# Patient Record
Sex: Male | Born: 1975 | Race: White | Hispanic: No | Smoking: Current every day smoker
Health system: Southern US, Community
[De-identification: ages and names within clinical notes are randomized; demographics above are authoritative.]

## PROBLEM LIST (undated history)

## (undated) DIAGNOSIS — J439 Emphysema, unspecified: Secondary | ICD-10-CM

## (undated) DIAGNOSIS — F17210 Nicotine dependence, cigarettes, uncomplicated: Secondary | ICD-10-CM

## (undated) DIAGNOSIS — C801 Malignant (primary) neoplasm, unspecified: Secondary | ICD-10-CM

## (undated) DIAGNOSIS — F988 Other specified behavioral and emotional disorders with onset usually occurring in childhood and adolescence: Secondary | ICD-10-CM

## (undated) DIAGNOSIS — J449 Chronic obstructive pulmonary disease, unspecified: Secondary | ICD-10-CM

## (undated) DIAGNOSIS — E669 Obesity, unspecified: Secondary | ICD-10-CM

## (undated) DIAGNOSIS — J45909 Unspecified asthma, uncomplicated: Secondary | ICD-10-CM

## (undated) HISTORY — DX: Other specified behavioral and emotional disorders with onset usually occurring in childhood and adolescence: F98.8

## (undated) HISTORY — PX: ANKLE SURGERY: SHX546

## (undated) HISTORY — PX: OTHER SURGICAL HISTORY: SHX169

---

## 1999-04-02 ENCOUNTER — Emergency Department (HOSPITAL_COMMUNITY): Admission: EM | Admit: 1999-04-02 | Discharge: 1999-04-02 | Payer: Self-pay

## 2000-11-22 ENCOUNTER — Encounter: Admission: RE | Admit: 2000-11-22 | Discharge: 2000-11-22 | Payer: Self-pay | Admitting: Family Medicine

## 2000-11-22 ENCOUNTER — Encounter: Payer: Self-pay | Admitting: Family Medicine

## 2002-03-09 ENCOUNTER — Emergency Department (HOSPITAL_COMMUNITY): Admission: EM | Admit: 2002-03-09 | Discharge: 2002-03-09 | Payer: Self-pay | Admitting: Emergency Medicine

## 2002-03-09 ENCOUNTER — Encounter: Payer: Self-pay | Admitting: Emergency Medicine

## 2004-03-30 ENCOUNTER — Emergency Department (HOSPITAL_COMMUNITY): Admission: EM | Admit: 2004-03-30 | Discharge: 2004-03-31 | Payer: Self-pay | Admitting: Emergency Medicine

## 2004-04-17 ENCOUNTER — Emergency Department (HOSPITAL_COMMUNITY): Admission: AC | Admit: 2004-04-17 | Discharge: 2004-04-18 | Payer: Self-pay

## 2007-05-05 ENCOUNTER — Emergency Department (HOSPITAL_COMMUNITY): Admission: EM | Admit: 2007-05-05 | Discharge: 2007-05-05 | Payer: Self-pay | Admitting: Emergency Medicine

## 2007-11-09 ENCOUNTER — Observation Stay (HOSPITAL_COMMUNITY): Admission: EM | Admit: 2007-11-09 | Discharge: 2007-11-10 | Payer: Self-pay | Admitting: Emergency Medicine

## 2007-11-18 ENCOUNTER — Emergency Department (HOSPITAL_COMMUNITY): Admission: EM | Admit: 2007-11-18 | Discharge: 2007-11-18 | Payer: Self-pay | Admitting: Emergency Medicine

## 2008-01-06 ENCOUNTER — Emergency Department (HOSPITAL_COMMUNITY): Admission: EM | Admit: 2008-01-06 | Discharge: 2008-01-06 | Payer: Self-pay | Admitting: Pediatrics

## 2008-02-15 ENCOUNTER — Emergency Department (HOSPITAL_COMMUNITY): Admission: EM | Admit: 2008-02-15 | Discharge: 2008-02-15 | Payer: Self-pay | Admitting: Emergency Medicine

## 2008-02-17 ENCOUNTER — Emergency Department (HOSPITAL_COMMUNITY): Admission: EM | Admit: 2008-02-17 | Discharge: 2008-02-17 | Payer: Self-pay | Admitting: Emergency Medicine

## 2009-05-11 ENCOUNTER — Observation Stay (HOSPITAL_COMMUNITY): Admission: EM | Admit: 2009-05-11 | Discharge: 2009-05-12 | Payer: Self-pay | Admitting: Emergency Medicine

## 2009-10-27 ENCOUNTER — Emergency Department (HOSPITAL_COMMUNITY): Admission: EM | Admit: 2009-10-27 | Discharge: 2009-10-28 | Payer: Self-pay | Admitting: Emergency Medicine

## 2010-05-03 ENCOUNTER — Ambulatory Visit: Payer: Self-pay | Admitting: Diagnostic Radiology

## 2010-05-03 ENCOUNTER — Emergency Department (HOSPITAL_BASED_OUTPATIENT_CLINIC_OR_DEPARTMENT_OTHER): Admission: EM | Admit: 2010-05-03 | Discharge: 2010-05-03 | Payer: Self-pay | Admitting: Emergency Medicine

## 2010-05-09 ENCOUNTER — Emergency Department (HOSPITAL_COMMUNITY): Admission: EM | Admit: 2010-05-09 | Discharge: 2010-05-09 | Payer: Self-pay | Admitting: Emergency Medicine

## 2010-08-20 ENCOUNTER — Inpatient Hospital Stay (HOSPITAL_COMMUNITY): Admission: EM | Admit: 2010-08-20 | Discharge: 2010-08-24 | Payer: Self-pay | Admitting: Emergency Medicine

## 2010-08-24 DIAGNOSIS — IMO0002 Reserved for concepts with insufficient information to code with codable children: Secondary | ICD-10-CM

## 2010-12-26 LAB — GLUCOSE, CAPILLARY
Glucose-Capillary: 107 mg/dL — ABNORMAL HIGH (ref 70–99)
Glucose-Capillary: 120 mg/dL — ABNORMAL HIGH (ref 70–99)
Glucose-Capillary: 126 mg/dL — ABNORMAL HIGH (ref 70–99)
Glucose-Capillary: 88 mg/dL (ref 70–99)
Glucose-Capillary: 96 mg/dL (ref 70–99)
Glucose-Capillary: 97 mg/dL (ref 70–99)

## 2010-12-26 LAB — BLOOD GAS, ARTERIAL
Acid-Base Excess: 1.7 mmol/L (ref 0.0–2.0)
Bicarbonate: 27.3 mEq/L — ABNORMAL HIGH (ref 20.0–24.0)
FIO2: 0.21 %
O2 Content: 4 L/min
O2 Saturation: 94.4 %
TCO2: 29.2 mmol/L (ref 0–100)
pCO2 arterial: 48.1 mmHg — ABNORMAL HIGH (ref 35.0–45.0)
pCO2 arterial: 57.1 mmHg (ref 35.0–45.0)
pO2, Arterial: 59.9 mmHg — ABNORMAL LOW (ref 80.0–100.0)
pO2, Arterial: 78.2 mmHg — ABNORMAL LOW (ref 80.0–100.0)

## 2010-12-26 LAB — BASIC METABOLIC PANEL
BUN: 11 mg/dL (ref 6–23)
BUN: 16 mg/dL (ref 6–23)
BUN: 8 mg/dL (ref 6–23)
CO2: 28 mEq/L (ref 19–32)
Calcium: 8.4 mg/dL (ref 8.4–10.5)
Chloride: 106 mEq/L (ref 96–112)
Chloride: 107 mEq/L (ref 96–112)
Creatinine, Ser: 1.17 mg/dL (ref 0.4–1.5)
Creatinine, Ser: 1.76 mg/dL — ABNORMAL HIGH (ref 0.4–1.5)
GFR calc Af Amer: >60 mL/min (ref >60)
GFR calc Af Amer: >60 mL/min (ref >60)
GFR calc non Af Amer: >60 mL/min (ref >60)
GFR calc non Af Amer: >60 mL/min (ref >60)
Glucose, Bld: 116 mg/dL — ABNORMAL HIGH (ref 70–99)
Potassium: 3.3 mEq/L — ABNORMAL LOW (ref 3.5–5.1)
Potassium: 4.3 mEq/L (ref 3.5–5.1)
Potassium: 5 mEq/L (ref 3.5–5.1)
Sodium: 140 mEq/L (ref 135–145)
Sodium: 141 mEq/L (ref 135–145)

## 2010-12-26 LAB — CBC
HCT: 43.6 % (ref 39.0–52.0)
MCH: 29.5 pg (ref 26.0–34.0)
MCH: 29.9 pg (ref 26.0–34.0)
MCHC: 31.5 g/dL (ref 30.0–36.0)
MCV: 93.2 fL (ref 78.0–100.0)
Platelets: 191 10*3/uL (ref 150–400)
Platelets: 214 10*3/uL (ref 150–400)
Platelets: 320 10*3/uL (ref 150–400)
RBC: 4.27 MIL/uL (ref 4.22–5.81)
RDW: 13.7 % (ref 11.5–15.5)
RDW: 13.7 % (ref 11.5–15.5)
WBC: 10.1 10*3/uL (ref 4.0–10.5)
WBC: 18.5 10*3/uL — ABNORMAL HIGH (ref 4.0–10.5)
WBC: 32.2 10*3/uL — ABNORMAL HIGH (ref 4.0–10.5)

## 2010-12-26 LAB — COMPREHENSIVE METABOLIC PANEL
AST: 54 U/L — ABNORMAL HIGH (ref 0–37)
CO2: 22 mEq/L (ref 19–32)
Calcium: 9.2 mg/dL (ref 8.4–10.5)
Creatinine, Ser: 2.62 mg/dL — ABNORMAL HIGH (ref 0.4–1.5)
GFR calc Af Amer: 34 mL/min — ABNORMAL LOW (ref >60)
GFR calc non Af Amer: 29 mL/min — ABNORMAL LOW (ref >60)
Sodium: 149 mEq/L — ABNORMAL HIGH (ref 135–145)
Total Protein: 9.2 g/dL — ABNORMAL HIGH (ref 6.0–8.3)

## 2010-12-26 LAB — DIFFERENTIAL
Basophils Relative: 0 % (ref 0–1)
Eosinophils Absolute: 0 10*3/uL (ref 0.0–0.7)
Lymphs Abs: 3.2 10*3/uL (ref 0.7–4.0)
Monocytes Absolute: 1 10*3/uL (ref 0.1–1.0)
Neutro Abs: 28 10*3/uL — ABNORMAL HIGH (ref 1.7–7.7)
Neutrophils Relative %: 87 % — ABNORMAL HIGH (ref 43–77)

## 2010-12-26 LAB — URINALYSIS, ROUTINE W REFLEX MICROSCOPIC
Bilirubin Urine: NEGATIVE
Specific Gravity, Urine: 1.02 (ref 1.005–1.030)
pH: 5.5 (ref 5.0–8.0)

## 2010-12-26 LAB — PROTIME-INR
INR: 1.07 (ref 0.00–1.49)
Prothrombin Time: 14.1 seconds (ref 11.6–15.2)

## 2010-12-26 LAB — URINE MICROSCOPIC-ADD ON

## 2010-12-26 LAB — ACETAMINOPHEN LEVEL: Acetaminophen (Tylenol), Serum: <10 ug/mL — ABNORMAL LOW (ref 10–30)

## 2010-12-26 LAB — LIPASE, BLOOD: Lipase: 35 U/L (ref 11–59)

## 2010-12-26 LAB — RAPID URINE DRUG SCREEN, HOSP PERFORMED
Amphetamines: NOT DETECTED
Cocaine: POSITIVE — AB
Opiates: NOT DETECTED
Tetrahydrocannabinol: POSITIVE — AB

## 2010-12-26 LAB — APTT: aPTT: 27 seconds (ref 24–37)

## 2010-12-26 LAB — LACTIC ACID, PLASMA: Lactic Acid, Venous: 11 mmol/L — ABNORMAL HIGH (ref 0.5–2.2)

## 2010-12-26 LAB — MRSA PCR SCREENING: MRSA by PCR: NEGATIVE

## 2010-12-26 LAB — BRAIN NATRIURETIC PEPTIDE: Pro B Natriuretic peptide (BNP): 76 pg/mL (ref 0.0–100.0)

## 2010-12-26 LAB — CK: Total CK: 134 U/L (ref 7–232)

## 2010-12-30 LAB — COMPREHENSIVE METABOLIC PANEL
AST: 17 U/L (ref 0–37)
Albumin: 4.3 g/dL (ref 3.5–5.2)
Alkaline Phosphatase: 110 U/L (ref 39–117)
BUN: 12 mg/dL (ref 6–23)
Chloride: 101 mEq/L (ref 96–112)
Creatinine, Ser: 1 mg/dL (ref 0.4–1.5)
GFR calc Af Amer: >60 mL/min (ref >60)
Potassium: 3.7 mEq/L (ref 3.5–5.1)
Total Bilirubin: 0.8 mg/dL (ref 0.3–1.2)
Total Protein: 8.2 g/dL (ref 6.0–8.3)

## 2010-12-30 LAB — DIFFERENTIAL
Basophils Absolute: 0.1 10*3/uL (ref 0.0–0.1)
Eosinophils Relative: 3 % (ref 0–5)
Lymphocytes Relative: 29 % (ref 12–46)
Monocytes Absolute: 0.5 10*3/uL (ref 0.1–1.0)
Monocytes Relative: 7 % (ref 3–12)
Neutro Abs: 4.6 10*3/uL (ref 1.7–7.7)

## 2010-12-30 LAB — POCT CARDIAC MARKERS
CKMB, poc: <1 ng/mL — ABNORMAL LOW (ref 1.0–8.0)
Myoglobin, poc: 45.9 ng/mL (ref 12–200)

## 2010-12-30 LAB — CBC
Platelets: 253 10*3/uL (ref 150–400)
RBC: 5.25 MIL/uL (ref 4.22–5.81)
RDW: 12.4 % (ref 11.5–15.5)
WBC: 7.6 10*3/uL (ref 4.0–10.5)

## 2011-01-21 LAB — DIFFERENTIAL
Basophils Absolute: 0 10*3/uL (ref 0.0–0.1)
Basophils Relative: 1 % (ref 0–1)
Lymphocytes Relative: 24 % (ref 12–46)
Monocytes Absolute: 0.8 10*3/uL (ref 0.1–1.0)
Neutro Abs: 5.2 10*3/uL (ref 1.7–7.7)
Neutrophils Relative %: 61 % (ref 43–77)

## 2011-01-21 LAB — CBC
Hemoglobin: 13.1 g/dL (ref 13.0–17.0)
MCHC: 34.1 g/dL (ref 30.0–36.0)
Platelets: 213 10*3/uL (ref 150–400)
RDW: 12.6 % (ref 11.5–15.5)

## 2011-01-21 LAB — BASIC METABOLIC PANEL
BUN: 12 mg/dL (ref 6–23)
CO2: 29 mEq/L (ref 19–32)
Calcium: 9.1 mg/dL (ref 8.4–10.5)
Creatinine, Ser: 1.26 mg/dL (ref 0.4–1.5)
GFR calc non Af Amer: >60 mL/min (ref >60)
Glucose, Bld: 104 mg/dL — ABNORMAL HIGH (ref 70–99)

## 2011-01-21 LAB — CULTURE, BLOOD (ROUTINE X 2)
Culture: NO GROWTH
Culture: NO GROWTH

## 2011-02-27 NOTE — Op Note (Signed)
NAME:  KAPENA, HAMME NO.:  [id number]   MEDICAL RECORD NO.:  [id number]          PATIENT TYPE:  INP   LOCATION:  2550                         FACILITY:  [facility]   PHYSICIAN:  Dyke Brackett, M.D.    DATE OF BIRTH:  Mar 20, 1976   DATE OF PROCEDURE:  11/09/2007  DATE OF DISCHARGE:                               OPERATIVE REPORT   PREOPERATIVE DIAGNOSES:  1. Displaced Weber type C fibular fracture.  2. Disruption of syndesmosis and deltoid.   POSTOPERATIVE DIAGNOSES:  1. Displaced Weber type C fibular fracture.  2. Disruption of syndesmosis and deltoid.   OPERATION PERFORMED:  1. Open reduction and internal fixation of Weber type C fibular      fracture.  2. Insertion of two syndesmotic screws.   SURGEON:  Dyke Brackett, M.D.   ASSISTANT:  Arlys John D. Petrarca, P. A.-C.   TOURNIQUET TIME:  Approximately 1 hour.   DESCRIPTION OF THE OPERATION:  Provisional reduction of this fracture  dislocation had been done in the emergency room.  There was residual  widening of the medial space of the medial deltoid with good improvement  though on the provisional reduction.  He was approached through an  extended lateral incision due to the high nature of the fibular  fracture.  The fibula was exposed.  There was an oblique fracture, which  was comminuted enough that I did not think interfragmentary screw  compression was with a good idea.  Approximately a 9-10 hole plate was  placed.  One to two holes on the plate could not be used due to  comminution around the fracture site with excellent purchase probably at  least three screws above the fracture site and two cortical and one  cancellus screw distal to the fracture site were placed.  This  provisionally reduced the fracture very nicely with excellent  improvement.  There was a small trimalleolar component of this actually  that was __________  with a small posterior lip fracture that did not  require fixation.   Once  the plate was fixed and shown to be in good position two 4.5  cortical screws were placed at the level about the old fascial scar,  the first one, and then the second one above that to have reduction of  the syndesmosis as well as complete resolution relative to the medial  deltoid space being equalized on the mortise view.  The wound was  irrigated.  The sheath over the peroneal tendons, which had been opened  for exposure of the fracture, was closed with interrupted 0-Vicryl, in  the subcutaneous tissue 2-0 Vicryl and skin clips.  Marcaine with  epinephrine was infiltrated in the skin.  A bulky light compressive  sterile dressing with posterior splint were applied.   The patient was taken to recovery room in stable condition.   C-arm fluoroscopy confirmed essentially an anatomic reduction in AP,  mortise and lateral views.   The tourniquet was released after the surgery and before application of  the dressing.      Dyke Brackett, M.D.  Electronically Signed  WDC/MEDQ  D:  11/09/2007  T:  11/10/2007  Job:  [id number]

## 2011-07-05 LAB — BASIC METABOLIC PANEL
CO2: 33 — ABNORMAL HIGH
Calcium: 8.6
Creatinine, Ser: 1.05
Glucose, Bld: 116 — ABNORMAL HIGH

## 2011-07-05 LAB — I-STAT 8, (EC8 V) (CONVERTED LAB)
Chloride: 104
Glucose, Bld: 98
Potassium: 3.9
TCO2: 30
pCO2, Ven: 47.9
pH, Ven: 7.391 — ABNORMAL HIGH

## 2011-07-05 LAB — RAPID URINE DRUG SCREEN, HOSP PERFORMED
Barbiturates: NOT DETECTED
Benzodiazepines: POSITIVE — AB
Cocaine: NOT DETECTED

## 2011-07-05 LAB — POCT I-STAT CREATININE: Operator id: 272551

## 2011-07-05 LAB — CBC
MCHC: 33.8
MCHC: 34
MCV: 86.3
Platelets: 226
RBC: 5
RDW: 13.5
RDW: 13.5

## 2011-07-30 LAB — URINALYSIS, ROUTINE W REFLEX MICROSCOPIC
Glucose, UA: >1000 — AB
Leukocytes, UA: NEGATIVE
Protein, ur: NEGATIVE
Urobilinogen, UA: 0.2

## 2011-07-30 LAB — POCT I-STAT CREATININE: Operator id: 146091

## 2011-07-30 LAB — HEPATIC FUNCTION PANEL
Albumin: 3.7
Alkaline Phosphatase: 91
Total Protein: 7.7

## 2011-07-30 LAB — I-STAT 8, (EC8 V) (CONVERTED LAB)
Bicarbonate: 29.2 — ABNORMAL HIGH
Glucose, Bld: 72
TCO2: 31
pCO2, Ven: 59.8 — ABNORMAL HIGH
pH, Ven: 7.297

## 2011-07-30 LAB — RAPID URINE DRUG SCREEN, HOSP PERFORMED
Amphetamines: NOT DETECTED
Benzodiazepines: POSITIVE — AB
Tetrahydrocannabinol: POSITIVE — AB

## 2011-07-30 LAB — ETHANOL: Alcohol, Ethyl (B): <5

## 2011-07-30 LAB — URINE MICROSCOPIC-ADD ON

## 2012-03-26 ENCOUNTER — Emergency Department (HOSPITAL_COMMUNITY): Payer: Self-pay

## 2012-03-26 ENCOUNTER — Encounter (HOSPITAL_COMMUNITY): Payer: Self-pay | Admitting: Emergency Medicine

## 2012-03-26 ENCOUNTER — Emergency Department (HOSPITAL_COMMUNITY)
Admission: EM | Admit: 2012-03-26 | Discharge: 2012-03-26 | Disposition: A | Discharge status: Home / Self Care | Payer: No Typology Code available for payment source | Attending: Emergency Medicine | Admitting: Emergency Medicine

## 2012-03-26 ENCOUNTER — Emergency Department (HOSPITAL_COMMUNITY): Payer: No Typology Code available for payment source

## 2012-03-26 DIAGNOSIS — S81012A Laceration without foreign body, left knee, initial encounter: Secondary | ICD-10-CM

## 2012-03-26 DIAGNOSIS — M79609 Pain in unspecified limb: Secondary | ICD-10-CM | POA: Insufficient documentation

## 2012-03-26 DIAGNOSIS — T07XXXA Unspecified multiple injuries, initial encounter: Secondary | ICD-10-CM

## 2012-03-26 DIAGNOSIS — S81809A Unspecified open wound, unspecified lower leg, initial encounter: Secondary | ICD-10-CM | POA: Insufficient documentation

## 2012-03-26 DIAGNOSIS — F1010 Alcohol abuse, uncomplicated: Secondary | ICD-10-CM | POA: Insufficient documentation

## 2012-03-26 DIAGNOSIS — S81009A Unspecified open wound, unspecified knee, initial encounter: Secondary | ICD-10-CM | POA: Insufficient documentation

## 2012-03-26 DIAGNOSIS — R109 Unspecified abdominal pain: Secondary | ICD-10-CM | POA: Insufficient documentation

## 2012-03-26 DIAGNOSIS — E669 Obesity, unspecified: Secondary | ICD-10-CM | POA: Insufficient documentation

## 2012-03-26 DIAGNOSIS — Y92410 Unspecified street and highway as the place of occurrence of the external cause: Secondary | ICD-10-CM | POA: Insufficient documentation

## 2012-03-26 DIAGNOSIS — R51 Headache: Secondary | ICD-10-CM | POA: Insufficient documentation

## 2012-03-26 HISTORY — DX: Unspecified asthma, uncomplicated: J45.909

## 2012-03-26 LAB — PROTIME-INR: INR: 0.96 (ref 0.00–1.49)

## 2012-03-26 LAB — CBC
MCH: 28.8 pg (ref 26.0–34.0)
Platelets: 199 10*3/uL (ref 150–400)
RBC: 5.28 MIL/uL (ref 4.22–5.81)
WBC: 8.3 10*3/uL (ref 4.0–10.5)

## 2012-03-26 LAB — BASIC METABOLIC PANEL
Calcium: 9.4 mg/dL (ref 8.4–10.5)
GFR calc Af Amer: >90 mL/min (ref >90)
GFR calc non Af Amer: >90 mL/min (ref >90)
Sodium: 135 mEq/L (ref 135–145)

## 2012-03-26 LAB — LACTIC ACID, PLASMA: Lactic Acid, Venous: 4.5 mmol/L — ABNORMAL HIGH (ref 0.5–2.2)

## 2012-03-26 LAB — ETHANOL: Alcohol, Ethyl (B): 190 mg/dL — ABNORMAL HIGH (ref 0–11)

## 2012-03-26 MED ORDER — IOHEXOL 300 MG/ML  SOLN
100.0000 mL | Freq: Once | INTRAMUSCULAR | Status: AC | PRN
  Administered 2012-03-26: 100 mL via INTRAVENOUS

## 2012-03-26 MED ORDER — MORPHINE SULFATE 4 MG/ML IJ SOLN
4.0000 mg | Freq: Once | INTRAMUSCULAR | Status: AC
  Administered 2012-03-26: 4 mg via INTRAVENOUS
  Filled 2012-03-26: qty 1

## 2012-03-26 MED ORDER — ONDANSETRON HCL 4 MG/2ML IJ SOLN
INTRAMUSCULAR | Status: AC
  Filled 2012-03-26: qty 2

## 2012-03-26 MED ORDER — TETANUS-DIPHTH-ACELL PERTUSSIS 5-2.5-18.5 LF-MCG/0.5 IM SUSP
INTRAMUSCULAR | Status: AC
  Administered 2012-03-26: 0.5 mL via INTRAMUSCULAR
  Filled 2012-03-26: qty 0.5

## 2012-03-26 MED ORDER — LORAZEPAM 1 MG PO TABS: 1.0000 mg | ORAL_TABLET | Freq: Once | ORAL | Status: DC

## 2012-03-26 MED ORDER — OXYCODONE-ACETAMINOPHEN 5-325 MG PO TABS: 2.0000 | ORAL_TABLET | ORAL | Status: AC | PRN

## 2012-03-26 MED ORDER — ONDANSETRON HCL 4 MG/2ML IJ SOLN
4.0000 mg | Freq: Once | INTRAMUSCULAR | Status: AC
  Administered 2012-03-26: 4 mg via INTRAVENOUS

## 2012-03-26 MED ORDER — LORAZEPAM 2 MG/ML IJ SOLN
1.0000 mg | Freq: Once | INTRAMUSCULAR | Status: AC
  Administered 2012-03-26 (×2): 1 mg via INTRAVENOUS

## 2012-03-26 MED ORDER — CEPHALEXIN 500 MG PO CAPS: 500.0000 mg | ORAL_CAPSULE | Freq: Four times a day (QID) | ORAL | Status: AC

## 2012-03-26 MED ORDER — LORAZEPAM 2 MG/ML IJ SOLN
INTRAMUSCULAR | Status: AC
  Administered 2012-03-26: 1 mg via INTRAVENOUS
  Filled 2012-03-26: qty 1

## 2012-03-26 MED ORDER — OXYCODONE-ACETAMINOPHEN 5-325 MG PO TABS
2.0000 | ORAL_TABLET | Freq: Once | ORAL | Status: AC
  Administered 2012-03-26: 2 via ORAL
  Filled 2012-03-26: qty 2

## 2012-03-26 MED ORDER — KETOROLAC TROMETHAMINE 30 MG/ML IJ SOLN
30.0000 mg | Freq: Once | INTRAMUSCULAR | Status: AC
  Administered 2012-03-26: 30 mg via INTRAVENOUS
  Filled 2012-03-26: qty 1

## 2012-03-26 MED ORDER — TETANUS-DIPHTH-ACELL PERTUSSIS 5-2.5-18.5 LF-MCG/0.5 IM SUSP
0.5000 mL | Freq: Once | INTRAMUSCULAR | Status: AC
  Administered 2012-03-26: 0.5 mL via INTRAMUSCULAR
  Filled 2012-03-26: qty 0.5

## 2012-03-26 MED ORDER — METHYLENE BLUE 1 % INJ SOLN
1.0000 mL | Freq: Once | INTRAMUSCULAR | Status: AC
  Administered 2012-03-26: 10 mg via INTRAVENOUS

## 2012-03-26 MED ORDER — METHYLENE BLUE 1 % INJ SOLN
1.0000 mL | Freq: Once | INTRAMUSCULAR | Status: DC
  Filled 2012-03-26 (×2): qty 10

## 2012-03-26 MED ORDER — ONDANSETRON 4 MG PO TBDP
8.0000 mg | ORAL_TABLET | Freq: Once | ORAL | Status: AC
  Administered 2012-03-26: 8 mg via ORAL
  Filled 2012-03-26: qty 2

## 2012-03-26 MED ORDER — HYDROMORPHONE HCL PF 1 MG/ML IJ SOLN
1.0000 mg | Freq: Once | INTRAMUSCULAR | Status: AC
  Administered 2012-03-26: 1 mg via INTRAVENOUS
  Filled 2012-03-26: qty 1
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
## 2012-03-26 NOTE — ED Provider Notes (Signed)
Medical screening examination/treatment/procedure(s) were performed by non-physician practitioner and as supervising physician I was immediately available for consultation/collaboration.  Olivia Mackie, MD 03/26/12 [phone]

## 2012-03-26 NOTE — Progress Notes (Signed)
Orthopedic Tech Progress Note Patient Details:  Danny Hale 1976/09/29 [id number]  Ortho Devices Type of Ortho Device: Knee Immobilizer;Crutches Ortho Device/Splint Interventions: Application;Adjustment   Shawnie Pons 03/26/2012, 7:45 AM

## 2012-03-26 NOTE — ED Provider Notes (Signed)
Danny Hale S Patient discussed with attending physician. Will assist with repair of laceration to left anterior knee.   LACERATION REPAIR Performed by: Angus Seller Authorized by: Angus Seller Consent: Verbal consent obtained. Risks and benefits: risks, benefits and alternatives were discussed Consent given by: patient Patient identity confirmed: provided demographic data Prepped and Draped in normal sterile fashion Wound explored  Laceration Location: Left inferior knee  Laceration Length: 8 cm  No Foreign Bodies seen or palpated  Anesthesia: local infiltration  Local anesthetic: lidocaine 2 % with epinephrine  Anesthetic total: 20 ml  Irrigation method: syringe with 2 L of fluid  Amount of cleaning: Extensive with addition of Betadine scrub   Skin closure: Subcutaneous with 3-0 Vicryl, Skin with 3-0 nylon   Number of sutures: 3 Vicryl, 10 nylon   Technique: Horizontal mattress and simple interrupted   Patient tolerance: Patient tolerated the procedure well with no immediate complications.   Angus Seller, [place] 03/26/12 [phone]

## 2012-04-18 ENCOUNTER — Emergency Department (HOSPITAL_COMMUNITY)
Admission: EM | Admit: 2012-04-18 | Discharge: 2012-04-18 | Disposition: A | Discharge status: Home / Self Care | Payer: No Typology Code available for payment source | Attending: Emergency Medicine | Admitting: Emergency Medicine

## 2012-04-18 ENCOUNTER — Emergency Department (HOSPITAL_COMMUNITY): Payer: No Typology Code available for payment source

## 2012-04-18 ENCOUNTER — Encounter (HOSPITAL_COMMUNITY): Payer: Self-pay | Admitting: Emergency Medicine

## 2012-04-18 DIAGNOSIS — F17200 Nicotine dependence, unspecified, uncomplicated: Secondary | ICD-10-CM | POA: Insufficient documentation

## 2012-04-18 DIAGNOSIS — S43499A Other sprain of unspecified shoulder joint, initial encounter: Secondary | ICD-10-CM | POA: Insufficient documentation

## 2012-04-18 DIAGNOSIS — X503XXA Overexertion from repetitive movements, initial encounter: Secondary | ICD-10-CM | POA: Insufficient documentation

## 2012-04-18 DIAGNOSIS — J45909 Unspecified asthma, uncomplicated: Secondary | ICD-10-CM | POA: Insufficient documentation

## 2012-04-18 DIAGNOSIS — T148XXA Other injury of unspecified body region, initial encounter: Secondary | ICD-10-CM

## 2012-04-18 DIAGNOSIS — S46819A Strain of other muscles, fascia and tendons at shoulder and upper arm level, unspecified arm, initial encounter: Secondary | ICD-10-CM | POA: Insufficient documentation

## 2012-04-18 MED ORDER — HYDROCODONE-ACETAMINOPHEN 5-325 MG PO TABS
1.0000 | ORAL_TABLET | Freq: Once | ORAL | Status: AC
  Administered 2012-04-18: 1 via ORAL
  Filled 2012-04-18: qty 1

## 2012-04-18 MED ORDER — HYDROCODONE-ACETAMINOPHEN 5-325 MG PO TABS: 1.0000 | ORAL_TABLET | ORAL | Status: AC | PRN

## 2012-04-18 NOTE — ED Notes (Signed)
Pt's acuity increased due to HR.  [name], EMT verified HR manually.

## 2012-04-18 NOTE — ED Notes (Signed)
Paged ortho tech for left wrist splint 
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
## 2012-04-18 NOTE — Progress Notes (Signed)
Orthopedic Tech Progress Note Patient Details:  Danny Hale 10-16-1975 [id number]  Ortho Devices Type of Ortho Device: Velcro wrist splint Ortho Device/Splint Location: (L) UE Ortho Device/Splint Interventions: Application   Jennye Moccasin 04/18/2012, 10:38 PM

## 2012-04-18 NOTE — ED Provider Notes (Signed)
Medical screening examination/treatment/procedure(s) were performed by non-physician practitioner and as supervising physician I was immediately available for consultation/collaboration.   Gwyneth Sprout, MD 04/18/12 [phone]
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
## 2013-01-04 ENCOUNTER — Emergency Department (HOSPITAL_COMMUNITY): Payer: Self-pay

## 2013-01-04 ENCOUNTER — Encounter (HOSPITAL_COMMUNITY): Payer: Self-pay | Admitting: *Deleted

## 2013-01-04 ENCOUNTER — Inpatient Hospital Stay (HOSPITAL_COMMUNITY)
Admission: EM | Admit: 2013-01-04 | Discharge: 2013-01-08 | DRG: 202 | Disposition: A | Discharge status: Home / Self Care | Payer: MEDICAID | Attending: Internal Medicine | Admitting: Internal Medicine

## 2013-01-04 DIAGNOSIS — E669 Obesity, unspecified: Secondary | ICD-10-CM

## 2013-01-04 DIAGNOSIS — R0902 Hypoxemia: Secondary | ICD-10-CM | POA: Diagnosis present

## 2013-01-04 DIAGNOSIS — F411 Generalized anxiety disorder: Secondary | ICD-10-CM | POA: Diagnosis present

## 2013-01-04 DIAGNOSIS — J45902 Unspecified asthma with status asthmaticus: Principal | ICD-10-CM | POA: Diagnosis present

## 2013-01-04 DIAGNOSIS — H6690 Otitis media, unspecified, unspecified ear: Secondary | ICD-10-CM | POA: Diagnosis present

## 2013-01-04 DIAGNOSIS — F17200 Nicotine dependence, unspecified, uncomplicated: Secondary | ICD-10-CM | POA: Diagnosis present

## 2013-01-04 DIAGNOSIS — E662 Morbid (severe) obesity with alveolar hypoventilation: Secondary | ICD-10-CM | POA: Diagnosis present

## 2013-01-04 DIAGNOSIS — J45901 Unspecified asthma with (acute) exacerbation: Secondary | ICD-10-CM

## 2013-01-04 DIAGNOSIS — Z79899 Other long term (current) drug therapy: Secondary | ICD-10-CM

## 2013-01-04 DIAGNOSIS — F3289 Other specified depressive episodes: Secondary | ICD-10-CM | POA: Diagnosis present

## 2013-01-04 DIAGNOSIS — H6691 Otitis media, unspecified, right ear: Secondary | ICD-10-CM

## 2013-01-04 DIAGNOSIS — K219 Gastro-esophageal reflux disease without esophagitis: Secondary | ICD-10-CM | POA: Diagnosis present

## 2013-01-04 DIAGNOSIS — Z720 Tobacco use: Secondary | ICD-10-CM

## 2013-01-04 DIAGNOSIS — G4733 Obstructive sleep apnea (adult) (pediatric): Secondary | ICD-10-CM | POA: Diagnosis present

## 2013-01-04 DIAGNOSIS — F329 Major depressive disorder, single episode, unspecified: Secondary | ICD-10-CM | POA: Diagnosis present

## 2013-01-04 DIAGNOSIS — R0602 Shortness of breath: Secondary | ICD-10-CM

## 2013-01-04 DIAGNOSIS — Z6841 Body Mass Index (BMI) 40.0 and over, adult: Secondary | ICD-10-CM

## 2013-01-04 DIAGNOSIS — R000 Tachycardia, unspecified: Secondary | ICD-10-CM | POA: Diagnosis present

## 2013-01-04 LAB — CBC WITH DIFFERENTIAL/PLATELET
Basophils Absolute: 0.1 10*3/uL (ref 0.0–0.1)
Basophils Relative: 1 % (ref 0–1)
Eosinophils Relative: 7 % — ABNORMAL HIGH (ref 0–5)
HCT: 40 % (ref 39.0–52.0)
Hemoglobin: 13.3 g/dL (ref 13.0–17.0)
MCH: 29.1 pg (ref 26.0–34.0)
MCHC: 33.3 g/dL (ref 30.0–36.0)
MCV: 87.5 fL (ref 78.0–100.0)
Monocytes Absolute: 0.5 10*3/uL (ref 0.1–1.0)
Monocytes Relative: 6 % (ref 3–12)
Neutro Abs: 3.8 10*3/uL (ref 1.7–7.7)
RDW: 13.5 % (ref 11.5–15.5)

## 2013-01-04 LAB — URINALYSIS, ROUTINE W REFLEX MICROSCOPIC
Leukocytes, UA: NEGATIVE
Protein, ur: NEGATIVE mg/dL
Specific Gravity, Urine: 1.022 (ref 1.005–1.030)
Urobilinogen, UA: 1 mg/dL (ref 0.0–1.0)

## 2013-01-04 LAB — COMPREHENSIVE METABOLIC PANEL
Albumin: 3.5 g/dL (ref 3.5–5.2)
BUN: 11 mg/dL (ref 6–23)
CO2: 28 mEq/L (ref 19–32)
Calcium: 8.8 mg/dL (ref 8.4–10.5)
Chloride: 106 mEq/L (ref 96–112)
Creatinine, Ser: 1.12 mg/dL (ref 0.50–1.35)
GFR calc non Af Amer: 84 mL/min — ABNORMAL LOW (ref >90)
Total Bilirubin: 0.2 mg/dL — ABNORMAL LOW (ref 0.3–1.2)

## 2013-01-04 LAB — PRO B NATRIURETIC PEPTIDE: Pro B Natriuretic peptide (BNP): 164.1 pg/mL — ABNORMAL HIGH (ref 0–125)

## 2013-01-04 MED ORDER — IPRATROPIUM BROMIDE 0.02 % IN SOLN
0.5000 mg | Freq: Once | RESPIRATORY_TRACT | Status: AC
  Administered 2013-01-04: 0.5 mg via RESPIRATORY_TRACT
  Filled 2013-01-04: qty 2.5

## 2013-01-04 MED ORDER — PREDNISONE 20 MG PO TABS
40.0000 mg | ORAL_TABLET | Freq: Once | ORAL | Status: AC
  Administered 2013-01-04: 40 mg via ORAL
  Filled 2013-01-04: qty 2

## 2013-01-04 MED ORDER — HYDROCODONE-HOMATROPINE 5-1.5 MG/5ML PO SYRP
5.0000 mL | ORAL_SOLUTION | Freq: Once | ORAL | Status: AC
  Administered 2013-01-04: 5 mL via ORAL
  Filled 2013-01-04: qty 5

## 2013-01-04 MED ORDER — AMOXICILLIN 500 MG PO CAPS
1000.0000 mg | ORAL_CAPSULE | Freq: Once | ORAL | Status: AC
  Administered 2013-01-04: 1000 mg via ORAL
  Filled 2013-01-04: qty 2

## 2013-01-04 MED ORDER — ALBUTEROL SULFATE (5 MG/ML) 0.5% IN NEBU
2.5000 mg | INHALATION_SOLUTION | Freq: Once | RESPIRATORY_TRACT | Status: AC
  Administered 2013-01-04: 2.5 mg via RESPIRATORY_TRACT
  Filled 2013-01-04: qty 0.5

## 2013-01-04 MED ORDER — HYDROCODONE-ACETAMINOPHEN 5-325 MG PO TABS
2.0000 | ORAL_TABLET | Freq: Once | ORAL | Status: AC
  Administered 2013-01-04: 2 via ORAL
  Filled 2013-01-04: qty 2

## 2013-01-04 NOTE — ED Notes (Signed)
Patient sats at 93% RA sitting on bedside, Stood patient sats droped to 88% RA. C/O some Shortness of breath. Ambulated him Sats dropped to low low 80 to high 70s % on room air. Ambulated him futher, sats dropped to mid to low 70's with shortness of breath. Informed Antony Madura PA of finding.

## 2013-01-04 NOTE — ED Notes (Signed)
Pt requesting cough syrup, PA notified.

## 2013-01-04 NOTE — ED Notes (Signed)
Pt reports he has had a cough and lost his voice along with some SOB, started 6 weeks ago. Pt also c/o left eye pain, no obvious injury or deformity seen. Pt in nad, skin warm and dry, resp e/u.

## 2013-01-04 NOTE — ED Notes (Signed)
The pt has been ill for approx 6 weeks .  He started with a cough productive at times. Temp intermittently headache back pain and cannot get rid of it

## 2013-01-04 NOTE — ED Provider Notes (Signed)
History     CSN: [id number]  Arrival date & time 01/04/13  1831   None     Chief Complaint  Patient presents with  . multiple symptoms     (Consider location/radiation/quality/duration/timing/severity/associated sxs/prior treatment) HPI Comments: Patient is a 37 year old male with a history of asthma who presents for a variety of complaints beginning 6 weeks ago. Patient states that his symptoms started as a productive cough with greenish brown phlegm and has since progressed to include right ear pain, left ear pressure, sore throat, and shortness of breath. Patient denies any aggravating factors of his symptoms. Patient has been treating his symptoms with Tylenol, Advil, Mucinex, and albuterol inhaler with only mild relief. Patient denies fevers, visual disturbances, tinnitus or hearing loss, chest pain, abdominal pain, nausea or vomiting, weakness, numbness or tingling in his extremities , or loss of consciousness. Patient does not have a primary care provider.  The history is provided by the patient. No language interpreter was used.    Past Medical History  Diagnosis Date  . Asthma     History reviewed. No pertinent past surgical history.  No family history on file.  History  Substance Use Topics  . Smoking status: Current Every Day Smoker  . Smokeless tobacco: Not on file  . Alcohol Use: Yes      Review of Systems  Constitutional: Positive for diaphoresis. Negative for fever and chills.  HENT: Positive for congestion and sore throat. Negative for neck pain, neck stiffness, tinnitus and ear discharge.   Eyes: Negative for visual disturbance.  Respiratory: Positive for cough, chest tightness and shortness of breath.   Cardiovascular: Negative for chest pain.  Gastrointestinal: Negative for nausea, vomiting, abdominal pain and diarrhea.  Genitourinary: Negative for dysuria and hematuria.  Musculoskeletal: Negative for back pain.  Skin: Negative for color change.   Neurological: Negative for syncope, weakness and numbness.  All other systems reviewed and are negative.    Allergies  Review of patient's allergies indicates no known allergies.  Home Medications   Current Outpatient Rx  Name  Route  Sig  Dispense  Refill  . albuterol (PROVENTIL HFA;VENTOLIN HFA) 108 (90 BASE) MCG/ACT inhaler   Inhalation   Inhale 2 puffs into the lungs every 6 (six) hours as needed. For shortness of breath         . DULoxetine (CYMBALTA) 60 MG capsule   Oral   Take 60 mg by mouth daily.         Marland Kitchen gabapentin (NEURONTIN) 100 MG capsule   Oral   Take 100 mg by mouth 4 (four) times daily as needed (for pain).         Marland Kitchen ibuprofen (ADVIL,MOTRIN) 200 MG tablet   Oral   Take 200-600 mg by mouth every 6 (six) hours as needed for pain, fever or headache.          . traZODone (DESYREL) 150 MG tablet   Oral   Take 300 mg by mouth at bedtime.         Marland Kitchen OVER THE COUNTER MEDICATION   Oral   Take 2 tablets by mouth 2 (two) times daily as needed. "Pain Aid" for pain relief           BP 133/74  Pulse 91  Temp(Src) 98.6 F (37 C) (Oral)  Resp 22  SpO2 100%  Physical Exam  Nursing note and vitals reviewed. Constitutional: He is oriented to person, place, and time. No distress.  Obese male  HENT:  Head: Normocephalic and atraumatic.  Mouth/Throat: No oropharyngeal exudate.  Tenderness on palpation of the right tragus and when pulling on the right auricle. Right tympanic membrane and ear canal injected and erythematous. No drainage or edema appreciated. No bulging or retraction of the right tympanic membrane appreciated. Left tympanic membrane bulging without perforation. Left ear canal normal. Oropharynx erythematous without edema. No tonsillar exudate appreciated.  Eyes: Conjunctivae and EOM are normal. Pupils are equal, round, and reactive to light. Right eye exhibits no discharge. Left eye exhibits no discharge. No scleral icterus.  Neck: Normal  range of motion. Neck supple.  Cardiovascular: Normal rate, regular rhythm, normal heart sounds and intact distal pulses.   Pulmonary/Chest: Effort normal. No respiratory distress. He has wheezes. He has no rales. He exhibits no tenderness.  Diffuse inspiratory and expiratory wheezing in left and right upper lower lung fields appreciated. No stridor.  Abdominal: Soft. He exhibits no distension. There is no tenderness.  Musculoskeletal: Normal range of motion.  Lymphadenopathy:    He has no cervical adenopathy.  Neurological: He is alert and oriented to person, place, and time.  Skin: Skin is warm and dry. He is not diaphoretic.  Psychiatric: He has a normal mood and affect. His behavior is normal.    ED Course  Procedures (including critical care time)  Labs Reviewed  CBC WITH DIFFERENTIAL - Abnormal; Notable for the following:    Eosinophils Relative 7 (*)    All other components within normal limits  COMPREHENSIVE METABOLIC PANEL - Abnormal; Notable for the following:    Glucose, Bld 112 (*)    Total Bilirubin 0.2 (*)    GFR calc non Af Amer 84 (*)    All other components within normal limits  PRO B NATRIURETIC PEPTIDE - Abnormal; Notable for the following:    Pro B Natriuretic peptide (BNP) 164.1 (*)    All other components within normal limits  URINALYSIS, ROUTINE W REFLEX MICROSCOPIC   Dg Chest 2 View  01/04/2013  *RADIOLOGY REPORT*  Clinical Data: Cough.  Congestion.  Asthma.  CHEST - 2 VIEW  Comparison: Multiple exams, including 03/26/2012  Findings: Old left rib fractures noted.  Mild cardiomegaly is observed, without edema.  There is linear subsegmental atelectasis or scarring at the left lung base.  No pleural effusion identified.  IMPRESSION:  1.  Linear subsegmental atelectasis or scarring at the left lung base. 2.  Old left rib fractures. 3.  Mild cardiomegaly, without edema.   Original Report Authenticated By: Gaylyn Rong, M.D.      1. Shortness of breath   2.  Otitis media, right      MDM  Patient presents for SOB with productive cough, R ear pain and L ear pressure as well as sore throat x 6 weeks. Patient has hx of asthma; wheezing is both inspiratory and expiratory and diffuse through all lung fields. R ear significant for otitis media. Patient will be worked up in ED with labs, BNP, D-dimer, and UA as well as a CXR. Neb tx, oral prednisone, hycodan, and Amox ordered for symptomatic relief.  Patient admits to some relief of SOB after two nebulizer tx. Patient's pulse ox checked with ambulation, dropping to 77 with heart rate in the 120s. IV steroids, magnesium sulfate, and continuous neb ordered as well as an ativan injection. Will reevaluate after treatments given. Patient work up and ED management discussed with Dr. Judd Lien who is in agreement.  Patient ambulated a second time after treatments with O2 sats  ranging from 76-93% on room air. SOB with audible expiratory wheeze and increased labored breathing. Dr. Rulon Abide consulted Internal Medicine who will admit the patient for further work up and evaluation. Patient well and nontoxic appearing while in ED with O2 sats stable ~94 while sitting in bed; not tachypneic. Will continue to monitor until brought to the floor.    Antony Madura, PA-C 01/06/13 0004

## 2013-01-05 ENCOUNTER — Encounter (HOSPITAL_COMMUNITY): Payer: Self-pay | Admitting: Internal Medicine

## 2013-01-05 DIAGNOSIS — J45901 Unspecified asthma with (acute) exacerbation: Secondary | ICD-10-CM | POA: Diagnosis present

## 2013-01-05 DIAGNOSIS — J45902 Unspecified asthma with status asthmaticus: Principal | ICD-10-CM

## 2013-01-05 DIAGNOSIS — R0602 Shortness of breath: Secondary | ICD-10-CM

## 2013-01-05 DIAGNOSIS — H6690 Otitis media, unspecified, unspecified ear: Secondary | ICD-10-CM

## 2013-01-05 LAB — BASIC METABOLIC PANEL
Calcium: 8.8 mg/dL (ref 8.4–10.5)
GFR calc Af Amer: >90 mL/min (ref >90)
GFR calc non Af Amer: >90 mL/min (ref >90)
Glucose, Bld: 217 mg/dL — ABNORMAL HIGH (ref 70–99)
Potassium: 3.1 mEq/L — ABNORMAL LOW (ref 3.5–5.1)
Sodium: 139 mEq/L (ref 135–145)

## 2013-01-05 LAB — MAGNESIUM: Magnesium: 2.4 mg/dL (ref 1.5–2.5)

## 2013-01-05 LAB — D-DIMER, QUANTITATIVE: D-Dimer, Quant: <0.27 ug/mL-FEU (ref 0.00–0.48)

## 2013-01-05 MED ORDER — METHYLPREDNISOLONE SODIUM SUCC 125 MG IJ SOLR
125.0000 mg | Freq: Once | INTRAMUSCULAR | Status: AC
  Administered 2013-01-05: 125 mg via INTRAVENOUS
  Filled 2013-01-05: qty 2

## 2013-01-05 MED ORDER — METHYLPREDNISOLONE SODIUM SUCC 125 MG IJ SOLR
60.0000 mg | Freq: Four times a day (QID) | INTRAMUSCULAR | Status: DC
  Administered 2013-01-05 – 2013-01-06 (×7): 60 mg via INTRAVENOUS
  Filled 2013-01-05 (×9): qty 0.96
  Filled 2013-01-05: qty 2
  Filled 2013-01-05: qty 0.96

## 2013-01-05 MED ORDER — LORAZEPAM 2 MG/ML IJ SOLN
1.0000 mg | Freq: Once | INTRAMUSCULAR | Status: AC
  Administered 2013-01-05: 1 mg via INTRAVENOUS
  Filled 2013-01-05: qty 1

## 2013-01-05 MED ORDER — DM-GUAIFENESIN ER 30-600 MG PO TB12
1.0000 | ORAL_TABLET | Freq: Two times a day (BID) | ORAL | Status: DC
  Administered 2013-01-05 – 2013-01-06 (×2): 1 via ORAL
  Administered 2013-01-06: 22:00:00 via ORAL
  Administered 2013-01-07 – 2013-01-08 (×3): 1 via ORAL
  Filled 2013-01-05 (×8): qty 1

## 2013-01-05 MED ORDER — GABAPENTIN 100 MG PO CAPS
100.0000 mg | ORAL_CAPSULE | Freq: Four times a day (QID) | ORAL | Status: DC | PRN
  Administered 2013-01-07: 100 mg via ORAL
  Filled 2013-01-05 (×3): qty 1

## 2013-01-05 MED ORDER — AMOXICILLIN 500 MG PO CAPS
500.0000 mg | ORAL_CAPSULE | Freq: Three times a day (TID) | ORAL | Status: DC
  Administered 2013-01-05 – 2013-01-08 (×11): 500 mg via ORAL
  Filled 2013-01-05 (×16): qty 1

## 2013-01-05 MED ORDER — ALBUTEROL SULFATE (5 MG/ML) 0.5% IN NEBU
2.5000 mg | INHALATION_SOLUTION | RESPIRATORY_TRACT | Status: DC
  Administered 2013-01-05 – 2013-01-06 (×5): 2.5 mg via RESPIRATORY_TRACT
  Filled 2013-01-05 (×4): qty 0.5

## 2013-01-05 MED ORDER — NICOTINE 21 MG/24HR TD PT24
21.0000 mg | MEDICATED_PATCH | Freq: Every day | TRANSDERMAL | Status: DC
  Administered 2013-01-05 – 2013-01-07 (×3): 21 mg via TRANSDERMAL
  Filled 2013-01-05 (×5): qty 1

## 2013-01-05 MED ORDER — ALBUTEROL SULFATE (5 MG/ML) 0.5% IN NEBU
2.5000 mg | INHALATION_SOLUTION | RESPIRATORY_TRACT | Status: DC | PRN
  Administered 2013-01-05 – 2013-01-08 (×4): 2.5 mg via RESPIRATORY_TRACT
  Filled 2013-01-05 (×5): qty 0.5

## 2013-01-05 MED ORDER — IBUPROFEN 200 MG PO TABS
200.0000 mg | ORAL_TABLET | Freq: Four times a day (QID) | ORAL | Status: DC | PRN
  Administered 2013-01-05 – 2013-01-07 (×6): 600 mg via ORAL
  Filled 2013-01-05 (×7): qty 3

## 2013-01-05 MED ORDER — DULOXETINE HCL 60 MG PO CPEP
60.0000 mg | ORAL_CAPSULE | Freq: Every day | ORAL | Status: DC
  Administered 2013-01-05 – 2013-01-08 (×4): 60 mg via ORAL
  Filled 2013-01-05 (×4): qty 1

## 2013-01-05 MED ORDER — POTASSIUM CHLORIDE CRYS ER 20 MEQ PO TBCR
40.0000 meq | EXTENDED_RELEASE_TABLET | Freq: Once | ORAL | Status: AC
  Administered 2013-01-05: 40 meq via ORAL
  Filled 2013-01-05: qty 2

## 2013-01-05 MED ORDER — ALBUTEROL (5 MG/ML) CONTINUOUS INHALATION SOLN
5.0000 mg/h | INHALATION_SOLUTION | RESPIRATORY_TRACT | Status: DC
  Administered 2013-01-05: 5 mg/h via RESPIRATORY_TRACT
  Filled 2013-01-05: qty 20

## 2013-01-05 MED ORDER — SODIUM CHLORIDE 0.9 % IJ SOLN
3.0000 mL | Freq: Two times a day (BID) | INTRAMUSCULAR | Status: DC
  Administered 2013-01-05 – 2013-01-08 (×8): 3 mL via INTRAVENOUS

## 2013-01-05 MED ORDER — TRAZODONE HCL 150 MG PO TABS
300.0000 mg | ORAL_TABLET | Freq: Every day | ORAL | Status: DC
  Administered 2013-01-05 – 2013-01-08 (×4): 300 mg via ORAL
  Filled 2013-01-05 (×4): qty 2
  Filled 2013-01-05: qty 6
  Filled 2013-01-05: qty 2

## 2013-01-05 MED ORDER — MAGNESIUM SULFATE 40 MG/ML IJ SOLN
2.0000 g | Freq: Once | INTRAMUSCULAR | Status: AC
  Administered 2013-01-05: 2 g via INTRAVENOUS
  Filled 2013-01-05: qty 50

## 2013-01-05 NOTE — Progress Notes (Signed)
[facility] PROGRESS NOTE  Danny Hale ZOX:[id number] DOB: 20-Nov-1975 DOA: 01/04/2013 PCP: Default, Provider, MD  Brief narrative  37 year old obese male admitted with that is asthmaticus.  Assessment/Plan: Acute severe asthma with status asthmaticus Patient failed multiple treatments in the ED including continuous nebulizer IV magnesium IV steroid with severe hypoxia on ambulation. Patient admitted to medical floor and currently getting scheduled IV Solu Medrol 60 mg every 6 hours. I have changed the nebs to schedule. Continue albuterol nebs every 2 hours when necessary as well -Gives history of smoking and have strongly counseled on cessation. -He is currently more stable however gets tachycardic on minimal exertion and O2 sats dropped to high 80s. -Monitor O2 sats.  Ear infection Started on amoxicillin on admission which will continue  Code Status: Full code Family Communication: Discussed with patient Disposition Plan: Home likely tomorrow if asthma symptoms improve  Consultants:  None  Procedures:  None  Antibiotics:  Amoxicillin started on 3/24  HPI/Subjective: Patient H&P reviewed. Patient informs his breathing to be slightly better. No triggering symptoms. Has had asthma since childhood last attack almost 2 years ago. Denies history of intubation.  Objective: Filed Vitals:   01/05/13 0215 01/05/13 0300 01/05/13 0652 01/05/13 1027  BP: 127/62 117/55 97/49   Pulse: 117 120 118   Temp:   98.1 F (36.7 C)   TempSrc:   Oral   Resp: 15 19 22    Height:   6\' 1"  (1.854 m)   Weight:   145.559 kg (320 lb 14.4 oz)   SpO2: 93% 93% 94% 93%    Intake/Output Summary (Last 24 hours) at 01/05/13 1221 Last data filed at 01/05/13 0900  Gross per 24 hour  Intake    363 ml  Output      0 ml  Net    363 ml   Filed Weights   01/05/13 0652  Weight: 145.559 kg (320 lb 14.4 oz)    Exam:   General:  Middle aged obese male in no acute distress  HEENT: No  pallor, moist oral mucosa  CVS: S1 and S2 tachycardic, no murmurs rub gallop  Respiratory: Bilateral equal air entry, few expiratory wheezes  Abdomen: Soft, nontender, nondistended, bowel sounds present  Extremities: Warm, no edema  CNS: AAO x3   Data Reviewed: Basic Metabolic Panel:  Recent Labs Lab 01/04/13 1835 01/05/13 0308 01/05/13 0946  NA 143 139  --   K 3.7 3.1*  --   CL 106 102  --   CO2 28 24  --   GLUCOSE 112* 217*  --   BUN 11 9  --   CREATININE 1.12 1.03  --   CALCIUM 8.8 8.8  --   MG  --   --  2.4   Liver Function Tests:  Recent Labs Lab 01/04/13 1835  AST 24  ALT 22  ALKPHOS 85  BILITOT 0.2*  PROT 7.1  ALBUMIN 3.5   No results found for this basename: LIPASE, AMYLASE,  in the last 168 hours No results found for this basename: AMMONIA,  in the last 168 hours CBC:  Recent Labs Lab 01/04/13 1835  WBC 7.1  NEUTROABS 3.8  HGB 13.3  HCT 40.0  MCV 87.5  PLT 209   Cardiac Enzymes: No results found for this basename: CKTOTAL, CKMB, CKMBINDEX, TROPONINI,  in the last 168 hours BNP (last 3 results)  Recent Labs  01/04/13 2214  PROBNP 164.1*   CBG: No results found for this basename: GLUCAP,  in the last 168 hours  No results found for this or any previous visit (from the past 240 hour(s)).   Studies: Dg Chest 2 View  01/04/2013  *RADIOLOGY REPORT*  Clinical Data: Cough.  Congestion.  Asthma.  CHEST - 2 VIEW  Comparison: Multiple exams, including 03/26/2012  Findings: Old left rib fractures noted.  Mild cardiomegaly is observed, without edema.  There is linear subsegmental atelectasis or scarring at the left lung base.  No pleural effusion identified.  IMPRESSION:  1.  Linear subsegmental atelectasis or scarring at the left lung base. 2.  Old left rib fractures. 3.  Mild cardiomegaly, without edema.   Original Report Authenticated By: Gaylyn Rong, M.D.     Scheduled Meds: . amoxicillin  500 mg Oral Q8H  . DULoxetine  60 mg Oral  Daily  . methylPREDNISolone (SOLU-MEDROL) injection  60 mg Intravenous Q6H  . sodium chloride  3 mL Intravenous Q12H  . traZODone  300 mg Oral QHS   Continuous Infusions: . albuterol Stopped (01/05/13 0100)      Time spent: 15 minutes    Ronnette Rump  [facility] Pager [phone] If 7PM-7AM, please contact night-coverage at [email or web address], password [facility] 01/05/2013, 12:21 PM  LOS: 1 day

## 2013-01-05 NOTE — H&P (Signed)
[facility] History and Physical  Danny Hale ZOX:[id number] DOB: 1976/02/17 DOA: 01/04/2013  Referring physician: ED PCP: Default, Provider, MD  Specialists: None  Chief Complaint: SOB  HPI: Danny Hale is a 37 y.o. male h/o asthma who presents with R ear pain, sore throat, and shortness of breath.  Has had cough productive of greenish sputum.  No fevers nor chills, severe SOB, asthma inhalor provides only minimal relief.  In the ED CXR demonstrated no infiltrate, he was found to have otitis media and treated with amoxicillin, his asthma exacerbation was not able to be improved despite an hour long neb treatment, steroids, and magnesium.  Patient is still desating to 75% on RA with ambulation.  Hospitalist admitting for persistent hypoxia.  Review of Systems: 12 systems reviewed and otherwise negative.  Past Medical History  Diagnosis Date  . Asthma    History reviewed. No pertinent past surgical history. Social History:  reports that he has been smoking.  He does not have any smokeless tobacco history on file. He reports that  drinks alcohol. He reports that he uses illicit drugs.   No Known Allergies  Family History  Problem Relation Age of Onset  . COPD Neg Hx      Prior to Admission medications   Medication Sig Start Date End Date Taking? Authorizing Provider  albuterol (PROVENTIL HFA;VENTOLIN HFA) 108 (90 BASE) MCG/ACT inhaler Inhale 2 puffs into the lungs every 6 (six) hours as needed. For shortness of breath   Yes Historical Provider, MD  DULoxetine (CYMBALTA) 60 MG capsule Take 60 mg by mouth daily.   Yes Historical Provider, MD  gabapentin (NEURONTIN) 100 MG capsule Take 100 mg by mouth 4 (four) times daily as needed (for pain).   Yes Historical Provider, MD  ibuprofen (ADVIL,MOTRIN) 200 MG tablet Take 200-600 mg by mouth every 6 (six) hours as needed for pain, fever or headache.    Yes Historical Provider, MD  traZODone (DESYREL) 150 MG tablet Take  300 mg by mouth at bedtime.   Yes Historical Provider, MD  OVER THE COUNTER MEDICATION Take 2 tablets by mouth 2 (two) times daily as needed. "Pain Aid" for pain relief    Historical Provider, MD   Physical Exam: Filed Vitals:   01/05/13 0130 01/05/13 0145 01/05/13 0200 01/05/13 0215  BP: 132/54 137/70 117/68 127/62  Pulse: 120 127 117 117  Temp:      TempSrc:      Resp: 17 18 17 15   SpO2: 95% 94% 96% 93%    General:  NAD, resting comfortably in bed Eyes: PEERLA EOMI ENT: mucous membranes moist Neck: supple w/o JVD Cardiovascular: RRR w/o MRG Respiratory: very wheezy bilaterally, acc muscle use noted Abdomen: soft, nt, nd, bs+ Skin: no rash nor lesion Musculoskeletal: MAE, full ROM all 4 extremities Psychiatric: normal tone and affect Neurologic: AAOx3, grossly non-focal  Labs on Admission:  Basic Metabolic Panel:  Recent Labs Lab 01/04/13 1835  NA 143  K 3.7  CL 106  CO2 28  GLUCOSE 112*  BUN 11  CREATININE 1.12  CALCIUM 8.8   Liver Function Tests:  Recent Labs Lab 01/04/13 1835  AST 24  ALT 22  ALKPHOS 85  BILITOT 0.2*  PROT 7.1  ALBUMIN 3.5   No results found for this basename: LIPASE, AMYLASE,  in the last 168 hours No results found for this basename: AMMONIA,  in the last 168 hours CBC:  Recent Labs Lab 01/04/13 1835  WBC 7.1  NEUTROABS 3.8  HGB 13.3  HCT 40.0  MCV 87.5  PLT 209   Cardiac Enzymes: No results found for this basename: CKTOTAL, CKMB, CKMBINDEX, TROPONINI,  in the last 168 hours  BNP (last 3 results)  Recent Labs  01/04/13 2214  PROBNP 164.1*   CBG: No results found for this basename: GLUCAP,  in the last 168 hours  Radiological Exams on Admission: Dg Chest 2 View  01/04/2013  *RADIOLOGY REPORT*  Clinical Data: Cough.  Congestion.  Asthma.  CHEST - 2 VIEW  Comparison: Multiple exams, including 03/26/2012  Findings: Old left rib fractures noted.  Mild cardiomegaly is observed, without edema.  There is linear  subsegmental atelectasis or scarring at the left lung base.  No pleural effusion identified.  IMPRESSION:  1.  Linear subsegmental atelectasis or scarring at the left lung base. 2.  Old left rib fractures. 3.  Mild cardiomegaly, without edema.   Original Report Authenticated By: Gaylyn Rong, M.D.     EKG: Independently reviewed.  Assessment/Plan Principal Problem:   Status asthmaticus Active Problems:   Asthma exacerbation   1. Asthma exacerbation status asthmaticus- failed multiple stabilizing treatments in ED including continuous nebulizer, magnesium IV, steroids, has some improvement at rest and stable for admission, but when he tries to ambulate severe hypoxia noted, ddx includes COPD (have ordered alpha-1-at), admitting patient with PRN nebs, continuing solumedrol 60mg  q6h 2. Ear infection - continuing amoxil 500mg  TID, got 1gm as first dose in ED. 3. Hypoxia - oxygen per protocol, hypoxia actually not that bad even on RA while resting (mid to low 90s), but desats to 75% when ambulatory.    Code Status: Full Code (must indicate code status--if unknown or must be presumed, indicate so) Family Communication: Spoke with wife at bedside (indicate person spoken with, if applicable, with phone number if by telephone) Disposition Plan: Admit to inpatient (indicate anticipated LOS)  Time spent: 70 min  GARDNER, JARED M. [facility] Pager [phone]  If 7PM-7AM, please contact night-coverage [email or web address] Password [facility] 01/05/2013, 2:46 AM

## 2013-01-05 NOTE — ED Notes (Signed)
Pt ambulated to nurses station and back. Pt SOB, audible expiratory wheezing and increased labor of breathing. Per dinamap pt pulse oximetry 76-93%/RA

## 2013-01-06 DIAGNOSIS — J45901 Unspecified asthma with (acute) exacerbation: Secondary | ICD-10-CM

## 2013-01-06 DIAGNOSIS — H6690 Otitis media, unspecified, unspecified ear: Secondary | ICD-10-CM

## 2013-01-06 DIAGNOSIS — R0602 Shortness of breath: Secondary | ICD-10-CM

## 2013-01-06 LAB — GLUCOSE, CAPILLARY: Glucose-Capillary: 147 mg/dL — ABNORMAL HIGH (ref 70–99)

## 2013-01-06 LAB — MAGNESIUM: Magnesium: 2.1 mg/dL (ref 1.5–2.5)

## 2013-01-06 MED ORDER — FUROSEMIDE 10 MG/ML IJ SOLN
INTRAMUSCULAR | Status: AC
  Filled 2013-01-06: qty 4

## 2013-01-06 MED ORDER — PREDNISONE 20 MG PO TABS
40.0000 mg | ORAL_TABLET | Freq: Every day | ORAL | Status: DC
  Administered 2013-01-07: 40 mg via ORAL
  Filled 2013-01-06 (×3): qty 2

## 2013-01-06 MED ORDER — HYDROCODONE-ACETAMINOPHEN 5-325 MG PO TABS
2.0000 | ORAL_TABLET | Freq: Once | ORAL | Status: AC
  Administered 2013-01-06: 2 via ORAL
  Filled 2013-01-06: qty 2

## 2013-01-06 MED ORDER — FUROSEMIDE 10 MG/ML IJ SOLN
20.0000 mg | Freq: Once | INTRAMUSCULAR | Status: AC
  Administered 2013-01-06: 20 mg via INTRAVENOUS

## 2013-01-06 MED ORDER — POTASSIUM CHLORIDE CRYS ER 20 MEQ PO TBCR
40.0000 meq | EXTENDED_RELEASE_TABLET | Freq: Two times a day (BID) | ORAL | Status: AC
  Administered 2013-01-06 – 2013-01-07 (×4): 40 meq via ORAL
  Filled 2013-01-06 (×4): qty 2

## 2013-01-06 MED ORDER — FUROSEMIDE 10 MG/ML IJ SOLN
20.0000 mg | Freq: Once | INTRAMUSCULAR | Status: AC
  Administered 2013-01-06: 20 mg via INTRAVENOUS
  Filled 2013-01-06: qty 2

## 2013-01-06 NOTE — Progress Notes (Deleted)
Pt ambulated in the hall, o2 sat dropped to the higher 80's. At rest his oxygen back to the lowe 90's on room air. Will continue to monitor.

## 2013-01-06 NOTE — Progress Notes (Signed)
[facility] PROGRESS NOTE  Danny Hale ZOX:[id number] DOB: [birth year] DOA: 01/04/2013 PCP: Default, Provider, MD  Brief narrative  37 year old obese male admitted with that is asthmaticus.  Assessment/Plan: Acute severe asthma with status asthmaticus Patient failed multiple treatments in the ED including continuous nebulizer IV magnesium IV steroid with severe hypoxia on ambulation. Patient admitted to medical floor and currently getting scheduled IV Solu Medrol 60 mg every 6 hours. I have changed the nebs to schedule. Continue albuterol nebs every 2 hours when necessary as well -Gives history of smoking and have strongly counseled on cessation. -He is currently more stable however gets tachycardic on minimal exertion and O2 sats dropped to high 80s. Will get 2D echo, he may have diastolic dysfunction so will give one dose of IV lasix 20 mg Will check i's and o's  Ear infection Started on amoxicillin on admission which will continue  Code Status: Full code Family Communication: Discussed with patient Disposition Plan: Home when stable  Consultants:  None  Procedures:  None  Antibiotics:  Amoxicillin started on 3/24  HPI/Subjective: Patient H&P reviewed. Patient says his breathing is better, but O2 sats dropped on walking.  Objective: Filed Vitals:   01/06/13 0500 01/06/13 0949 01/06/13 1254 01/06/13 1300  BP: 116/64     Pulse: 101  187   Temp: 97.7 F (36.5 C)     TempSrc:      Resp: 18     Height:      Weight:      SpO2: 96% 92% 85% 94%    Intake/Output Summary (Last 24 hours) at 01/06/13 1819 Last data filed at 01/06/13 1248  Gross per 24 hour  Intake    663 ml  Output   2850 ml  Net  -2187 ml   Filed Weights   01/05/13 0652  Weight: 145.559 kg (320 lb 14.4 oz)    Exam:   General:  Appear in no acute distress  HEENT: No pallor, moist oral mucosa  CVS: S1 and S2 tachycardic, no murmurs rub gallop  Respiratory: Bilateral equal air  entry  Abdomen: Soft, nontender, nondistended,   Extremities: No edema  CNS: AAO x3, no focal defecit   Data Reviewed: Basic Metabolic Panel:  Recent Labs Lab 01/04/13 1835 01/05/13 0308 01/05/13 0946  NA 143 139  --   K 3.7 3.1*  --   CL 106 102  --   CO2 28 24  --   GLUCOSE 112* 217*  --   BUN 11 9  --   CREATININE 1.12 1.03  --   CALCIUM 8.8 8.8  --   MG  --   --  2.4   Liver Function Tests:  Recent Labs Lab 01/04/13 1835  AST 24  ALT 22  ALKPHOS 85  BILITOT 0.2*  PROT 7.1  ALBUMIN 3.5   No results found for this basename: LIPASE, AMYLASE,  in the last 168 hours No results found for this basename: AMMONIA,  in the last 168 hours CBC:  Recent Labs Lab 01/04/13 1835  WBC 7.1  NEUTROABS 3.8  HGB 13.3  HCT 40.0  MCV 87.5  PLT 209   Cardiac Enzymes: No results found for this basename: CKTOTAL, CKMB, CKMBINDEX, TROPONINI,  in the last 168 hours BNP (last 3 results)  Recent Labs  01/04/13 2214  PROBNP 164.1*   CBG:  Recent Labs Lab 01/06/13 0738  GLUCAP 147*    No results found for this or any previous visit (from the  past 240 hour(s)).   Studies: Dg Chest 2 View  01/04/2013  *RADIOLOGY REPORT*  Clinical Data: Cough.  Congestion.  Asthma.  CHEST - 2 VIEW  Comparison: Multiple exams, including 03/26/2012  Findings: Old left rib fractures noted.  Mild cardiomegaly is observed, without edema.  There is linear subsegmental atelectasis or scarring at the left lung base.  No pleural effusion identified.  IMPRESSION:  1.  Linear subsegmental atelectasis or scarring at the left lung base. 2.  Old left rib fractures. 3.  Mild cardiomegaly, without edema.   Original Report Authenticated By: Gaylyn Rong, M.D.     Scheduled Meds: . amoxicillin  500 mg Oral Q8H  . dextromethorphan-guaiFENesin  1 tablet Oral BID  . DULoxetine  60 mg Oral Daily  . methylPREDNISolone (SOLU-MEDROL) injection  60 mg Intravenous Q6H  . nicotine  21 mg Transdermal Daily   . potassium chloride  40 mEq Oral BID  . sodium chloride  3 mL Intravenous Q12H  . traZODone  300 mg Oral QHS   Continuous Infusions:    Time spent: 25  minutes    [facility]  [facility] Pager [phone] If 7PM-7AM, please contact night-coverage at [email or web address], password [facility] 01/06/2013, 6:19 PM  LOS: 2 days

## 2013-01-06 NOTE — ED Provider Notes (Signed)
Medical screening examination/treatment/procedure(s) were conducted as a shared visit with non-physician practitioner(s) and myself.  I personally evaluated the patient during the encounter.  The patients with a several week history of progressive cough, wheezing, and shortness of breath.  He has a history of asthma that has been stable until this illness, and does not have any inhaler or medications.  On exam, the vitals are stable and the patient is afebrile.  The heart is regular rate and rhythm.  There are scattered wheezes present but he is in no respiratory distress.  The abdomen is benign and the legs are without edema.    He was given nebs and po steroids, however was not feeling much better.  Labs including cbc, bmp, and bnp are essentially unremarkable, and chest xray does not show an infiltrate or ptx.  He was ambulate and became tachycardic and hypoxic to the point that he was not dischargeable.  I spoke with Dr. Julian Reil from [facility] who wanted more aggressive treatment before he agreed to admit.  He was then ordered iv solumedrol, magnesium, and started on an hour long neb.  He will reassessed once this is complete.  Care signed out to Dr. Rulon Abide at shift change.  Geoffery Lyons, MD 01/06/13 [phone]

## 2013-01-06 NOTE — Progress Notes (Signed)
Pt ambulated in the hall, O2 sat dropped to the higher 80's. Back in his room, at rest his oxygen back to the lower 90's on room air. Will continue to monitor.

## 2013-01-07 DIAGNOSIS — E669 Obesity, unspecified: Secondary | ICD-10-CM

## 2013-01-07 DIAGNOSIS — I517 Cardiomegaly: Secondary | ICD-10-CM

## 2013-01-07 LAB — BASIC METABOLIC PANEL
Calcium: 8.9 mg/dL (ref 8.4–10.5)
Creatinine, Ser: 1.07 mg/dL (ref 0.50–1.35)
GFR calc non Af Amer: 89 mL/min — ABNORMAL LOW (ref >90)
Sodium: 140 mEq/L (ref 135–145)

## 2013-01-07 LAB — GLUCOSE, CAPILLARY: Glucose-Capillary: 104 mg/dL — ABNORMAL HIGH (ref 70–99)

## 2013-01-07 LAB — ALPHA-1-ANTITRYPSIN: A-1 Antitrypsin, Ser: 128 mg/dL (ref 90–200)

## 2013-01-07 MED ORDER — PANTOPRAZOLE SODIUM 40 MG PO TBEC
40.0000 mg | DELAYED_RELEASE_TABLET | Freq: Every day | ORAL | Status: DC
  Administered 2013-01-08: 40 mg via ORAL
  Filled 2013-01-07: qty 1

## 2013-01-07 MED ORDER — BUDESONIDE 0.25 MG/2ML IN SUSP
0.2500 mg | Freq: Two times a day (BID) | RESPIRATORY_TRACT | Status: DC
  Administered 2013-01-08: 0.25 mg via RESPIRATORY_TRACT
  Filled 2013-01-07 (×4): qty 2

## 2013-01-07 MED ORDER — ACETAMINOPHEN 325 MG PO TABS
650.0000 mg | ORAL_TABLET | Freq: Four times a day (QID) | ORAL | Status: DC | PRN
  Administered 2013-01-08: 650 mg via ORAL
  Filled 2013-01-07: qty 2

## 2013-01-07 MED ORDER — PREDNISONE 20 MG PO TABS
40.0000 mg | ORAL_TABLET | Freq: Two times a day (BID) | ORAL | Status: DC
  Administered 2013-01-08: 40 mg via ORAL
  Filled 2013-01-07 (×3): qty 2

## 2013-01-07 NOTE — Progress Notes (Signed)
Echocardiogram 2D Echocardiogram has been performed.  Danny Hale 01/07/2013, 12:07 PM

## 2013-01-07 NOTE — Progress Notes (Signed)
[facility] PROGRESS NOTE  Danny Hale ZOX:[id number] DOB: [birth year] DOA: 01/04/2013 PCP: Default, Provider, MD  Assessment/Plan: Acute severe asthma with status asthmaticus -Patient improving; but still wheezing and hard of breathing with exertion. -start pulmicort -transition to prednisone -continue duo-nebs -Gives history of smoking and have strongly counseled on cessation. -2D echo revealed preserved EF, no wall motion abnormalities and just mild LVH. No crackles or edema on exam; will d/c lasix. -Patient with OHS and OSA contributing with his breathing problem as well. Needs sleep study and weight loss at discharge.  Right Ear infection Started on amoxicillin on admission which will continue No drainage or discharge out of his ear  Tobacco abuse: -counseling provided  Depression:continue cymbalta   OSA: suspected. Needs sleep study at discharge  Obesity: weight loss encourage. Low calorie diet discussed with patient.  Code Status: Full code Family Communication: Discussed with patient Disposition Plan: Home when stable  Consultants:  None  Procedures:  None  Antibiotics:  Amoxicillin started on 3/24  HPI/Subjective: Patient feeling slightly better; but still with significant wheezing and desat on exertion.  Objective: Filed Vitals:   01/06/13 2139 01/06/13 2232 01/07/13 0500 01/07/13 1400  BP: 95/62  104/63 111/57  Pulse: 104  91 112  Temp: 97.7 F (36.5 C)  97.2 F (36.2 C) 97.7 F (36.5 C)  TempSrc:    Oral  Resp: 18  18 18   Height:      Weight:      SpO2: 92% 94% 94% 95%    Intake/Output Summary (Last 24 hours) at 01/07/13 1849 Last data filed at 01/07/13 1700  Gross per 24 hour  Intake    480 ml  Output    900 ml  Net   -420 ml   Filed Weights   01/05/13 0652  Weight: 145.559 kg (320 lb 14.4 oz)    Exam:   General:  Appear in no acute distress; afebrile  HEENT: No pallor, moist oral mucosa  CVS: S1 and S2  tachycardic, no murmurs rub gallop  Respiratory: fair air movement, diffuse wheezing, scattered ronchi.  Abdomen: Soft, nontender, nondistended, positive BS  Extremities: No edema  CNS: AAO x3, no focal defecit   Data Reviewed: Basic Metabolic Panel:  Recent Labs Lab 01/04/13 1835 01/05/13 0308 01/05/13 0946 01/06/13 2011 01/07/13 0604  NA 143 139  --   --  140  K 3.7 3.1*  --   --  4.4  CL 106 102  --   --  106  CO2 28 24  --   --  27  GLUCOSE 112* 217*  --   --  122*  BUN 11 9  --   --  22  CREATININE 1.12 1.03  --   --  1.07  CALCIUM 8.8 8.8  --   --  8.9  MG  --   --  2.4 2.1  --    Liver Function Tests:  Recent Labs Lab 01/04/13 1835  AST 24  ALT 22  ALKPHOS 85  BILITOT 0.2*  PROT 7.1  ALBUMIN 3.5   CBC:  Recent Labs Lab 01/04/13 1835  WBC 7.1  NEUTROABS 3.8  HGB 13.3  HCT 40.0  MCV 87.5  PLT 209   BNP (last 3 results)  Recent Labs  01/04/13 2214  PROBNP 164.1*   CBG:  Recent Labs Lab 01/06/13 0738 01/07/13 0723  GLUCAP 147* 104*    Studies: No results found.  Scheduled Meds: . amoxicillin  500 mg  Oral Q8H  . budesonide (PULMICORT) nebulizer solution  0.25 mg Nebulization BID  . dextromethorphan-guaiFENesin  1 tablet Oral BID  . DULoxetine  60 mg Oral Daily  . nicotine  21 mg Transdermal Daily  . [START ON 01/08/2013] pantoprazole  40 mg Oral Q1200  . potassium chloride  40 mEq Oral BID  . [START ON 01/08/2013] predniSONE  40 mg Oral BID WC  . sodium chloride  3 mL Intravenous Q12H  . traZODone  300 mg Oral QHS   Continuous Infusions:    Time spent: > 30  minutes    Danny Hale  [facility] Pager [phone] If 7PM-7AM, please contact night-coverage at [email or web address], password [facility] 01/07/2013, 6:49 PM  LOS: 3 days

## 2013-01-08 DIAGNOSIS — G4733 Obstructive sleep apnea (adult) (pediatric): Secondary | ICD-10-CM

## 2013-01-08 DIAGNOSIS — F17200 Nicotine dependence, unspecified, uncomplicated: Secondary | ICD-10-CM

## 2013-01-08 LAB — GLUCOSE, CAPILLARY: Glucose-Capillary: 91 mg/dL (ref 70–99)

## 2013-01-08 LAB — BASIC METABOLIC PANEL
BUN: 24 mg/dL — ABNORMAL HIGH (ref 6–23)
CO2: 28 mEq/L (ref 19–32)
Calcium: 8.8 mg/dL (ref 8.4–10.5)
Creatinine, Ser: 1.12 mg/dL (ref 0.50–1.35)
GFR calc Af Amer: >90 mL/min (ref >90)

## 2013-01-08 MED ORDER — PREDNISONE 20 MG PO TABS: ORAL_TABLET | ORAL | Status: DC

## 2013-01-08 MED ORDER — ALBUTEROL SULFATE HFA 108 (90 BASE) MCG/ACT IN AERS: 2.0000 | INHALATION_SPRAY | Freq: Four times a day (QID) | RESPIRATORY_TRACT | Status: DC | PRN

## 2013-01-08 MED ORDER — FLUTICASONE-SALMETEROL 250-50 MCG/DOSE IN AEPB: 1.0000 | INHALATION_SPRAY | Freq: Two times a day (BID) | RESPIRATORY_TRACT | Status: DC

## 2013-01-08 MED ORDER — AMOXICILLIN 500 MG PO CAPS: 500.0000 mg | ORAL_CAPSULE | Freq: Three times a day (TID) | ORAL | Status: AC

## 2013-01-08 MED ORDER — PANTOPRAZOLE SODIUM 40 MG PO TBEC: 40.0000 mg | DELAYED_RELEASE_TABLET | Freq: Every day | ORAL | Status: DC

## 2013-01-08 NOTE — Discharge Summary (Signed)
Physician Discharge Summary  Danny Hale VWU:[id number] DOB: 1976-05-19 DOA: 01/04/2013  PCP: Default, Provider, MD  Admit date: 01/04/2013 Discharge date: 01/08/2013  Time spent: >30 minutes  Recommendations for Outpatient Follow-up:  1. Needs sleep study and to be started on CPAP 2. PFT's 3. Further adjustments on his asthma/COPd medication regimen  Discharge Diagnoses:  Status asthmaticus Asthma exacerbation Obesity OSA OHS GERD Bronchitis R ear otitis media  Discharge Condition: stable and improved. Good oxygen saturation on RA and mild wheezing at discharge. Appointment with pulmonology service Corinda Gubler) arranged at discharge.  Diet recommendation: Heart healthy diet  Filed Weights   01/05/13 0652 01/08/13 0449  Weight: 145.559 kg (320 lb 14.4 oz) 146.965 kg (324 lb)    History of present illness:  37 y.o. male h/o asthma who presents with R ear pain, sore throat, and shortness of breath. Has had cough productive of greenish sputum. No fevers nor chills, severe SOB, asthma inhalor provides only minimal relief.  In the ED CXR demonstrated no infiltrate, he was found to have otitis media and treated with amoxicillin, his asthma exacerbation was not able to be improved despite an hour long neb treatment, steroids, and magnesium. Patient is still desating to 75% on RA with ambulation. Hospitalist admitting for persistent hypoxia.   Hospital Course:  Acute severe asthma with status asthmaticus/Bronchitis -Patient breathing improved and at discharge is not requiring any oxygen supplementation at rest or exertion. -still with mild wheezing, but good air movement.  -started on advair at discharge -Needs pulmonology follow up and PFT's  -Will finish steroids tapering and antibiotics (which even intended for ear infection will also help with bronchitis)  -continue PRN albuterol  -Given history of smoking he received strong ccessation counceling. -2D echo revealed preserved  EF, no wall motion abnormalities and just mild LVH. No crackles or edema on exam; will advice for heart healthy diet. -Patient with body habitus and high risk for OHS and OSA contributing with his breathing problem as well. Needs sleep study, PFT's and weight loss at discharge.   Right Ear infection  Started on amoxicillin on admission which will continue for 5 more days to complete abx therapy. No drainage or discharge out of his ear; no hearing loss.  Tobacco abuse:  -counseling provided -patient is planning to quit   Depression/anxiety:continue cymbalta and trazodone. Will benefit of been on zoloft and probably bupropion instead; as symptoms are mainly anxiety in nature. Will let psychiatrist to make further adjustments and changes to his medications.  OSA: suspected. Needs sleep study at discharge; arranged for 01/29/13. -will follow with [facility] pulmonology for further evaluation and treatment.  Obesity: weight loss encourage. Low calorie diet discussed with patient.  GERD:started on protonix  Procedures: See below for x-ray reports  Consultations:  none  Discharge Exam: Filed Vitals:   01/07/13 1900 01/08/13 0449 01/08/13 0828 01/08/13 1103  BP: 139/84 110/67    Pulse: 94 84    Temp: 98 F (36.7 C) 98 F (36.7 C)    TempSrc: Oral Oral    Resp:      Height:      Weight:  146.965 kg (324 lb)    SpO2: 97% 98% 97% 95%    General: NAD, good oxygen saturation on RA at rest and exertion Cardiovascular: S1 and S2, no rubs or gallops,  Respiratory: mild EXP wheezing, good air movement and scattered rhonchi Abdomen: soft, NT, ND, positive BS Extremities: no edema, no cyanosis Neuro: non focal.  Discharge Instructions  Discharge Orders   Future Appointments Provider Department Dept Phone   02/04/2013 3:45 PM Coralyn Helling, MD [facility] [phone]   Future Orders Complete By Expires     Discharge instructions  As directed     Comments:      Follow a low sodium  diet Follow a low calorie diet and lose weight Stop smoking Arrange follow up and establish relationship with a primary care physician Arrange follow up with psychiatrist for further treatment and medication adjustments        Medication List    STOP taking these medications       ibuprofen 200 MG tablet  Commonly known as:  ADVIL,MOTRIN      TAKE these medications       albuterol 108 (90 BASE) MCG/ACT inhaler  Commonly known as:  PROVENTIL HFA;VENTOLIN HFA  Inhale 2 puffs into the lungs every 6 (six) hours as needed. For shortness of breath     amoxicillin 500 MG capsule  Commonly known as:  AMOXIL  Take 1 capsule (500 mg total) by mouth every 8 (eight) hours.     DULoxetine 60 MG capsule  Commonly known as:  CYMBALTA  Take 60 mg by mouth daily.     Fluticasone-Salmeterol 250-50 MCG/DOSE Aepb  Commonly known as:  ADVAIR DISKUS  Inhale 1 puff into the lungs 2 (two) times daily.     gabapentin 100 MG capsule  Commonly known as:  NEURONTIN  Take 100 mg by mouth 4 (four) times daily as needed (for pain).     OVER THE COUNTER MEDICATION  Take 2 tablets by mouth 2 (two) times daily as needed. "Pain Aid" for pain relief     pantoprazole 40 MG tablet  Commonly known as:  PROTONIX  Take 1 tablet (40 mg total) by mouth daily at 12 noon.     predniSONE 20 MG tablet  Commonly known as:  DELTASONE  Take 2 tablets by mouth daily X 3 days; then 1 tablet by mouth daily X 3 days; then 1/2 tablet by mouth daily X 3 days and stop prednisone     traZODone 150 MG tablet  Commonly known as:  DESYREL  Take 300 mg by mouth at bedtime.           Follow-up Information   Follow up with SOOD,VINEET, MD On 02/04/2013. (appointment is at 3:30 pm)    Contact information:   [address] [place] [address] [phone]       Follow up with [facility] On 01/29/2013.   Contact information:   [address] [place] [address] [phone]       The results of significant diagnostics from this hospitalization (including imaging, microbiology, ancillary and laboratory) are listed below for reference.    Significant Diagnostic Studies: Dg Chest 2 View  01/04/2013  *RADIOLOGY REPORT*  Clinical Data: Cough.  Congestion.  Asthma.  CHEST - 2 VIEW  Comparison: Multiple exams, including 03/26/2012  Findings: Old left rib fractures noted.  Mild cardiomegaly is observed, without edema.  There is linear subsegmental atelectasis or scarring at the left lung base.  No pleural effusion identified.  IMPRESSION:  1.  Linear subsegmental atelectasis or scarring at the left lung base. 2.  Old left rib fractures. 3.  Mild cardiomegaly, without edema.   Original Report Authenticated By: Gaylyn Rong, M.D.    Labs: Basic Metabolic Panel:  Recent Labs Lab 01/04/13 1835 01/05/13 0308 01/05/13  8413 01/06/13 2011 01/07/13 0604 01/08/13 0510  NA 143 139  --   --  140 141  K 3.7 3.1*  --   --  4.4 4.1  CL 106 102  --   --  106 105  CO2 28 24  --   --  27 28  GLUCOSE 112* 217*  --   --  122* 92  BUN 11 9  --   --  22 24*  CREATININE 1.12 1.03  --   --  1.07 1.12  CALCIUM 8.8 8.8  --   --  8.9 8.8  MG  --   --  2.4 2.1  --   --    Liver Function Tests:  Recent Labs Lab 01/04/13 1835  AST 24  ALT 22  ALKPHOS 85  BILITOT 0.2*  PROT 7.1  ALBUMIN 3.5   CBC:  Recent Labs Lab 01/04/13 1835  WBC 7.1  NEUTROABS 3.8  HGB 13.3  HCT 40.0  MCV 87.5  PLT 209   BNP: BNP (last 3 results)  Recent Labs  01/04/13 2214  PROBNP 164.1*   CBG:  Recent Labs Lab 01/06/13 0738 01/07/13 0723 01/08/13 0738  GLUCAP 147* 104* 91    Signed:  Brydan Downard  [facility] 01/08/2013, 2:16 PM

## 2013-01-29 ENCOUNTER — Ambulatory Visit (HOSPITAL_BASED_OUTPATIENT_CLINIC_OR_DEPARTMENT_OTHER): Payer: No Typology Code available for payment source | Attending: Internal Medicine

## 2013-02-04 ENCOUNTER — Institutional Professional Consult (permissible substitution): Payer: Self-pay | Admitting: Pulmonary Disease

## 2013-04-14 ENCOUNTER — Encounter (HOSPITAL_COMMUNITY): Payer: Self-pay | Admitting: *Deleted

## 2013-04-14 ENCOUNTER — Emergency Department (HOSPITAL_COMMUNITY): Payer: Self-pay

## 2013-04-14 DIAGNOSIS — L989 Disorder of the skin and subcutaneous tissue, unspecified: Secondary | ICD-10-CM | POA: Insufficient documentation

## 2013-04-14 DIAGNOSIS — J45901 Unspecified asthma with (acute) exacerbation: Secondary | ICD-10-CM | POA: Insufficient documentation

## 2013-04-14 DIAGNOSIS — Z79899 Other long term (current) drug therapy: Secondary | ICD-10-CM | POA: Insufficient documentation

## 2013-04-14 DIAGNOSIS — R079 Chest pain, unspecified: Secondary | ICD-10-CM | POA: Insufficient documentation

## 2013-04-14 DIAGNOSIS — M79609 Pain in unspecified limb: Secondary | ICD-10-CM | POA: Insufficient documentation

## 2013-04-14 DIAGNOSIS — M545 Low back pain, unspecified: Secondary | ICD-10-CM | POA: Insufficient documentation

## 2013-04-14 DIAGNOSIS — F17200 Nicotine dependence, unspecified, uncomplicated: Secondary | ICD-10-CM | POA: Insufficient documentation

## 2013-04-14 DIAGNOSIS — M722 Plantar fascial fibromatosis: Secondary | ICD-10-CM | POA: Insufficient documentation

## 2013-04-14 DIAGNOSIS — IMO0002 Reserved for concepts with insufficient information to code with codable children: Secondary | ICD-10-CM | POA: Insufficient documentation

## 2013-04-14 DIAGNOSIS — R1110 Vomiting, unspecified: Secondary | ICD-10-CM | POA: Insufficient documentation

## 2013-04-14 MED ORDER — ONDANSETRON HCL 4 MG PO TABS: 8.0000 mg | ORAL_TABLET | Freq: Once | ORAL | Status: DC

## 2013-04-14 NOTE — ED Notes (Addendum)
X 2 weeks of lower leg swelling; c/o lower back pain; lt. Foot pain x 3 weeks; raised reddened areas on extremities; started throwing up today.  Ran out of albuterol b/c of funds. Lg. Sds. Clear and diminished in all lung fields. Multiple complaints.

## 2013-04-15 ENCOUNTER — Emergency Department (HOSPITAL_COMMUNITY)
Admission: EM | Admit: 2013-04-15 | Discharge: 2013-04-15 | Disposition: A | Discharge status: Home / Self Care | Payer: Self-pay | Attending: Emergency Medicine | Admitting: Emergency Medicine

## 2013-04-15 DIAGNOSIS — L989 Disorder of the skin and subcutaneous tissue, unspecified: Secondary | ICD-10-CM

## 2013-04-15 DIAGNOSIS — M722 Plantar fascial fibromatosis: Secondary | ICD-10-CM

## 2013-04-15 DIAGNOSIS — J4521 Mild intermittent asthma with (acute) exacerbation: Secondary | ICD-10-CM

## 2013-04-15 DIAGNOSIS — M545 Low back pain: Secondary | ICD-10-CM

## 2013-04-15 LAB — BASIC METABOLIC PANEL
BUN: 14 mg/dL (ref 6–23)
Calcium: 8.8 mg/dL (ref 8.4–10.5)
Creatinine, Ser: 1.09 mg/dL (ref 0.50–1.35)
GFR calc Af Amer: >90 mL/min (ref >90)
GFR calc non Af Amer: 87 mL/min — ABNORMAL LOW (ref >90)
Potassium: 3.8 mEq/L (ref 3.5–5.1)

## 2013-04-15 LAB — CBC
HCT: 38.2 % — ABNORMAL LOW (ref 39.0–52.0)
MCH: 28.7 pg (ref 26.0–34.0)
MCHC: 33 g/dL (ref 30.0–36.0)
RDW: 12.9 % (ref 11.5–15.5)

## 2013-04-15 MED ORDER — METHYLPREDNISOLONE SODIUM SUCC 125 MG IJ SOLR
125.0000 mg | Freq: Once | INTRAMUSCULAR | Status: DC
  Filled 2013-04-15: qty 2

## 2013-04-15 MED ORDER — METHYLPREDNISOLONE SODIUM SUCC 125 MG IJ SOLR
125.0000 mg | Freq: Once | INTRAMUSCULAR | Status: AC
  Administered 2013-04-15: 125 mg via INTRAMUSCULAR

## 2013-04-15 MED ORDER — HYDROCODONE-ACETAMINOPHEN 5-325 MG PO TABS
1.0000 | ORAL_TABLET | Freq: Once | ORAL | Status: AC
  Administered 2013-04-15: 1 via ORAL
  Filled 2013-04-15: qty 1

## 2013-04-15 MED ORDER — ALBUTEROL SULFATE (5 MG/ML) 0.5% IN NEBU
5.0000 mg | INHALATION_SOLUTION | Freq: Once | RESPIRATORY_TRACT | Status: AC
  Administered 2013-04-15: 5 mg via RESPIRATORY_TRACT
  Filled 2013-04-15: qty 1

## 2013-04-15 MED ORDER — ALBUTEROL SULFATE HFA 108 (90 BASE) MCG/ACT IN AERS: 1.0000 | INHALATION_SPRAY | Freq: Four times a day (QID) | RESPIRATORY_TRACT | Status: DC | PRN

## 2013-04-15 MED ORDER — HYDROCODONE-ACETAMINOPHEN 7.5-325 MG/15ML PO SOLN: 10.0000 mL | Freq: Three times a day (TID) | ORAL | Status: DC | PRN

## 2013-04-15 MED ORDER — PREDNISONE 20 MG PO TABS: 60.0000 mg | ORAL_TABLET | Freq: Every day | ORAL | Status: DC

## 2013-04-15 MED ORDER — ALBUTEROL SULFATE HFA 108 (90 BASE) MCG/ACT IN AERS
2.0000 | INHALATION_SPRAY | RESPIRATORY_TRACT | Status: DC | PRN
  Administered 2013-04-15: 2 via RESPIRATORY_TRACT
  Filled 2013-04-15: qty 6.7

## 2013-04-15 MED ORDER — HYDROMORPHONE HCL PF 1 MG/ML IJ SOLN
1.0000 mg | Freq: Once | INTRAMUSCULAR | Status: AC
  Administered 2013-04-15: 1 mg via INTRAMUSCULAR
  Filled 2013-04-15: qty 1

## 2013-04-15 NOTE — ED Notes (Signed)
Pt here with multiple complaints.  Sts he is short of breath and has a hx of COPD.  Sts he uses an inhaler at home but has not used it recently due to not being able to afford medication.  Pt also reporting swelling to his legs bilaterally.  Sts this has been going on for about a month.  Pt also reporting multiple raised spots to legs that he sts he has noticed for a "couple of months".  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  Pt also reporting left foot pain that has been present for some time as well.  Also reporting lower back pain that radiates down right leg "it feels like something is shooting down my leg".

## 2013-04-15 NOTE — ED Provider Notes (Signed)
History    CSN: [id number] Arrival date & time 04/14/13  2301  First MD Initiated Contact with Patient 04/15/13 0125     Chief Complaint  Patient presents with  . Shortness of Breath  . Emesis  . Back Pain  . Foot Pain  . Asthma   (Consider location/radiation/quality/duration/timing/severity/associated sxs/prior Treatment) HPI History provided by patient. He comes in tonight with multiple complaints. His chief complaint seems to be shortness of breath with associated chest tightness and a history of asthma. Patient states he cannot afford an inhaler. He does not have a primary care physician. He also complains of some low back pain that has been present for the last few days, worse with movement and ambulation. He is requesting pain medications for this. He denies any fall, trauma, incontinence, weakness or numbness. He currently complains of left foot pain, over the arch, worse with ambulation. Finally he has multiple skin lesions to his lower extremities. He denies any tick bites. He does scratch at them. He feels like there are some areas of swelling. He denies any fevers or chills. No nausea vomiting or diarrhea.  He agrees that he needs a primary care physician. He continues to smoke tob daily.  Past Medical History  Diagnosis Date  . Asthma    History reviewed. No pertinent past surgical history. Family History  Problem Relation Age of Onset  . COPD Neg Hx    History  Substance Use Topics  . Smoking status: Current Every Day Smoker  . Smokeless tobacco: Never Used  . Alcohol Use: Yes    Review of Systems  Constitutional: Negative for fever and chills.  HENT: Negative for neck pain and neck stiffness.   Eyes: Negative for visual disturbance.  Respiratory: Positive for shortness of breath.   Cardiovascular: Positive for chest pain.  Gastrointestinal: Negative for abdominal pain.  Genitourinary: Negative for flank pain.  Musculoskeletal: Negative for back pain.   Skin: Positive for wound. Negative for rash.  Neurological: Negative for headaches.  All other systems reviewed and are negative.    Allergies  Review of patient's allergies indicates no known allergies.  Home Medications   Current Outpatient Rx  Name  Route  Sig  Dispense  Refill  . acetaminophen (TYLENOL) 325 MG tablet   Oral   Take 650 mg by mouth every 6 (six) hours as needed for pain. For pain         . albuterol (PROVENTIL HFA;VENTOLIN HFA) 108 (90 BASE) MCG/ACT inhaler   Inhalation   Inhale 2 puffs into the lungs every 6 (six) hours as needed. For shortness of breath   1 Inhaler   3   . Fluticasone-Salmeterol (ADVAIR DISKUS) 250-50 MCG/DOSE AEPB   Inhalation   Inhale 1 puff into the lungs 2 (two) times daily.   60 each   1    BP 126/58  Pulse 93  Temp(Src) 98.4 F (36.9 C) (Oral)  Resp 13  SpO2 95% Physical Exam  Constitutional: He is oriented to person, place, and time. He appears well-developed and well-nourished.  HENT:  Head: Normocephalic and atraumatic.  Eyes: EOM are normal. Pupils are equal, round, and reactive to light.  Neck: Neck supple.  Cardiovascular: Normal rate, regular rhythm and intact distal pulses.   Pulmonary/Chest: Effort normal. No respiratory distress.  Mild bilateral expiratory wheezes with good air movement otherwise  Abdominal: Soft. Bowel sounds are normal. He exhibits no distension. There is no tenderness.  Musculoskeletal: Normal range of motion. He  exhibits no edema.  Mild tenderness over the lower paralumbar spine without midline tenderness or deformity. Tender over the plantar aspect of the left foot without associated swelling, erythema or deformity consistent with plantar fasciitis. Multiple skin lesions to both lower extremities without any evidence of cellulitis or increased warmth to touch. No streaking lymphangitis or associated lymphadenopathy.  Neurological: He is alert and oriented to person, place, and time.  Skin:  Skin is warm and dry.    ED Course  Procedures (including critical care time) Labs Reviewed  CBC - Abnormal; Notable for the following:    Hemoglobin 12.6 (*)    HCT 38.2 (*)    All other components within normal limits  BASIC METABOLIC PANEL - Abnormal; Notable for the following:    GFR calc non Af Amer 87 (*)    All other components within normal limits  PRO B NATRIURETIC PEPTIDE - Abnormal; Notable for the following:    Pro B Natriuretic peptide (BNP) 140.6 (*)    All other components within normal limits  POCT I-STAT TROPONIN I   Dg Chest 2 View  04/14/2013   *RADIOLOGY REPORT*  Clinical Data: Vomiting and shortness of breath.  CHEST - 2 VIEW  Comparison: PA and lateral chest 01/04/2013.  Findings: Lungs are clear.  Heart size is upper normal.  No pneumothorax or pleural fluid.  Remote left rib fractures noted.  IMPRESSION: No acute finding.   Original Report Authenticated By: Holley Dexter, M.D.   Albuterol and steroids provided. IM dilaudid pain control  MDM  Asthma exacerbation and a smoker, improved with steroids and inhaler. Inhaler provided  Plantar fasciitis. Will prescribe steroids for Asthma exacerbation and plantar fasciitis  Multiple skin lesions consistent with bug bites. Antibiotics prescribed  Outpatient referrals provided - patient agrees to outpatient followup and strict return precautions for any worsening condition.  Labs and imaging obtained and reviewed as above.  Vital signs nurse's notes reviewed and considered  Sunnie Nielsen, MD 04/15/13 [phone]

## 2013-09-02 ENCOUNTER — Emergency Department (HOSPITAL_COMMUNITY)
Admission: EM | Admit: 2013-09-02 | Discharge: 2013-09-02 | Disposition: A | Discharge status: Home / Self Care | Payer: Self-pay | Attending: Emergency Medicine | Admitting: Emergency Medicine

## 2013-09-02 ENCOUNTER — Other Ambulatory Visit: Payer: Self-pay

## 2013-09-02 ENCOUNTER — Encounter (HOSPITAL_COMMUNITY): Payer: Self-pay | Admitting: Emergency Medicine

## 2013-09-02 ENCOUNTER — Emergency Department (HOSPITAL_COMMUNITY): Payer: Self-pay

## 2013-09-02 DIAGNOSIS — J45901 Unspecified asthma with (acute) exacerbation: Secondary | ICD-10-CM

## 2013-09-02 DIAGNOSIS — E669 Obesity, unspecified: Secondary | ICD-10-CM | POA: Insufficient documentation

## 2013-09-02 DIAGNOSIS — IMO0002 Reserved for concepts with insufficient information to code with codable children: Secondary | ICD-10-CM | POA: Insufficient documentation

## 2013-09-02 DIAGNOSIS — J441 Chronic obstructive pulmonary disease with (acute) exacerbation: Secondary | ICD-10-CM | POA: Insufficient documentation

## 2013-09-02 DIAGNOSIS — F17200 Nicotine dependence, unspecified, uncomplicated: Secondary | ICD-10-CM | POA: Insufficient documentation

## 2013-09-02 DIAGNOSIS — Z79899 Other long term (current) drug therapy: Secondary | ICD-10-CM | POA: Insufficient documentation

## 2013-09-02 HISTORY — DX: Chronic obstructive pulmonary disease, unspecified: J44.9

## 2013-09-02 HISTORY — DX: Obesity, unspecified: E66.9

## 2013-09-02 HISTORY — DX: Nicotine dependence, cigarettes, uncomplicated: F17.210

## 2013-09-02 HISTORY — DX: Emphysema, unspecified: J43.9

## 2013-09-02 LAB — COMPREHENSIVE METABOLIC PANEL
AST: 18 U/L (ref 0–37)
Albumin: 3.9 g/dL (ref 3.5–5.2)
BUN: 15 mg/dL (ref 6–23)
Calcium: 9 mg/dL (ref 8.4–10.5)
Creatinine, Ser: 1.1 mg/dL (ref 0.50–1.35)
GFR calc non Af Amer: 86 mL/min — ABNORMAL LOW (ref >90)

## 2013-09-02 LAB — CBC WITH DIFFERENTIAL/PLATELET
Basophils Absolute: 0 10*3/uL (ref 0.0–0.1)
Basophils Relative: 1 % (ref 0–1)
Eosinophils Relative: 6 % — ABNORMAL HIGH (ref 0–5)
HCT: 39.6 % (ref 39.0–52.0)
MCH: 29.3 pg (ref 26.0–34.0)
MCHC: 33.8 g/dL (ref 30.0–36.0)
MCV: 86.7 fL (ref 78.0–100.0)
Monocytes Absolute: 0.6 10*3/uL (ref 0.1–1.0)
Monocytes Relative: 10 % (ref 3–12)
RDW: 13.8 % (ref 11.5–15.5)

## 2013-09-02 LAB — TROPONIN I: Troponin I: <0.3 ng/mL (ref <0.30)

## 2013-09-02 LAB — PRO B NATRIURETIC PEPTIDE: Pro B Natriuretic peptide (BNP): 25.3 pg/mL (ref 0–125)

## 2013-09-02 MED ORDER — IPRATROPIUM BROMIDE 0.02 % IN SOLN
0.5000 mg | Freq: Once | RESPIRATORY_TRACT | Status: AC
  Administered 2013-09-02: 0.5 mg via RESPIRATORY_TRACT
  Filled 2013-09-02: qty 2.5

## 2013-09-02 MED ORDER — ALBUTEROL SULFATE HFA 108 (90 BASE) MCG/ACT IN AERS
6.0000 | INHALATION_SPRAY | Freq: Once | RESPIRATORY_TRACT | Status: AC
  Administered 2013-09-02: 6 via RESPIRATORY_TRACT
  Filled 2013-09-02: qty 6.7

## 2013-09-02 MED ORDER — ALBUTEROL SULFATE (5 MG/ML) 0.5% IN NEBU
5.0000 mg | INHALATION_SOLUTION | Freq: Once | RESPIRATORY_TRACT | Status: AC
  Administered 2013-09-02: 5 mg via RESPIRATORY_TRACT
  Filled 2013-09-02: qty 1

## 2013-09-02 MED ORDER — PREDNISONE 10 MG PO TABS: 50.0000 mg | ORAL_TABLET | Freq: Every day | ORAL | Status: DC

## 2013-09-02 MED ORDER — ALBUTEROL SULFATE HFA 108 (90 BASE) MCG/ACT IN AERS
2.0000 | INHALATION_SPRAY | Freq: Once | RESPIRATORY_TRACT | Status: AC
  Administered 2013-09-02: 2 via RESPIRATORY_TRACT
  Filled 2013-09-02: qty 6.7

## 2013-09-02 MED ORDER — ALBUTEROL SULFATE HFA 108 (90 BASE) MCG/ACT IN AERS: 1.0000 | INHALATION_SPRAY | Freq: Four times a day (QID) | RESPIRATORY_TRACT | Status: DC | PRN

## 2013-09-02 MED ORDER — PREDNISONE 20 MG PO TABS
60.0000 mg | ORAL_TABLET | Freq: Once | ORAL | Status: AC
  Administered 2013-09-02: 60 mg via ORAL
  Filled 2013-09-02: qty 3

## 2013-09-02 NOTE — ED Notes (Signed)
Pt. reports asthma attack onset 4 days ago with occasional dry cough and chest tightness , no MDI at home .

## 2013-09-02 NOTE — ED Provider Notes (Signed)
CSN: [id number]     Arrival date & time 09/02/13  2018 History   First MD Initiated Contact with Patient 09/02/13 2047     Chief Complaint  Patient presents with  . Asthma   (Consider location/radiation/quality/duration/timing/severity/associated sxs/prior Treatment) HPI Here for SOB, wheezing c/w prior asthma exacerbation. Onset was gradual within past few days, worsening.  The severity is moderate. Modifying factors: worse with coughing.  Associated symptoms: cough, no fever.  Recent medical care: none.   Past Medical History  Diagnosis Date  . Asthma   . Obesity   . COPD (chronic obstructive pulmonary disease)   . Emphysema   . Cigarette smoker    Past Surgical History  Procedure Laterality Date  . Ankle surgery     Family History  Problem Relation Age of Onset  . COPD Neg Hx    History  Substance Use Topics  . Smoking status: Current Every Day Smoker  . Smokeless tobacco: Never Used  . Alcohol Use: Yes    Review of Systems Constitutional: Negative for fever.  Eyes: Negative for vision loss.  ENT: Negative for difficulty swallowing.  Cardiovascular: Negative for chest pain. Respiratory: Negative for respiratory distress.  Gastrointestinal:  Negative for vomiting.  Genitourinary: Negative for inability to void.  Musculoskeletal: Negative for gait problem.  Integumentary: Negative for rash.  Neurological: Negative for new focal weakness.     Allergies  Review of patient's allergies indicates no known allergies.  Home Medications   Current Outpatient Rx  Name  Route  Sig  Dispense  Refill  . acetaminophen (TYLENOL) 325 MG tablet   Oral   Take 650 mg by mouth every 6 (six) hours as needed for pain. For pain         . albuterol (PROVENTIL HFA;VENTOLIN HFA) 108 (90 BASE) MCG/ACT inhaler   Inhalation   Inhale 2 puffs into the lungs every 6 (six) hours as needed. For shortness of breath   1 Inhaler   3   . albuterol (PROVENTIL HFA;VENTOLIN HFA) 108 (90  BASE) MCG/ACT inhaler   Inhalation   Inhale 1-2 puffs into the lungs every 6 (six) hours as needed for wheezing or shortness of breath.   1 Inhaler   0   . predniSONE (DELTASONE) 10 MG tablet   Oral   Take 5 tablets (50 mg total) by mouth daily.   20 tablet   0    BP 142/69  Pulse 86  Temp(Src) 98.4 F (36.9 C) (Oral)  Resp 14  Wt 338 lb 11.2 oz (153.633 kg)  SpO2 96% Physical Exam Nursing note and vitals reviewed.  Constitutional: Pt is alert and appears stated age. Eyes: No injection, no scleral icterus. HENT: Atraumatic, airway open without erythema or exudate.  Respiratory: No respiratory distress. Equal breathing bilaterally. Bilateral expiratory wheeze.   Cardiovascular: Normal rate. Extremities warm and well perfused.  Abdomen: Soft, non-tender. MSK: Extremities are atraumatic without deformity. Skin: No rash, no wounds.   Neuro: No motor nor sensory deficit.     ED Course  Procedures (including critical care time) Labs Review Labs Reviewed  CBC WITH DIFFERENTIAL - Abnormal; Notable for the following:    Eosinophils Relative 6 (*)    All other components within normal limits  COMPREHENSIVE METABOLIC PANEL - Abnormal; Notable for the following:    Glucose, Bld 105 (*)    Total Bilirubin 0.2 (*)    GFR calc non Af Amer 86 (*)    All other components within normal limits  PRO B NATRIURETIC PEPTIDE  TROPONIN I   Imaging Review Dg Chest 2 View  09/02/2013   CLINICAL DATA:  Asthma, COPD, history of smoking  EXAM: CHEST  2 VIEW  COMPARISON:  04/14/2013; 08/20/2010; chest CT - 03/26/2012  FINDINGS: Grossly unchanged borderline enlarged cardiac silhouette and mediastinal contours. The lungs appear mildly hyperexpanded with mild diffuse slightly nodular thickening of the pulmonary interstitium. Minimal left basilar linear heterogeneous opacities favored to represent atelectasis or scar. No focal airspace opacities. No definite pleural effusion or pneumothorax.  Grossly unchanged bones including multiple old / healed left-sided posterior lateral rib fractures.  IMPRESSION: 1. Mild lung hyperexpansion and bronchitic change without acute cardiopulmonary disease. 2. Grossly unchanged left basilar atelectasis / scar.   Electronically Signed   By: Simonne Come M.D.   On: 09/02/2013 21:29    EKG Interpretation    Date/Time:  Wednesday September 02 2013 14:78:29 EST Ventricular Rate:  110 PR Interval:  160 QRS Duration: 88 QT Interval:  320 QTC Calculation: 433 R Axis:   72 Text Interpretation:  Sinus tachycardia Otherwise normal ECG ED PHYSICIAN INTERPRETATION AVAILABLE IN [facility] Confirmed by TEST, RECORD ([id number]) on 09/04/2013 9:13:43 AM            MDM   1. Asthma exacerbation    37 y.o. male w/ PMHx of asthma, current tobacco use presents w/ asthma exacerbation. No oxygen requirement. EKG with sinus tach, no signs of ischemia. CXR without PNA. Labs as above normal. Pt given nebulized treatments followed by albuterol inhaler, PO prednisone. Plan for d/c with usual care, pcp follow up, return if worse. Prior to d/c pt ambulated in hall while maintaining good saturations. Smoking cessation counseling provided.       I independently viewed, interpreted, and used in my medical decision making all ordered lab and imaging tests. Medical Decision Making discussed with ED attending Dr. Manus Gunning.     Charm Barges, MD 09/04/13 1925

## 2013-09-05 NOTE — ED Provider Notes (Signed)
I saw and evaluated the patient, reviewed the resident's note and I agree with the findings and plan. If applicable, I agree with the resident's interpretation of the EKG.  If applicable, I was present for critical portions of any procedures performed.  SOB, wheezing, cough x 1 week, out of albuterol at home. No chest pain. No distress. Fair air exchange, bilateral expiratory wheezing. Speaking in full sentences.  Nebs, steroids, reassess.   Glynn Octave, MD 09/05/13 [phone]

## 2014-02-15 ENCOUNTER — Encounter (HOSPITAL_COMMUNITY): Payer: Self-pay | Admitting: Emergency Medicine

## 2014-02-15 ENCOUNTER — Emergency Department (HOSPITAL_COMMUNITY): Payer: Self-pay

## 2014-02-15 ENCOUNTER — Emergency Department (HOSPITAL_COMMUNITY)
Admission: EM | Admit: 2014-02-15 | Discharge: 2014-02-15 | Disposition: A | Discharge status: Home / Self Care | Payer: Self-pay | Attending: Emergency Medicine | Admitting: Emergency Medicine

## 2014-02-15 DIAGNOSIS — E669 Obesity, unspecified: Secondary | ICD-10-CM | POA: Insufficient documentation

## 2014-02-15 DIAGNOSIS — Y9389 Activity, other specified: Secondary | ICD-10-CM | POA: Insufficient documentation

## 2014-02-15 DIAGNOSIS — Y9289 Other specified places as the place of occurrence of the external cause: Secondary | ICD-10-CM | POA: Insufficient documentation

## 2014-02-15 DIAGNOSIS — R296 Repeated falls: Secondary | ICD-10-CM | POA: Insufficient documentation

## 2014-02-15 DIAGNOSIS — S93609A Unspecified sprain of unspecified foot, initial encounter: Secondary | ICD-10-CM

## 2014-02-15 DIAGNOSIS — J4489 Other specified chronic obstructive pulmonary disease: Secondary | ICD-10-CM | POA: Insufficient documentation

## 2014-02-15 DIAGNOSIS — F17200 Nicotine dependence, unspecified, uncomplicated: Secondary | ICD-10-CM | POA: Insufficient documentation

## 2014-02-15 DIAGNOSIS — X500XXA Overexertion from strenuous movement or load, initial encounter: Secondary | ICD-10-CM | POA: Insufficient documentation

## 2014-02-15 DIAGNOSIS — J449 Chronic obstructive pulmonary disease, unspecified: Secondary | ICD-10-CM | POA: Insufficient documentation

## 2014-02-15 DIAGNOSIS — M722 Plantar fascial fibromatosis: Secondary | ICD-10-CM

## 2014-02-15 MED ORDER — OXYCODONE-ACETAMINOPHEN 5-325 MG PO TABS
1.0000 | ORAL_TABLET | Freq: Once | ORAL | Status: AC
  Administered 2014-02-15: 1 via ORAL
  Filled 2014-02-15: qty 1

## 2014-02-15 MED ORDER — IBUPROFEN 600 MG PO TABS: 600.0000 mg | ORAL_TABLET | Freq: Four times a day (QID) | ORAL | Status: DC | PRN

## 2014-02-15 MED ORDER — HYDROCODONE-ACETAMINOPHEN 5-325 MG PO TABS: 1.0000 | ORAL_TABLET | Freq: Four times a day (QID) | ORAL | Status: DC | PRN

## 2014-02-15 NOTE — ED Notes (Signed)
Pt is here with left foot pain near pinkie toe.  No wounds.  Pt states happened after he fell about 5 months ago

## 2014-02-15 NOTE — Discharge Instructions (Signed)
Please call and set-up an appointment with Orthopedics to be seen and assessed regarding ongoing left foot pain Please rest and stay hydrated Please wear postop shoe Please after each work day elevate the leg and rest and apply ice Please wear postop shoe when walking around the house, not working Please acquire proper insoles Please take medications as prescribed this will aid in reduction of the inflammation process Please continue monitor symptoms closely and if symptoms are to worsen or change (fever greater than 101, chills, chest pain, shortness of breath, difficulty breathing, numbness, tingling, fall, injury, loss of sensation to the foot, inability to walk, inability to prior pressure, worsening or changes to pain) please report back to the ED immediately   Foot Sprain The muscles and cord like structures which attach muscle to bone (tendons) that surround the feet are made up of units. A foot sprain can occur at the weakest spot in any of these units. This condition is most often caused by injury to or overuse of the foot, as from playing contact sports, or aggravating a previous injury, or from poor conditioning, or obesity. SYMPTOMS  Pain with movement of the foot.  Tenderness and swelling at the injury site.  Loss of strength is present in moderate or severe sprains. THE THREE GRADES OR SEVERITY OF FOOT SPRAIN ARE:  Mild (Grade I): Slightly pulled muscle without tearing of muscle or tendon fibers or loss of strength.  Moderate (Grade II): Tearing of fibers in a muscle, tendon, or at the attachment to bone, with small decrease in strength.  Severe (Grade III): Rupture of the muscle-tendon-bone attachment, with separation of fibers. Severe sprain requires surgical repair. Often repeating (chronic) sprains are caused by overuse. Sudden (acute) sprains are caused by direct injury or over-use. DIAGNOSIS  Diagnosis of this condition is usually by your own observation. If problems  continue, a caregiver may be required for further evaluation and treatment. X-rays may be required to make sure there are not breaks in the bones (fractures) present. Continued problems may require physical therapy for treatment. PREVENTION  Use strength and conditioning exercises appropriate for your sport.  Warm up properly prior to working out.  Use athletic shoes that are made for the sport you are participating in.  Allow adequate time for healing. Early return to activities makes repeat injury more likely, and can lead to an unstable arthritic foot that can result in prolonged disability. Mild sprains generally heal in 3 to 10 days, with moderate and severe sprains taking 2 to 10 weeks. Your caregiver can help you determine the proper time required for healing. HOME CARE INSTRUCTIONS   Apply ice to the injury for 15-20 minutes, 03-04 times per day. Put the ice in a plastic bag and place a towel between the bag of ice and your skin.  An elastic wrap (like an Ace bandage) may be used to keep swelling down.  Keep foot above the level of the heart, or at least raised on a footstool, when swelling and pain are present.  Try to avoid use other than gentle range of motion while the foot is painful. Do not resume use until instructed by your caregiver. Then begin use gradually, not increasing use to the point of pain. If pain does develop, decrease use and continue the above measures, gradually increasing activities that do not cause discomfort, until you gradually achieve normal use.  Use crutches if and as instructed, and for the length of time instructed.  Keep injured foot  and ankle wrapped between treatments.  Massage foot and ankle for comfort and to keep swelling down. Massage from the toes up towards the knee.  Only take over-the-counter or prescription medicines for pain, discomfort, or fever as directed by your caregiver. SEEK IMMEDIATE MEDICAL CARE IF:   Your pain and swelling  increase, or pain is not controlled with medications.  You have loss of feeling in your foot or your foot turns cold or blue.  You develop new, unexplained symptoms, or an increase of the symptoms that brought you to your caregiver. MAKE SURE YOU:   Understand these instructions.  Will watch your condition.  Will get help right away if you are not doing well or get worse. Document Released: 03/23/2002 Document Revised: 12/24/2011 Document Reviewed: 05/20/2008 [facility] Patient Information 2014 [place], [place].   Emergency Department Resource Guide 1) Find a Doctor and Pay Out of Pocket Although you won't have to find out who is covered by your insurance plan, it is a good idea to ask around and get recommendations. You will then need to call the office and see if the doctor you have chosen will accept you as a new patient and what types of options they offer for patients who are self-pay. Some doctors offer discounts or will set up payment plans for their patients who do not have insurance, but you will need to ask so you aren't surprised when you get to your appointment.  2) Contact Your Local Health Department Not all health departments have doctors that can see patients for sick visits, but many do, so it is worth a call to see if yours does. If you don't know where your local health department is, you can check in your phone book. The CDC also has a tool to help you locate your state's health department, and many state websites also have listings of all of their local health departments.  3) Find a [facility] If your illness is not likely to be very severe or complicated, you may want to try a walk in clinic. These are popping up all over the country in pharmacies, drugstores, and shopping centers. They're usually staffed by nurse practitioners or physician assistants that have been trained to treat common illnesses and complaints. They're usually fairly quick and inexpensive. However,  if you have serious medical issues or chronic medical problems, these are probably not your best option.  No Primary Care Doctor: - Call Health Connect at  [phone] - they can help you locate a primary care doctor that  accepts your insurance, provides certain services, etc. - Physician Referral Service- [phone]  Chronic Pain Problems: Organization         Address  Phone   Notes  [facility]  [phone] Patients need to be referred by their primary care doctor.   Medication Assistance: Organization         Address  Phone   Notes  [facility] Medication [facility] [address] [place], [place] [address] [phone] --Must be a resident of [facility] -- Must have NO insurance coverage whatsoever (no Medicaid/ Medicare, etc.) -- The pt. MUST have a primary care doctor that directs their care regularly and follows them in the community   [facility]ist  [phone]   [employer]  [phone]    Agencies that provide inexpensive medical care: Organization         Address  Phone   Notes  [facility]  Family Medicine  [phone]   [facility] Internal [facility]    [phone]   [facility] [place], [place] [address] [phone]   [place] [address] [place] [phone]   Planned Parenthood    [phone]   [facility]    [phone]   [place] and [place] [address], [place] Phone:  [phone], Fax:  [phone] Hours of Operation:  9 am - 6 pm, M-F.  Also accepts Medicaid/Medicare and self-pay.  [facility] for [place], [address], [place] Phone: [phone], Fax: [phone]. Hours of Operation:  8:30 am - 5:30 pm, M-F.  Also accepts Medicaid and self-pay.  [facility] [place] [address] [place] Phone: [phone]   [place], [place], [place] [phone], Ext. [phone] Mondays & Thursdays: 7-9 AM.  First 15 patients are seen on a first come, first serve basis.    Carlos Providers:  Organization         Address  Phone   Notes  [facility] [address], [place] [phone] Also accepts self-pay patients.  [facility] [address], [place]  [phone]   [place], [address], [place] [phone]   [facility] Family Medicine [address] [place] [phone]   Lucianne Lei [address], [place]   [phone] Only accepts [place] Access [place] patients after they have their name applied to their card.   Self-Pay (no insurance) in [facility]:  Organization         Address  Phone   Notes  Sickle Cell Patients, [facility] Internal Medicine [place] [phone]   [facility] Urgent Care [place] [phone]   [facility] [place]  [place], [place], [place] [phone]   [facility]/Dr. Osei-Bonsu  [address] [place] or [address], [place] [phone] Phone number for both [place] and [place] locations is the same.  Urgent Medical and [facility] [address] [place] [phone]   [facility] [address], [place] or [address] [phone]   [facility] [address], [place] [phone], phone; [phone], fax Sees patients 1st and 3rd Saturday of every month.  Must not qualify for public or private insurance (i.e. Medicaid, Medicare, [place] Health Choice, Veterans' Benefits)  Household income should be no more than 200% of the poverty level The clinic cannot treat you if you are pregnant or think you are  pregnant  Sexually transmitted diseases are not treated at the clinic.    Dental Care: Organization         Address  Phone  Notes  [facility] Department of [facility] [place] [phone] Accepts children up to age 100 who are enrolled in [place] or [place]; pregnant women with a Medicaid card; and children who have applied for Medicaid or [place] Health Choice, but were declined, whose parents can pay a reduced fee at time of service.  [place]  [place]  [address], [place] [phone] Accepts children up to age 1 who are enrolled in Medicaid or [place]; pregnant women with a Medicaid card; and children who have applied for Medicaid or [place] Health Choice, but were declined, whose parents can pay a reduced fee at time of service.  [facility] Adult [facility] Access PROGRAM  [place] [phone] Patients are seen by appointment only. Walk-ins are not accepted. [place] will see patients 23 years of age and older. Monday - Tuesday (8am-5pm) Most Wednesdays (8:30-5pm) $30 per visit, cash only  [facility] Adult Dental Access PROGRAM  [address], [facility] [phone] Patients are seen by appointment only. Walk-ins are not accepted. [place] will see patients 57 years of age and older. One Wednesday Evening (Monthly: Volunteer Based).  $30 per visit, cash only  [place]  [phone] for adults; Children under age 57, call Graduate Pediatric Dentistry at [phone]. Children aged 30-14, please call [phone] to request a pediatric application.  Dental services are provided in all areas of dental care including fillings, crowns and bridges, complete and partial dentures, implants, gum treatment, root canals, and extractions. Preventive care is also provided. Treatment is provided to both adults and  children. Patients are selected via a lottery and there is often a waiting list.   [facility] [address] [place]  [phone] [email or web address]   [facility] [address] [place], [place] [phone], Ext. [phone] Second and Fourth Thursday of each month, opens at 6:30 AM; Clinic ends at 9 AM.  Patients are seen on a first-come first-served basis, and a limited number are seen during each clinic.   [facility]  [address] Hillard Danker [place], [place] [phone]   Eligibility Requirements You must have lived in [place], [place], or [place] counties for at least the last three months.   You cannot be eligible for state or federal sponsored [employer], including [employer], [place], or [employer].   You generally cannot be eligible for healthcare insurance through your employer.    How to apply: Eligibility screenings are held every Tuesday and Wednesday afternoon from 1:00 pm until 4:00 pm. You do not need an appointment for the interview!  [facility] [address], [place], [place]   [place]  [place] Department  [place]  [phone]    Behavioral Health Resources in the Community: Intensive Outpatient Programs Organization         Address  Phone  Notes  [place]. [address] [place], [place] [phone]   [facility] Outpatient [address], [place], [place]   ADS: Alcohol & Drug Svcs [address], [place], [place]   [place] [address]  [place], [place] or [phone]   Substance Abuse Resources Organization         Address  Phone  Notes  Alcohol and Drug Services  4844863869   [place]  [phone]   [facility] [place]    Chinita Pester  [phone]   Residential & Outpatient Substance Abuse Program  [phone]   Psychological Services Organization         Address  Phone  Notes  [facility]  [address]  [place] [address] [place] or [phone]    Mobile Crisis Teams Organization         Address  Phone  Notes  [facility], Mobile Crisis Care Unit  [phone]   [facility] Psychotherapeutic Services  [address] [place], [place]   [place] Levels [address], [place] [phone]    Self-Help/Support Groups Organization         Address  Phone             Notes  [place]. of [place] - variety of support groups  [place] Call for more information  Narcotics Anonymous (NA), [facility] [address], [employer] [place]  2 meetings at this location   Special educational needs teacher         Address  Phone  Notes  ASAP Residential Treatment [place],    [place]  [phone]   [facility]  [address], [place] [phone], [place], [place]   [place], [place] [phone] Admissions: 8am-3pm M-F  Incentives Substance [place] [address]    [place], [place] [phone]   [facility] [address] [place], [place], [place]   [facility] [address],  [place], [place]   [facility] - Intensive Outpatient [address] Kristeen [address], [place], [place]   [facility] ([place] [place].,  [place], [place] [phone] or [phone]   [facility]idential Treatment Services (RTS) [address] [place], [place] Accepts Medicaid  [facility] [place] [address]  [place] [phone] Substance Abuse/Addiction Treatment   [facility] Organization         Address  Phone  Notes  [facility]  [phone]   Domenic Schwab, PhD [address] Arlis Porta [place], [place]   [phone] or [phone]   [place], [place] [phone]   [facility] [address] [place], [place] [phone] Insurance/Medicaid/sponsorship through [facility] and Families [address]                                    [place], [place] [phone] [place] [address], [place] [phone]    Dr. Adele Schilder  [phone]   [facility] Dept. 1) [address],  2) [place] 3)  [place] [address], [place] [phone]  [phone]   [place] [phone] or [phone] (After Hours)

## 2014-02-15 NOTE — ED Provider Notes (Signed)
CSN: [id number]     Arrival date & time 02/15/14  1217 History  This chart was scribed for non-physician practitioner Jamse Mead, PA-C working with Saddie Benders. Dorna Mai, MD by Zettie Pho, ED Scribe. This patient was seen in room TR10C/TR10C and the patient's care was started at 4:28 PM.    Chief Complaint  Patient presents with  . Foot Pain   The history is provided by the patient. No language interpreter was used.   HPI Comments: Danny Hale is a 38 y.o. male who presents to the Emergency Department complaining of a constant, pressure-like pain to the left foot, most localized around the 5th toe, that he states has been ongoing for the past 5 months after a fall. He reports that 5 months ago he stepped on some uneven pavement, causing the left ankle to roll onto the 5th toe. Patient states that he was not evaluated after the incident. He denies any pain to the ankle, but states that the pain to the toe has been progressively worsening. He rates the pain a 7/10 currently, but states that the pain is exacerbated with walking/bearing weight. Patient reports taking Tylenol and Advil at home without significant relief. He denies any potential injury or trauma to the area since then, but states that he is on his feet a lot at his work. Patient denies swelling, numbness, paresthesias. He denies any allergies to medications. Patient has a history of asthma, COPD, and obesity.   Past Medical History  Diagnosis Date  . Asthma   . Obesity   . COPD (chronic obstructive pulmonary disease)   . Emphysema   . Cigarette smoker    Past Surgical History  Procedure Laterality Date  . Ankle surgery     Family History  Problem Relation Age of Onset  . COPD Neg Hx    History  Substance Use Topics  . Smoking status: Current Every Day Smoker  . Smokeless tobacco: Never Used  . Alcohol Use: Yes    Review of Systems  Musculoskeletal: Positive for arthralgias. Negative for joint swelling.   Neurological: Negative for numbness.  All other systems reviewed and are negative.  Allergies  Review of patient's allergies indicates no known allergies.  Home Medications   Prior to Admission medications   Not on File   Triage Vitals: BP 133/72  Pulse 100  Temp(Src) 97.8 F (36.6 C) (Oral)  Resp 18  SpO2 97%  Physical Exam  Nursing note and vitals reviewed. Constitutional: He is oriented to person, place, and time. He appears well-developed and well-nourished. No distress.  HENT:  Head: Normocephalic and atraumatic.  Eyes: Conjunctivae and EOM are normal. Right eye exhibits no discharge. Left eye exhibits no discharge.  Neck: Normal range of motion. Neck supple.  Cardiovascular: Normal rate, regular rhythm, normal heart sounds and intact distal pulses.   Pulses:      Radial pulses are 2+ on the right side, and 2+ on the left side.       Dorsalis pedis pulses are 2+ on the right side, and 2+ on the left side.       Posterior tibial pulses are 2+ on the right side, and 2+ on the left side.  Cap refill less than 3 seconds  Pulmonary/Chest: Effort normal and breath sounds normal. No respiratory distress. He has no wheezes. He has no rales.  Abdominal: He exhibits no distension.  Musculoskeletal: Normal range of motion. He exhibits tenderness.       Feet:  Negative swelling, erythema, inflammation, lesions, sores, deformities identified to the small toe of the left foot or any of the digits of the left foot. Full range of motion noted to the digits of the left foot without difficulty. Full range of motion to the left ankle without difficulty. Tenderness to palpation to the plantar aspect of the mid, left foot.   Neurological: He is alert and oriented to person, place, and time. No cranial nerve deficit. He exhibits normal muscle tone. Coordination normal.  Cranial nerves III-XII grossly intact Strength 5+/5+ to lower extremities bilaterally with resistance applied, equal  distribution noted Strength intact to to the digits of the left foot Sensation intact with differentiation to sharp and dull touch Gait proper, proper balance - negative sway, negative drift, negative step-offs  Skin: Skin is warm and dry.  Psychiatric: He has a normal mood and affect. His behavior is normal.    ED Course  Procedures (including critical care time)  DIAGNOSTIC STUDIES: Oxygen Saturation is 97% on room air, normal by my interpretation.    COORDINATION OF CARE: 4:34 PM- Discussed that x-ray results were negative for fracture or dislocation and that symptoms are likely due to plantar fasciitis. Will buddy tape the toes and discharge patient with a post-op shoe. Advised of further symptomatic care at home. Discussed treatment plan with patient at bedside and patient verbalized agreement.     Labs Review Labs Reviewed - No data to display  Imaging Review Dg Foot Complete Left  02/15/2014   CLINICAL DATA:  Injury 5 months ago.  EXAM: LEFT FOOT - COMPLETE 3+ VIEW  COMPARISON:  None.  FINDINGS: There is no evidence of fracture or dislocation. There is no evidence of arthropathy or other focal bone abnormality. Soft tissues are unremarkable.  IMPRESSION: Negative.   Electronically Signed   By: Kerby Moors M.D.   On: 02/15/2014 13:51     EKG Interpretation None      MDM   Final diagnoses:  Foot sprain  Plantar fasciitis of left foot   Filed Vitals:   02/15/14 1227 02/15/14 1647 02/15/14 1703  BP: 133/72 148/106 130/76  Pulse: 100 85   Temp: 97.8 F (36.6 C) 97.2 F (36.2 C)   TempSrc: Oral Oral   Resp: 18 18   SpO2: 97% 100%    I personally performed the services described in this documentation, which was scribed in my presence. The recorded information has been reviewed and is accurate.  Patient presenting to the ED with left foot pain that has been ongoing for the past 5 months. Negative deformities identified to the small toe of the left foot. Full range  of motion identified. Sensation intact. Pulses palpable and strong. Negative findings of cellulitic infection. Discomfort upon palpation to the plantar aspect of the left foot, mid region. Negative warmth upon palpation or deformities identified. Plain films of left foot negative for acute osseous injury. Patient neurovascularly intact. Negative focal neurological deficits noted. Negative acute osseous injury is noted. Suspicion to be possible sprain, cannot rule out possible tendon injury. Possible beginnings of plantar fasciitis since patient is constantly on his feet. Patient stable, afebrile. Discharged patient. Referred patient to orthopedics. Discussed with patient to rest and stay hydrated. Discussed with patient to rest, ice, elevate. Discussed with patient proper insoles. Patient's foot buddy taped and placed in a postop shoe. Discussed with patient to closely monitor symptoms and if symptoms are to worsen or change to report back to the ED - strict return  instructions given.  Patient agreed to plan of care, understood, all questions answered.   Jamse Mead, PA-C 02/16/14 1152

## 2014-02-18 NOTE — ED Provider Notes (Signed)
Medical screening examination/treatment/procedure(s) were performed by non-physician practitioner and as supervising physician I was immediately available for consultation/collaboration.  Danny Hale. Dorna Mai, MD 02/18/14 2255

## 2014-03-28 ENCOUNTER — Emergency Department (HOSPITAL_COMMUNITY): Payer: Self-pay

## 2014-03-28 ENCOUNTER — Inpatient Hospital Stay (HOSPITAL_COMMUNITY)
Admission: EM | Admit: 2014-03-28 | Discharge: 2014-04-01 | DRG: 191 | Disposition: A | Discharge status: Home / Self Care | Payer: Self-pay | Attending: Internal Medicine | Admitting: Internal Medicine

## 2014-03-28 ENCOUNTER — Encounter (HOSPITAL_COMMUNITY): Payer: Self-pay | Admitting: Emergency Medicine

## 2014-03-28 DIAGNOSIS — T486X5A Adverse effect of antiasthmatics, initial encounter: Secondary | ICD-10-CM | POA: Diagnosis present

## 2014-03-28 DIAGNOSIS — Z6841 Body Mass Index (BMI) 40.0 and over, adult: Secondary | ICD-10-CM

## 2014-03-28 DIAGNOSIS — E860 Dehydration: Secondary | ICD-10-CM | POA: Diagnosis present

## 2014-03-28 DIAGNOSIS — F17200 Nicotine dependence, unspecified, uncomplicated: Secondary | ICD-10-CM | POA: Diagnosis present

## 2014-03-28 DIAGNOSIS — J45901 Unspecified asthma with (acute) exacerbation: Secondary | ICD-10-CM

## 2014-03-28 DIAGNOSIS — E669 Obesity, unspecified: Secondary | ICD-10-CM | POA: Diagnosis present

## 2014-03-28 DIAGNOSIS — Z79899 Other long term (current) drug therapy: Secondary | ICD-10-CM

## 2014-03-28 DIAGNOSIS — F411 Generalized anxiety disorder: Secondary | ICD-10-CM | POA: Diagnosis present

## 2014-03-28 DIAGNOSIS — J449 Chronic obstructive pulmonary disease, unspecified: Principal | ICD-10-CM

## 2014-03-28 DIAGNOSIS — J45902 Unspecified asthma with status asthmaticus: Principal | ICD-10-CM | POA: Diagnosis present

## 2014-03-28 DIAGNOSIS — R000 Tachycardia, unspecified: Secondary | ICD-10-CM | POA: Diagnosis present

## 2014-03-28 DIAGNOSIS — IMO0002 Reserved for concepts with insufficient information to code with codable children: Secondary | ICD-10-CM

## 2014-03-28 LAB — I-STAT TROPONIN, ED: Troponin i, poc: 0 ng/mL (ref 0.00–0.08)

## 2014-03-28 LAB — BASIC METABOLIC PANEL
BUN: 14 mg/dL (ref 6–23)
CHLORIDE: 104 meq/L (ref 96–112)
CO2: 27 mEq/L (ref 19–32)
Calcium: 9.2 mg/dL (ref 8.4–10.5)
Creatinine, Ser: 0.98 mg/dL (ref 0.50–1.35)
GFR calc Af Amer: >90 mL/min (ref >90)
GFR calc non Af Amer: >90 mL/min (ref >90)
Glucose, Bld: 118 mg/dL — ABNORMAL HIGH (ref 70–99)
POTASSIUM: 3.4 meq/L — AB (ref 3.7–5.3)
SODIUM: 144 meq/L (ref 137–147)

## 2014-03-28 LAB — CBC
HEMATOCRIT: 36.4 % — AB (ref 39.0–52.0)
Hemoglobin: 11.8 g/dL — ABNORMAL LOW (ref 13.0–17.0)
MCH: 28.6 pg (ref 26.0–34.0)
MCHC: 32.4 g/dL (ref 30.0–36.0)
MCV: 88.3 fL (ref 78.0–100.0)
Platelets: 164 10*3/uL (ref 150–400)
RBC: 4.12 MIL/uL — AB (ref 4.22–5.81)
RDW: 13.5 % (ref 11.5–15.5)
WBC: 6.7 10*3/uL (ref 4.0–10.5)

## 2014-03-28 LAB — RAPID STREP SCREEN (MED CTR MEBANE ONLY): STREPTOCOCCUS, GROUP A SCREEN (DIRECT): NEGATIVE

## 2014-03-28 MED ORDER — ALBUTEROL (5 MG/ML) CONTINUOUS INHALATION SOLN
10.0000 mg/h | INHALATION_SOLUTION | Freq: Once | RESPIRATORY_TRACT | Status: AC
  Administered 2014-03-28: 10 mg/h via RESPIRATORY_TRACT

## 2014-03-28 MED ORDER — SODIUM CHLORIDE 0.9 % IV BOLUS (SEPSIS)
1000.0000 mL | Freq: Once | INTRAVENOUS | Status: AC
  Administered 2014-03-28: 1000 mL via INTRAVENOUS

## 2014-03-28 MED ORDER — SODIUM CHLORIDE 0.9 % IJ SOLN
3.0000 mL | Freq: Two times a day (BID) | INTRAMUSCULAR | Status: DC
  Administered 2014-03-29 – 2014-04-01 (×8): 3 mL via INTRAVENOUS

## 2014-03-28 MED ORDER — METHYLPREDNISOLONE SODIUM SUCC 125 MG IJ SOLR
125.0000 mg | Freq: Once | INTRAMUSCULAR | Status: AC
  Administered 2014-03-28: 125 mg via INTRAVENOUS
  Filled 2014-03-28: qty 2

## 2014-03-28 MED ORDER — ALBUTEROL SULFATE (2.5 MG/3ML) 0.083% IN NEBU
2.5000 mg | INHALATION_SOLUTION | RESPIRATORY_TRACT | Status: DC | PRN
  Administered 2014-03-29: 2.5 mg via RESPIRATORY_TRACT
  Filled 2014-03-28: qty 3

## 2014-03-28 MED ORDER — IPRATROPIUM-ALBUTEROL 0.5-2.5 (3) MG/3ML IN SOLN
3.0000 mL | RESPIRATORY_TRACT | Status: DC
  Administered 2014-03-29 – 2014-04-01 (×21): 3 mL via RESPIRATORY_TRACT
  Filled 2014-03-28 (×22): qty 3

## 2014-03-28 MED ORDER — ALBUTEROL (5 MG/ML) CONTINUOUS INHALATION SOLN
10.0000 mg/h | INHALATION_SOLUTION | Freq: Once | RESPIRATORY_TRACT | Status: AC
  Administered 2014-03-28: 10 mg/h via RESPIRATORY_TRACT
  Filled 2014-03-28: qty 20

## 2014-03-28 MED ORDER — METHYLPREDNISOLONE SODIUM SUCC 125 MG IJ SOLR: 60.0000 mg | Freq: Four times a day (QID) | INTRAMUSCULAR | Status: DC

## 2014-03-28 MED ORDER — LORAZEPAM 2 MG/ML IJ SOLN
0.5000 mg | Freq: Once | INTRAMUSCULAR | Status: AC
  Administered 2014-03-28: 0.5 mg via INTRAVENOUS
  Filled 2014-03-28: qty 1

## 2014-03-28 MED ORDER — METHYLPREDNISOLONE SODIUM SUCC 125 MG IJ SOLR: 60.0000 mg | Freq: Two times a day (BID) | INTRAMUSCULAR | Status: DC

## 2014-03-28 MED ORDER — HEPARIN SODIUM (PORCINE) 5000 UNIT/ML IJ SOLN
5000.0000 [IU] | Freq: Three times a day (TID) | INTRAMUSCULAR | Status: DC
  Administered 2014-03-29 – 2014-04-01 (×10): 5000 [IU] via SUBCUTANEOUS
  Filled 2014-03-28 (×13): qty 1

## 2014-03-28 MED ORDER — ALBUTEROL SULFATE (2.5 MG/3ML) 0.083% IN NEBU
5.0000 mg | INHALATION_SOLUTION | Freq: Once | RESPIRATORY_TRACT | Status: AC
  Administered 2014-03-28: 5 mg via RESPIRATORY_TRACT
  Filled 2014-03-28: qty 6

## 2014-03-28 NOTE — ED Notes (Signed)
X-ray notified that the pt has finished his breathing treatment, and is ready for his x-ray.

## 2014-03-28 NOTE — ED Notes (Signed)
Attempted report 

## 2014-03-28 NOTE — Progress Notes (Signed)
Attempted to received report from Vicente Males, [place] in ED. RN with another pt. Will call back in 5 minutes. Amaryllis Dyke, RN

## 2014-03-28 NOTE — ED Notes (Signed)
Breathing treatment completed.  Continues to have exp wheezes through out.

## 2014-03-28 NOTE — ED Provider Notes (Signed)
CSN: [id number]     Arrival date & time 03/28/14  1911 History   First MD Initiated Contact with Patient 03/28/14 1954     Chief Complaint  Patient presents with  . Shortness of Breath     (Consider location/radiation/quality/duration/timing/severity/associated sxs/prior Treatment) HPI Comments:  38 year old male with history of asthma requiring admission for asthma, recent diagnosis of COPD not currently on prednisone in no current antibiotics except today to the left over pill presents with worsening shortness of breath since yesterday. Patient at times related to weather. Similar to previous asthma/COPD history.  Nothing improves his symptoms, patient tried his neb and did not improve. No cardiac history, no recent surgery, leg pain or leg swelling, blood clot history, recent fevers, long travel. Patient is a productive cough since yesterday.  Patient is a 38 y.o. male presenting with shortness of breath. The history is provided by the patient.  Shortness of Breath Associated symptoms: cough   Associated symptoms: no abdominal pain, no chest pain, no fever, no headaches, no neck pain, no rash and no vomiting     Past Medical History  Diagnosis Date  . Asthma   . Obesity   . COPD (chronic obstructive pulmonary disease)   . Emphysema   . Cigarette smoker    Past Surgical History  Procedure Laterality Date  . Ankle surgery     Family History  Problem Relation Age of Onset  . COPD Neg Hx    History  Substance Use Topics  . Smoking status: Current Every Day Smoker  . Smokeless tobacco: Never Used  . Alcohol Use: Yes    Review of Systems  Constitutional: Negative for fever and chills.  HENT: Negative for congestion.   Eyes: Negative for visual disturbance.  Respiratory: Positive for cough and shortness of breath.   Cardiovascular: Negative for chest pain and leg swelling.  Gastrointestinal: Negative for vomiting and abdominal pain.  Genitourinary: Negative for dysuria and  flank pain.  Musculoskeletal: Negative for back pain, neck pain and neck stiffness.  Skin: Negative for rash.  Neurological: Positive for light-headedness. Negative for headaches.      Allergies  Review of patient's allergies indicates no known allergies.  Home Medications   Prior to Admission medications   Medication Sig Start Date End Date Taking? Authorizing Provider  acetaminophen (TYLENOL) 500 MG tablet Take 1,000 mg by mouth every 6 (six) hours as needed for moderate pain.   Yes Historical Provider, MD  PRESCRIPTION MEDICATION Take 1 tablet by mouth daily.   Yes Historical Provider, MD   BP 114/82  Pulse 116  Temp(Src) 98.1 F (36.7 C) (Oral)  Resp 24  Ht 6\' 1"  (1.854 m)  Wt 338 lb (153.316 kg)  BMI 44.60 kg/m2  SpO2 94% Physical Exam  Nursing note and vitals reviewed. Constitutional: He is oriented to person, place, and time. He appears well-developed and well-nourished.  HENT:  Head: Normocephalic and atraumatic.  Mild dry mucous membranes  Eyes: Conjunctivae are normal. Right eye exhibits no discharge. Left eye exhibits no discharge.  Neck: Normal range of motion. Neck supple. No tracheal deviation present.  Cardiovascular: Regular rhythm.  Tachycardia present.   Pulmonary/Chest: He has wheezes (mild super sternal retractions, expiratory wheezes bilateral).  Abdominal: Soft. He exhibits no distension. There is no tenderness. There is no guarding.  Musculoskeletal: He exhibits no edema and no tenderness.  Neurological: He is alert and oriented to person, place, and time.  Skin: Skin is warm. No rash noted.  Psychiatric:  He has a normal mood and affect.    ED Course  Procedures (including critical care time) CRITICAL CARE Performed by: Mariea Clonts   Total critical care time: 30 min  Critical care time was exclusive of separately billable procedures and treating other patients.  Critical care was necessary to treat or prevent imminent or  life-threatening deterioration.  Critical care was time spent personally by me on the following activities: development of treatment plan with patient and/or surrogate as well as nursing, discussions with consultants, evaluation of patient's response to treatment, examination of patient, obtaining history from patient or surrogate, ordering and performing treatments and interventions, ordering and review of laboratory studies, ordering and review of radiographic studies, pulse oximetry and re-evaluation of patient's condition.  Labs Review Labs Reviewed  BASIC METABOLIC PANEL - Abnormal; Notable for the following:    Potassium 3.4 (*)    Glucose, Bld 118 (*)    All other components within normal limits  CBC - Abnormal; Notable for the following:    RBC 4.12 (*)    Hemoglobin 11.8 (*)    HCT 36.4 (*)    All other components within normal limits  RAPID STREP SCREEN  I-STAT TROPOININ, ED    Imaging Review No results found.   EKG Interpretation None     EKG reviewed RAte 102, sinus, normal axis, no acute ST changes, mild prolonged QT interval. MDM   Final diagnoses:  Status asthmaticus with COPD (chronic obstructive pulmonary disease)  Status asthmaticus   Clinically COPD/asthma exacerbation as similar previous and long findings significant. Mild tachycardia likely from albuterol and work of breathing/dehydration. Continuous neb, steroids, chest x-ray and labs ordered.  Patient with no significant improvement on recheck with continuous neb. Repeat continuous neb ordered. Chest x-ray reviewed no acute findings.pt still tachypnic and tachy on recheck.  Patient received Solu-Medrol today. Discuss case with Dr. Alcario Drought who accepted.  The patients results and plan were reviewed and discussed.   Any x-rays performed were personally reviewed by myself.   Differential diagnosis were considered with the presenting HPI.  Medications  albuterol (PROVENTIL,VENTOLIN) solution continuous  neb (not administered)  LORazepam (ATIVAN) injection 0.5 mg (not administered)  albuterol (PROVENTIL) (2.5 MG/3ML) 0.083% nebulizer solution 2.5 mg (not administered)  heparin injection 5,000 Units (not administered)  sodium chloride 0.9 % injection 3 mL (not administered)  albuterol (PROVENTIL) (2.5 MG/3ML) 0.083% nebulizer solution 5 mg (5 mg Nebulization Given 03/28/14 1925)  methylPREDNISolone sodium succinate (SOLU-MEDROL) 125 mg/2 mL injection 125 mg (125 mg Intravenous Given 03/28/14 2033)  sodium chloride 0.9 % bolus 1,000 mL (0 mLs Intravenous Stopped 03/28/14 2235)  albuterol (PROVENTIL,VENTOLIN) solution continuous neb (10 mg/hr Nebulization Given 03/28/14 2036)  LORazepam (ATIVAN) injection 0.5 mg (0.5 mg Intravenous Given 03/28/14 2032)     Filed Vitals:   03/28/14 2051 03/28/14 2115 03/28/14 2215 03/28/14 2302  BP: 138/76 133/72 134/67 133/110  Pulse:  99 106 102  Temp:      TempSrc:      Resp: 18 13 18 16   Height:      Weight:      SpO2: 100% 100% 89% 100%    Admission/ observation were discussed with the admitting physician, patient and/or family and they are comfortable with the plan.      Mariea Clonts, MD 03/28/14 [phone]

## 2014-03-28 NOTE — ED Notes (Signed)
Transport came to take pt for his x-ray. This RN told transport that she would call when the pt is finished with his breathing treatment.

## 2014-03-28 NOTE — ED Notes (Signed)
Patient presents stating he woke up last night with difficulty breathing.  States he is coughing up thick dark mucous

## 2014-03-28 NOTE — H&P (Signed)
[facility] History and Physical  Danny Hale EPP:[id number] DOB: January 08, 1976 DOA: 03/28/2014  Referring physician: EDP PCP: No PCP Per Patient   Chief Complaint: SOB   HPI: Danny Hale is a 38 y.o. male with h/o asthma who presents to the ED with SOB onset yesterday.  Symptoms similar / identical to previous asthma attacks.  Nothing seems to make symptoms better, home neb didn't help much.  He presented to ED for symptoms.  Review of Systems: Systems reviewed.  As above, otherwise negative  Past Medical History  Diagnosis Date  . Asthma   . Obesity   . COPD (chronic obstructive pulmonary disease)   . Emphysema   . Cigarette smoker    Past Surgical History  Procedure Laterality Date  . Ankle surgery     Social History:  reports that he has been smoking.  He has never used smokeless tobacco. He reports that he drinks alcohol. He reports that he does not use illicit drugs.  No Known Allergies  Family History  Problem Relation Age of Onset  . COPD Neg Hx      Prior to Admission medications   Medication Sig Start Date End Date Taking? Authorizing Provider  acetaminophen (TYLENOL) 500 MG tablet Take 1,000 mg by mouth every 6 (six) hours as needed for moderate pain.   Yes Historical Provider, MD  PRESCRIPTION MEDICATION Take 1 tablet by mouth daily.   Yes Historical Provider, MD   Physical Exam: Filed Vitals:   03/28/14 2302  BP: 133/110  Pulse: 102  Temp:   Resp: 16    BP 133/110  Pulse 102  Temp(Src) 98.1 F (36.7 C) (Oral)  Resp 16  Ht 6\' 1"  (1.854 m)  Wt 153.316 kg (338 lb)  BMI 44.60 kg/m2  SpO2 100%  General Appearance:    Alert, oriented, no distress, appears stated age  Head:    Normocephalic, atraumatic  Eyes:    PERRL, EOMI, sclera non-icteric        Nose:   Nares without drainage or epistaxis. Mucosa, turbinates normal  Throat:   Moist mucous membranes. Oropharynx without erythema or exudate.  Neck:   Supple. No carotid bruits.   No thyromegaly.  No lymphadenopathy.   Back:     No CVA tenderness, no spinal tenderness  Lungs:     Diffuse wheezing  Chest wall:    No tenderness to palpitation  Heart:    Regular rate and rhythm without murmurs, gallops, rubs  Abdomen:     Soft, non-tender, nondistended, normal bowel sounds, no organomegaly  Genitalia:    deferred  Rectal:    deferred  Extremities:   No clubbing, cyanosis or edema.  Pulses:   2+ and symmetric all extremities  Skin:   Skin color, texture, turgor normal, no rashes or lesions  Lymph nodes:   Cervical, supraclavicular, and axillary nodes normal  Neurologic:   CNII-XII intact. Normal strength, sensation and reflexes      throughout    Labs on Admission:  Basic Metabolic Panel:  Recent Labs Lab 03/28/14 2015  NA 144  K 3.4*  CL 104  CO2 27  GLUCOSE 118*  BUN 14  CREATININE 0.98  CALCIUM 9.2   Liver Function Tests: No results found for this basename: AST, ALT, ALKPHOS, BILITOT, PROT, ALBUMIN,  in the last 168 hours No results found for this basename: LIPASE, AMYLASE,  in the last 168 hours No results found for this basename: AMMONIA,  in the  last 168 hours CBC:  Recent Labs Lab 03/28/14 2015  WBC 6.7  HGB 11.8*  HCT 36.4*  MCV 88.3  PLT 164   Cardiac Enzymes: No results found for this basename: CKTOTAL, CKMB, CKMBINDEX, TROPONINI,  in the last 168 hours  BNP (last 3 results)  Recent Labs  04/15/13 0005 09/02/13 2201  PROBNP 140.6* 25.3   CBG: No results found for this basename: GLUCAP,  in the last 168 hours  Radiological Exams on Admission: No results found.  EKG: Independently reviewed.  Assessment/Plan Active Problems:   Status asthmaticus with COPD (chronic obstructive pulmonary disease)   1. Status asthmaticus with COPD - solumedrol 60 q12h IV, adult wheeze protocol, nebs per RRT, O2 via [place].    Code Status: Full Code  Family Communication: Family at bedside Disposition Plan: Admit to obs   Time spent: 50  min  GARDNER, JARED M. [facility] Pager [phone]  If 7AM-7PM, please contact the day team taking care of the patient [email or web address] Password [email or web address] 03/28/2014, 11:11 PM

## 2014-03-28 NOTE — Progress Notes (Signed)
Received report from Webb Silversmith, RN in ED.

## 2014-03-29 ENCOUNTER — Encounter (HOSPITAL_COMMUNITY): Payer: Self-pay | Admitting: General Practice

## 2014-03-29 DIAGNOSIS — J45901 Unspecified asthma with (acute) exacerbation: Secondary | ICD-10-CM

## 2014-03-29 MED ORDER — METHYLPREDNISOLONE SODIUM SUCC 40 MG IJ SOLR
40.0000 mg | Freq: Three times a day (TID) | INTRAMUSCULAR | Status: DC
  Administered 2014-03-29 – 2014-03-31 (×6): 40 mg via INTRAVENOUS
  Filled 2014-03-29 (×9): qty 1

## 2014-03-29 MED ORDER — LORAZEPAM 2 MG/ML IJ SOLN
0.5000 mg | Freq: Once | INTRAMUSCULAR | Status: AC
  Administered 2014-03-29: 0.5 mg via INTRAVENOUS
  Filled 2014-03-29: qty 1

## 2014-03-29 MED ORDER — FUROSEMIDE 10 MG/ML IJ SOLN
40.0000 mg | Freq: Once | INTRAMUSCULAR | Status: AC
  Administered 2014-03-29: 40 mg via INTRAVENOUS
  Filled 2014-03-29: qty 4

## 2014-03-29 MED ORDER — LEVOFLOXACIN 750 MG PO TABS
750.0000 mg | ORAL_TABLET | Freq: Every day | ORAL | Status: DC
  Administered 2014-03-29 – 2014-04-01 (×4): 750 mg via ORAL
  Filled 2014-03-29 (×4): qty 1

## 2014-03-29 MED ORDER — HYDROCODONE-ACETAMINOPHEN 7.5-325 MG PO TABS
1.0000 | ORAL_TABLET | Freq: Four times a day (QID) | ORAL | Status: DC | PRN
  Administered 2014-03-29 – 2014-03-30 (×7): 1 via ORAL
  Filled 2014-03-29 (×8): qty 1

## 2014-03-29 MED ORDER — NICOTINE 21 MG/24HR TD PT24
21.0000 mg | MEDICATED_PATCH | Freq: Every day | TRANSDERMAL | Status: DC
  Administered 2014-03-30 – 2014-04-01 (×3): 21 mg via TRANSDERMAL
  Filled 2014-03-29 (×4): qty 1

## 2014-03-29 MED ORDER — BIOTENE DRY MOUTH MT LIQD
15.0000 mL | Freq: Two times a day (BID) | OROMUCOSAL | Status: DC
  Administered 2014-03-29 – 2014-03-31 (×5): 15 mL via OROMUCOSAL

## 2014-03-29 MED ORDER — HYDROCODONE-ACETAMINOPHEN 5-325 MG PO TABS
2.0000 | ORAL_TABLET | Freq: Once | ORAL | Status: AC
  Administered 2014-03-29: 2 via ORAL
  Filled 2014-03-29: qty 2

## 2014-03-29 NOTE — Progress Notes (Signed)
UR Completed.  Danny Hale [name] [phone] 03/29/2014  

## 2014-03-29 NOTE — Progress Notes (Signed)
New order obtained for Nicoderm patch per pt request. Patch not applied because patient girlfriend brought him in patches from home and he put one of those on himself. Pt still has the box in his room. Patch is 21mg .

## 2014-03-29 NOTE — Progress Notes (Signed)
PATIENT DETAILS Name: Danny Hale Age: 38 y.o. Sex: male Date of Birth: May 12, 1976 Admit Date: 03/28/2014 Admitting Physician Etta Quill, DO PCP:No PCP Per Patient  Subjective: Feeling better-still not at baseline.  Assessment/Plan: Active Problems: Exacerbation of Asthma -improving with Steroids, nebs, add Levaquin -appears comfortable and not in any distress, moving air but still with wheeze. Remain hospitalized for another 24 hours, suspect if improvement continues, will be ready for discharge in am.  Tobacco Abuse -counseled extensively  Disposition: Remain inpatient  DVT Prophylaxis: Prophylactic Heparin   Code Status: Full code  Family Communication None at bedside  Procedures:  None  CONSULTS:  None  Time spent 40 minutes-which includes 50% of the time with face-to-face with patient/ family and coordinating care related to the above assessment and plan.    MEDICATIONS: Scheduled Meds: . antiseptic oral rinse  15 mL Mouth Rinse BID  . heparin  5,000 Units Subcutaneous 3 times per day  . ipratropium-albuterol  3 mL Nebulization Q4H  . sodium chloride  3 mL Intravenous Q12H   Continuous Infusions:  PRN Meds:.albuterol, HYDROcodone-acetaminophen  Antibiotics: Anti-infectives   None       PHYSICAL EXAM: Vital signs in last 24 hours: Filed Vitals:   03/29/14 0021 03/29/14 0313 03/29/14 0443 03/29/14 0855  BP: 142/80  136/80   Pulse: 114  112 106  Temp: 98 F (36.7 C)  97.8 F (36.6 C)   TempSrc: Axillary  Oral   Resp: 16  16 20   Height:      Weight: 161.889 kg (356 lb 14.4 oz)     SpO2: 98% 98% 98% 95%    Weight change:  Filed Weights   03/28/14 1921 03/29/14 0021  Weight: 153.316 kg (338 lb) 161.889 kg (356 lb 14.4 oz)   Body mass index is 47.1 kg/(m^2).   Gen Exam: Awake and alert with clear speech.   Neck: Supple, No JVD.   Chest:Good air entry bilaterally, coarse rhomnchi CVS: S1 S2 Regular, no murmurs.    Abdomen: soft, BS +, non tender, non distended.  Extremities: no edema, lower extremities warm to touch. Neurologic: Non Focal.   Skin: No Rash.   Wounds: N/A.    Intake/Output from previous day:  Intake/Output Summary (Last 24 hours) at 03/29/14 1111 Last data filed at 03/29/14 0054  Gross per 24 hour  Intake      3 ml  Output      0 ml  Net      3 ml     LAB RESULTS: CBC  Recent Labs Lab 03/28/14 2015  WBC 6.7  HGB 11.8*  HCT 36.4*  PLT 164  MCV 88.3  MCH 28.6  MCHC 32.4  RDW 13.5    Chemistries   Recent Labs Lab 03/28/14 2015  NA 144  K 3.4*  CL 104  CO2 27  GLUCOSE 118*  BUN 14  CREATININE 0.98  CALCIUM 9.2    CBG: No results found for this basename: GLUCAP,  in the last 168 hours  GFR Estimated Creatinine Clearance: 167.7 ml/min (by C-G formula based on Cr of 0.98).  Coagulation profile No results found for this basename: INR, PROTIME,  in the last 168 hours  Cardiac Enzymes No results found for this basename: CK, CKMB, TROPONINI, MYOGLOBIN,  in the last 168 hours  No components found with this basename: POCBNP,  No results found for this basename: DDIMER,  in the last 72 hours  No results found for this basename: HGBA1C,  in the last 72 hours No results found for this basename: CHOL, HDL, LDLCALC, TRIG, CHOLHDL, LDLDIRECT,  in the last 72 hours No results found for this basename: TSH, T4TOTAL, FREET3, T3FREE, THYROIDAB,  in the last 72 hours No results found for this basename: VITAMINB12, FOLATE, FERRITIN, TIBC, IRON, RETICCTPCT,  in the last 72 hours No results found for this basename: LIPASE, AMYLASE,  in the last 72 hours  Urine Studies No results found for this basename: UACOL, UAPR, USPG, UPH, UTP, UGL, UKET, UBIL, UHGB, UNIT, UROB, ULEU, UEPI, UWBC, URBC, UBAC, CAST, CRYS, UCOM, BILUA,  in the last 72 hours  MICROBIOLOGY: Recent Results (from the past 240 hour(s))  RAPID STREP SCREEN     Status: None   Collection Time    03/28/14  11:00 PM      Result Value Ref Range Status   Streptococcus, Group A Screen (Direct) NEGATIVE  NEGATIVE Final   Comment: (NOTE)     A Rapid Antigen test may result negative if the antigen level in the     sample is below the detection level of this test. The FDA has not     cleared this test as a stand-alone test therefore the rapid antigen     negative result has reflexed to a Group A Strep culture.    RADIOLOGY STUDIES/RESULTS: Dg Chest 2 View (if Patient Has Fever And/or Copd)  03/28/2014   CLINICAL DATA:  Short of breath.  Asthma.  Emphysema.  EXAM: CHEST  2 VIEW  COMPARISON:  09/02/2013.  FINDINGS: The cardiopericardial silhouette is enlarged. Pulmonary vascular congestion is present. No effusion or airspace disease. Mediastinal contours appear within normal limits.  IMPRESSION: Cardiomegaly and pulmonary vascular congestion suggesting mild CHF.   Electronically Signed   By: Dereck Ligas M.D.   On: 03/28/2014 23:27    Oren Binet, MD  [facility] Pager:[phone]  If 7PM-7AM, please contact night-coverage [email or web address] Password [email or web address] 03/29/2014, 11:11 AM   LOS: 1 day   **Disclaimer: This note may have been dictated with voice recognition software. Similar sounding words can inadvertently be transcribed and this note may contain transcription errors which may not have been corrected upon publication of note.**

## 2014-03-30 DIAGNOSIS — F411 Generalized anxiety disorder: Secondary | ICD-10-CM

## 2014-03-30 DIAGNOSIS — F17200 Nicotine dependence, unspecified, uncomplicated: Secondary | ICD-10-CM

## 2014-03-30 LAB — CULTURE, GROUP A STREP

## 2014-03-30 MED ORDER — ALBUTEROL SULFATE (2.5 MG/3ML) 0.083% IN NEBU: 2.5000 mg | INHALATION_SOLUTION | RESPIRATORY_TRACT | Status: DC | PRN

## 2014-03-30 MED ORDER — ALPRAZOLAM 0.25 MG PO TABS
0.2500 mg | ORAL_TABLET | Freq: Three times a day (TID) | ORAL | Status: DC | PRN
  Administered 2014-03-30 – 2014-04-01 (×6): 0.25 mg via ORAL
  Filled 2014-03-30 (×6): qty 1

## 2014-03-30 MED ORDER — FUROSEMIDE 10 MG/ML IJ SOLN
20.0000 mg | Freq: Once | INTRAMUSCULAR | Status: AC
  Administered 2014-03-30: 20 mg via INTRAVENOUS
  Filled 2014-03-30: qty 2

## 2014-03-30 MED ORDER — POTASSIUM CHLORIDE CRYS ER 20 MEQ PO TBCR
40.0000 meq | EXTENDED_RELEASE_TABLET | Freq: Once | ORAL | Status: AC
  Administered 2014-03-30: 40 meq via ORAL
  Filled 2014-03-30: qty 2

## 2014-03-30 MED ORDER — FUROSEMIDE 10 MG/ML IJ SOLN: 40.0000 mg | Freq: Once | INTRAMUSCULAR | Status: DC

## 2014-03-30 MED ORDER — BENZONATATE 100 MG PO CAPS
200.0000 mg | ORAL_CAPSULE | Freq: Three times a day (TID) | ORAL | Status: DC | PRN
  Administered 2014-03-30 – 2014-04-01 (×4): 200 mg via ORAL
  Filled 2014-03-30 (×5): qty 2

## 2014-03-30 NOTE — Progress Notes (Signed)
PATIENT DETAILS Name: Danny Hale Age: 38 y.o. Sex: male Date of Birth: [birth year] Admit Date: 03/28/2014 Admitting Physician Etta Quill, DO PCP:No PCP Per Patient  Subjective: Feeling better-had an episode of bronchospasm last night  Assessment/Plan: Active Problems: Exacerbation of Asthma -overall improved with Steroids, nebs and levaquin Levaquin -appears comfortable and not in any distress, moving air but still with wheeze.  -suspect will need another 1-2 days of hospitalization,have asked RN to wean off 02  Anxiety -prn Xanax -stable for now  Tobacco Abuse -counseled extensively  Disposition: Remain inpatient  DVT Prophylaxis: Prophylactic Heparin   Code Status: Full code  Family Communication None at bedside  Procedures:  None  CONSULTS:  None  MEDICATIONS: Scheduled Meds: . antiseptic oral rinse  15 mL Mouth Rinse BID  . furosemide  20 mg Intravenous Once  . heparin  5,000 Units Subcutaneous 3 times per day  . ipratropium-albuterol  3 mL Nebulization Q4H  . levofloxacin  750 mg Oral Daily  . methylPREDNISolone (SOLU-MEDROL) injection  40 mg Intravenous 3 times per day  . nicotine  21 mg Transdermal Daily  . sodium chloride  3 mL Intravenous Q12H   Continuous Infusions:  PRN Meds:.albuterol, ALPRAZolam, HYDROcodone-acetaminophen  Antibiotics: Anti-infectives   Start     Dose/Rate Route Frequency Ordered Stop   03/29/14 1200  levofloxacin (LEVAQUIN) tablet 750 mg     750 mg Oral Daily 03/29/14 1113         PHYSICAL EXAM: Vital signs in last 24 hours: Filed Vitals:   03/29/14 2148 03/29/14 2358 03/30/14 0431 03/30/14 0852  BP: 146/89  135/76   Pulse: 121  109 106  Temp: 97.9 F (36.6 C)  97.9 F (36.6 C)   TempSrc: Oral  Oral   Resp: 20  20 20   Height:      Weight:      SpO2: 94% 99% 95% 92%    Weight change:  Filed Weights   03/28/14 1921 03/29/14 0021  Weight: 153.316 kg (338 lb) 161.889 kg (356 lb 14.4  oz)   Body mass index is 47.1 kg/(m^2).   Gen Exam: Awake and alert with clear speech.   Neck: Supple, No JVD.   Chest:Good air entry bilaterally, scattered rhonchi CVS: S1 S2 Regular, no murmurs.  Abdomen: soft, BS +, non tender, non distended.  Extremities: no edema, lower extremities warm to touch. Neurologic: Non Focal.   Skin: No Rash.   Wounds: N/A.    Intake/Output from previous day: No intake or output data in the 24 hours ending 03/30/14 0908   LAB RESULTS: CBC  Recent Labs Lab 03/28/14 2015  WBC 6.7  HGB 11.8*  HCT 36.4*  PLT 164  MCV 88.3  MCH 28.6  MCHC 32.4  RDW 13.5    Chemistries   Recent Labs Lab 03/28/14 2015  NA 144  K 3.4*  CL 104  CO2 27  GLUCOSE 118*  BUN 14  CREATININE 0.98  CALCIUM 9.2    CBG: No results found for this basename: GLUCAP,  in the last 168 hours  GFR Estimated Creatinine Clearance: 167.7 ml/min (by C-G formula based on Cr of 0.98).  Coagulation profile No results found for this basename: INR, PROTIME,  in the last 168 hours  Cardiac Enzymes No results found for this basename: CK, CKMB, TROPONINI, MYOGLOBIN,  in the last 168 hours  No components found with this basename: POCBNP,  No results found for  this basename: DDIMER,  in the last 72 hours No results found for this basename: HGBA1C,  in the last 72 hours No results found for this basename: CHOL, HDL, LDLCALC, TRIG, CHOLHDL, LDLDIRECT,  in the last 72 hours No results found for this basename: TSH, T4TOTAL, FREET3, T3FREE, THYROIDAB,  in the last 72 hours No results found for this basename: VITAMINB12, FOLATE, FERRITIN, TIBC, IRON, RETICCTPCT,  in the last 72 hours No results found for this basename: LIPASE, AMYLASE,  in the last 72 hours  Urine Studies No results found for this basename: UACOL, UAPR, USPG, UPH, UTP, UGL, UKET, UBIL, UHGB, UNIT, UROB, ULEU, UEPI, UWBC, URBC, UBAC, CAST, CRYS, UCOM, BILUA,  in the last 72 hours  MICROBIOLOGY: Recent Results  (from the past 240 hour(s))  RAPID STREP SCREEN     Status: None   Collection Time    03/28/14 11:00 PM      Result Value Ref Range Status   Streptococcus, Group A Screen (Direct) NEGATIVE  NEGATIVE Final   Comment: (NOTE)     A Rapid Antigen test may result negative if the antigen level in the     sample is below the detection level of this test. The FDA has not     cleared this test as a stand-alone test therefore the rapid antigen     negative result has reflexed to a Group A Strep culture.  CULTURE, GROUP A STREP     Status: None   Collection Time    03/28/14 11:00 PM      Result Value Ref Range Status   Specimen Description THROAT   Final   Special Requests CX ADDED AT 2323 ON [id number]   Final   Culture     Final   Value: NO SUSPICIOUS COLONIES, CONTINUING TO HOLD     Performed at [facility]   Report Status PENDING   Incomplete    RADIOLOGY STUDIES/RESULTS: Dg Chest 2 View (if Patient Has Fever And/or Copd)  03/28/2014   CLINICAL DATA:  Short of breath.  Asthma.  Emphysema.  EXAM: CHEST  2 VIEW  COMPARISON:  09/02/2013.  FINDINGS: The cardiopericardial silhouette is enlarged. Pulmonary vascular congestion is present. No effusion or airspace disease. Mediastinal contours appear within normal limits.  IMPRESSION: Cardiomegaly and pulmonary vascular congestion suggesting mild CHF.   Electronically Signed   By: Dereck Ligas M.D.   On: 03/28/2014 23:27    Oren Binet, MD  [facility] Pager:[phone]  If 7PM-7AM, please contact night-coverage [email or web address] Password [email or web address] 03/30/2014, 9:08 AM   LOS: 2 days   **Disclaimer: This note may have been dictated with voice recognition software. Similar sounding words can inadvertently be transcribed and this note may contain transcription errors which may not have been corrected upon publication of note.**

## 2014-03-31 MED ORDER — HYDROCODONE-ACETAMINOPHEN 5-325 MG PO TABS
2.0000 | ORAL_TABLET | Freq: Once | ORAL | Status: AC
  Administered 2014-03-31: 2 via ORAL
  Filled 2014-03-31: qty 2

## 2014-03-31 MED ORDER — FUROSEMIDE 10 MG/ML IJ SOLN
20.0000 mg | Freq: Once | INTRAMUSCULAR | Status: AC
  Administered 2014-03-31: 20 mg via INTRAVENOUS
  Filled 2014-03-31 (×2): qty 2

## 2014-03-31 MED ORDER — HYDROCODONE-ACETAMINOPHEN 7.5-325 MG PO TABS
1.0000 | ORAL_TABLET | Freq: Four times a day (QID) | ORAL | Status: DC | PRN
  Administered 2014-03-31 – 2014-04-01 (×6): 1 via ORAL
  Filled 2014-03-31 (×6): qty 1

## 2014-03-31 MED ORDER — METHYLPREDNISOLONE SODIUM SUCC 125 MG IJ SOLR
60.0000 mg | Freq: Three times a day (TID) | INTRAMUSCULAR | Status: DC
  Administered 2014-03-31 – 2014-04-01 (×4): 60 mg via INTRAVENOUS
  Filled 2014-03-31 (×6): qty 0.96

## 2014-03-31 NOTE — Progress Notes (Signed)
Pt weaned off of O2, however while he was sleeping his O2 sats dropped to 80%. 1L of oxygen applied.

## 2014-03-31 NOTE — Progress Notes (Signed)
PATIENT DETAILS Name: Danny Hale Age: 38 y.o. Sex: male Date of Birth: [birth year] Admit Date: 03/28/2014 Admitting Physician Etta Quill, DO PCP:No PCP Per Patient  Subjective: No major complaints  Assessment/Plan: Active Problems: Exacerbation of Asthma -overall improved-but slow improvement- continue with Steroids, nebs and levaquin Levaquin -appears comfortable and not in any distress, moving air but still with wheeze.  -suspect will need another 1-2 days of hospitalization,have asked RN to wean off 02  Anxiety -prn Xanax -stable for now  Tobacco Abuse -counseled extensively  Disposition: Remain inpatient  DVT Prophylaxis: Prophylactic Heparin   Code Status: Full code  Family Communication None at bedside  Procedures:  None  CONSULTS:  None  MEDICATIONS: Scheduled Meds: . antiseptic oral rinse  15 mL Mouth Rinse BID  . heparin  5,000 Units Subcutaneous 3 times per day  . ipratropium-albuterol  3 mL Nebulization Q4H  . levofloxacin  750 mg Oral Daily  . methylPREDNISolone (SOLU-MEDROL) injection  40 mg Intravenous 3 times per day  . nicotine  21 mg Transdermal Daily  . sodium chloride  3 mL Intravenous Q12H   Continuous Infusions:  PRN Meds:.albuterol, ALPRAZolam, benzonatate, HYDROcodone-acetaminophen  Antibiotics: Anti-infectives   Start     Dose/Rate Route Frequency Ordered Stop   03/29/14 1200  levofloxacin (LEVAQUIN) tablet 750 mg     750 mg Oral Daily 03/29/14 1113         PHYSICAL EXAM: Vital signs in last 24 hours: Filed Vitals:   03/31/14 0021 03/31/14 0447 03/31/14 0909 03/31/14 1239  BP:  109/68    Pulse: 110 89    Temp:  97.9 F (36.6 C)    TempSrc:  Oral    Resp: 18 20    Height:      Weight:      SpO2:  96% 92% 94%    Weight change:  Filed Weights   03/28/14 1921 03/29/14 0021  Weight: 153.316 kg (338 lb) 161.889 kg (356 lb 14.4 oz)   Body mass index is 47.1 kg/(m^2).   Gen Exam: Awake and  alert with clear speech.   Neck: Supple, No JVD.   Chest:Good air entry bilaterally, still with rhonchi CVS: S1 S2 Regular, no murmurs.  Abdomen: soft, BS +, non tender, non distended.  Extremities: no edema, lower extremities warm to touch. Neurologic: Non Focal.   Skin: No Rash.   Wounds: N/A.    Intake/Output from previous day: No intake or output data in the 24 hours ending 03/31/14 1243   LAB RESULTS: CBC  Recent Labs Lab 03/28/14 2015  WBC 6.7  HGB 11.8*  HCT 36.4*  PLT 164  MCV 88.3  MCH 28.6  MCHC 32.4  RDW 13.5    Chemistries   Recent Labs Lab 03/28/14 2015  NA 144  K 3.4*  CL 104  CO2 27  GLUCOSE 118*  BUN 14  CREATININE 0.98  CALCIUM 9.2    CBG: No results found for this basename: GLUCAP,  in the last 168 hours  GFR Estimated Creatinine Clearance: 167.7 ml/min (by C-G formula based on Cr of 0.98).  Coagulation profile No results found for this basename: INR, PROTIME,  in the last 168 hours  Cardiac Enzymes No results found for this basename: CK, CKMB, TROPONINI, MYOGLOBIN,  in the last 168 hours  No components found with this basename: POCBNP,  No results found for this basename: DDIMER,  in the last 72 hours No results  found for this basename: HGBA1C,  in the last 72 hours No results found for this basename: CHOL, HDL, LDLCALC, TRIG, CHOLHDL, LDLDIRECT,  in the last 72 hours No results found for this basename: TSH, T4TOTAL, FREET3, T3FREE, THYROIDAB,  in the last 72 hours No results found for this basename: VITAMINB12, FOLATE, FERRITIN, TIBC, IRON, RETICCTPCT,  in the last 72 hours No results found for this basename: LIPASE, AMYLASE,  in the last 72 hours  Urine Studies No results found for this basename: UACOL, UAPR, USPG, UPH, UTP, UGL, UKET, UBIL, UHGB, UNIT, UROB, ULEU, UEPI, UWBC, URBC, UBAC, CAST, CRYS, UCOM, BILUA,  in the last 72 hours  MICROBIOLOGY: Recent Results (from the past 240 hour(s))  RAPID STREP SCREEN     Status:  None   Collection Time    03/28/14 11:00 PM      Result Value Ref Range Status   Streptococcus, Group A Screen (Direct) NEGATIVE  NEGATIVE Final   Comment: (NOTE)     A Rapid Antigen test may result negative if the antigen level in the     sample is below the detection level of this test. The FDA has not     cleared this test as a stand-alone test therefore the rapid antigen     negative result has reflexed to a Group A Strep culture.  CULTURE, GROUP A STREP     Status: None   Collection Time    03/28/14 11:00 PM      Result Value Ref Range Status   Specimen Description THROAT   Final   Special Requests CX ADDED AT 2323 ON [id number]   Final   Culture     Final   Value: No Beta Hemolytic Streptococci Isolated     Performed at [facility]   Report Status 03/30/2014 FINAL   Final    RADIOLOGY STUDIES/RESULTS: Dg Chest 2 View (if Patient Has Fever And/or Copd)  03/28/2014   CLINICAL DATA:  Short of breath.  Asthma.  Emphysema.  EXAM: CHEST  2 VIEW  COMPARISON:  09/02/2013.  FINDINGS: The cardiopericardial silhouette is enlarged. Pulmonary vascular congestion is present. No effusion or airspace disease. Mediastinal contours appear within normal limits.  IMPRESSION: Cardiomegaly and pulmonary vascular congestion suggesting mild CHF.   Electronically Signed   By: Dereck Ligas M.D.   On: 03/28/2014 23:27    Oren Binet, MD  [facility] Pager:[phone]  If 7PM-7AM, please contact night-coverage [email or web address] Password [email or web address] 03/31/2014, 12:43 PM   LOS: 3 days   **Disclaimer: This note may have been dictated with voice recognition software. Similar sounding words can inadvertently be transcribed and this note may contain transcription errors which may not have been corrected upon publication of note.**

## 2014-04-01 DIAGNOSIS — M7989 Other specified soft tissue disorders: Secondary | ICD-10-CM

## 2014-04-01 MED ORDER — ALPRAZOLAM 0.25 MG PO TABS: 0.2500 mg | ORAL_TABLET | Freq: Three times a day (TID) | ORAL | Status: DC | PRN

## 2014-04-01 MED ORDER — ALBUTEROL SULFATE HFA 108 (90 BASE) MCG/ACT IN AERS: 2.0000 | INHALATION_SPRAY | RESPIRATORY_TRACT | Status: DC | PRN

## 2014-04-01 MED ORDER — ALBUTEROL SULFATE (2.5 MG/3ML) 0.083% IN NEBU: 2.5000 mg | INHALATION_SOLUTION | RESPIRATORY_TRACT | Status: DC | PRN

## 2014-04-01 MED ORDER — PREDNISONE 10 MG PO TABS: ORAL_TABLET | ORAL | Status: DC

## 2014-04-01 MED ORDER — FLUTICASONE-SALMETEROL 250-50 MCG/DOSE IN AEPB: 1.0000 | INHALATION_SPRAY | Freq: Two times a day (BID) | RESPIRATORY_TRACT | Status: DC

## 2014-04-01 MED ORDER — HYDROCODONE-ACETAMINOPHEN 7.5-325 MG PO TABS: 1.0000 | ORAL_TABLET | Freq: Four times a day (QID) | ORAL | Status: DC | PRN

## 2014-04-01 NOTE — Discharge Summary (Signed)
PATIENT DETAILS Name: Danny Hale Age: 38 y.o. Sex: male Date of Birth: 07/30/1976 MRN: [id number]. Admit Date: 03/28/2014 Admitting Physician: Etta Quill, DO PCP:No PCP Per Patient  Recommendations for Outpatient Follow-up:  1. Needs outpatient Sleep Study 2. General Health Maintenance 3. Needs Ongoing Tobacco cessation counselling  PRIMARY DISCHARGE DIAGNOSIS:  Active Problems:   Status asthmaticus with COPD (chronic obstructive pulmonary disease)      PAST MEDICAL HISTORY: Past Medical History  Diagnosis Date  . Asthma   . Obesity   . COPD (chronic obstructive pulmonary disease)   . Emphysema   . Cigarette smoker     DISCHARGE MEDICATIONS:   Medication List    STOP taking these medications       acetaminophen 500 MG tablet  Commonly known as:  TYLENOL     PRESCRIPTION MEDICATION      TAKE these medications       albuterol 108 (90 BASE) MCG/ACT inhaler  Commonly known as:  PROVENTIL HFA;VENTOLIN HFA  Inhale 2 puffs into the lungs every 4 (four) hours as needed for wheezing or shortness of breath.     albuterol (2.5 MG/3ML) 0.083% nebulizer solution  Commonly known as:  PROVENTIL  Take 3 mLs (2.5 mg total) by nebulization every 4 (four) hours as needed for wheezing or shortness of breath.     ALPRAZolam 0.25 MG tablet  Commonly known as:  XANAX  Take 1 tablet (0.25 mg total) by mouth 3 (three) times daily as needed for anxiety.     Fluticasone-Salmeterol 250-50 MCG/DOSE Aepb  Commonly known as:  ADVAIR DISKUS  Inhale 1 puff into the lungs 2 (two) times daily.     HYDROcodone-acetaminophen 7.5-325 MG per tablet  Commonly known as:  NORCO  Take 1 tablet by mouth every 6 (six) hours as needed for moderate pain.     predniSONE 10 MG tablet  Commonly known as:  DELTASONE  - Take 4 tablets (40 mg) daily for 2 days, then,  - Take 3 tablets (30 mg) daily for 2 days, then,  - Take 2 tablets (20 mg) daily for 2 days, then,  - Take 1 tablets  (10 mg) daily for 1 days, then stop        ALLERGIES:  No Known Allergies  BRIEF HPI:  See H&P, Labs, Consult and Test reports for all details in brief, is a 38 y.o. male with h/o asthma who presents to the ED with SOB.Symptoms similar / identical to previous asthma attacks  CONSULTATIONS:   None  PERTINENT RADIOLOGIC STUDIES: Dg Chest 2 View (if Patient Has Fever And/or Copd)  03/28/2014   CLINICAL DATA:  Short of breath.  Asthma.  Emphysema.  EXAM: CHEST  2 VIEW  COMPARISON:  09/02/2013.  FINDINGS: The cardiopericardial silhouette is enlarged. Pulmonary vascular congestion is present. No effusion or airspace disease. Mediastinal contours appear within normal limits.  IMPRESSION: Cardiomegaly and pulmonary vascular congestion suggesting mild CHF.   Electronically Signed   By: Dereck Ligas M.D.   On: 03/28/2014 23:27     PERTINENT LAB RESULTS: CBC: No results found for this basename: WBC, HGB, HCT, PLT,  in the last 72 hours CMET CMP     Component Value Date/Time   NA 144 03/28/2014 2015   K 3.4* 03/28/2014 2015   CL 104 03/28/2014 2015   CO2 27 03/28/2014 2015   GLUCOSE 118* 03/28/2014 2015   BUN 14 03/28/2014 2015   CREATININE 0.98 03/28/2014 2015  CALCIUM 9.2 03/28/2014 2015   PROT 7.2 09/02/2013 2201   ALBUMIN 3.9 09/02/2013 2201   AST 18 09/02/2013 2201   ALT 28 09/02/2013 2201   ALKPHOS 84 09/02/2013 2201   BILITOT 0.2* 09/02/2013 2201   GFRNONAA >90 03/28/2014 2015   GFRAA >90 03/28/2014 2015    GFR Estimated Creatinine Clearance: 167.7 ml/min (by C-G formula based on Cr of 0.98). No results found for this basename: LIPASE, AMYLASE,  in the last 72 hours No results found for this basename: CKTOTAL, CKMB, CKMBINDEX, TROPONINI,  in the last 72 hours No components found with this basename: POCBNP,  No results found for this basename: DDIMER,  in the last 72 hours No results found for this basename: HGBA1C,  in the last 72 hours No results found for this basename:  CHOL, HDL, LDLCALC, TRIG, CHOLHDL, LDLDIRECT,  in the last 72 hours No results found for this basename: TSH, T4TOTAL, FREET3, T3FREE, THYROIDAB,  in the last 72 hours No results found for this basename: VITAMINB12, FOLATE, FERRITIN, TIBC, IRON, RETICCTPCT,  in the last 72 hours Coags: No results found for this basename: PT, INR,  in the last 72 hours Microbiology: Recent Results (from the past 240 hour(s))  RAPID STREP SCREEN     Status: None   Collection Time    03/28/14 11:00 PM      Result Value Ref Range Status   Streptococcus, Group A Screen (Direct) NEGATIVE  NEGATIVE Final   Comment: (NOTE)     A Rapid Antigen test may result negative if the antigen level in the     sample is below the detection level of this test. The FDA has not     cleared this test as a stand-alone test therefore the rapid antigen     negative result has reflexed to a Group A Strep culture.  CULTURE, GROUP A STREP     Status: None   Collection Time    03/28/14 11:00 PM      Result Value Ref Range Status   Specimen Description THROAT   Final   Special Requests CX ADDED AT 2323 ON [id number]   Final   Culture     Final   Value: No Beta Hemolytic Streptococci Isolated     Performed at [facility]   Report Status 03/30/2014 FINAL   Final     BRIEF HOSPITAL COURSE:  Exacerbation of Asthma  -admitted and started on IV Solumedrol, Nebs, levaquin. Made slow improvement, by day of discharge significantly improved, hardly any wheezing on exam. Tapered off O2, but still intermittently desaturates at night, will be placed on nocturnal O2. Suspect has OSA, and needs sleep study, Case Management will arrange. B/L lower ext dopplers negative for DVT. On discharge, she will be provided with Advair, albuterol inhaler prn, and Albuterol nebs prn. Has been counseled to stop smoking.   Anxiety  -prn Xanax  -stable for now   Tobacco Abuse  -counseled extensively  TODAY-DAY OF DISCHARGE:  Subjective:   Danny Hale today has no headache,no chest abdominal pain,no new weakness tingling or numbness, feels much better wants to go home today.  Objective:   Blood pressure 130/80, pulse 118, temperature 97.5 F (36.4 C), temperature source Oral, resp. rate 18, height 6\' 1"  (1.854 m), weight 161.889 kg (356 lb 14.4 oz), SpO2 94.00%.  Intake/Output Summary (Last 24 hours) at 04/01/14 1411 Last data filed at 03/31/14 1900  Gross per 24 hour  Intake  120 ml  Output      0 ml  Net    120 ml   Filed Weights   03/28/14 1921 03/29/14 0021  Weight: 153.316 kg (338 lb) 161.889 kg (356 lb 14.4 oz)    Exam Awake Alert, Oriented *3, No new F.N deficits, Normal affect [place].AT,PERRAL Supple Neck,No JVD, No cervical lymphadenopathy appriciated.  Symmetrical Chest wall movement, Good air movement bilaterally, CTAB RRR,No Gallops,Rubs or new Murmurs, No Parasternal Heave +ve B.Sounds, Abd Soft, Non tender, No organomegaly appriciated, No rebound -guarding or rigidity. No Cyanosis, Clubbing or edema, No new Rash or bruise  DISCHARGE CONDITION: Stable  DISPOSITION: Home  DISCHARGE INSTRUCTIONS:    Activity:  As tolerated   Follow with Primary MD   and other consultant as instructed your Hospitalist MD  Please get a complete blood count and chemistry panel checked by your Primary MD at your next visit, and again as instructed by your Primary MD.  Get Medicines reviewed and adjusted. Please take all your medications with you for your next visit with your Primary MD  Please request your Primary MD to go over all hospital tests and procedure/radiological results at the follow up, please ask your Primary MD to get all Hospital records sent to his/her office.  If you experience worsening of your admission symptoms, develop shortness of breath, life threatening emergency, suicidal or homicidal thoughts you must seek medical attention immediately by calling 911 or calling your MD immediately  if  symptoms less severe.  You must read complete instructions/literature along with all the possible adverse reactions/side effects for all the Medicines you take and that have been prescribed to you. Take any new Medicines after you have completely understood and accpet all the possible adverse reactions/side effects.   Do not drive, operate heavy machinery or activities at heights when taking Pain medications or Xanax  Do not take more than prescribed Pain, Sleep and Anxiety Medications  Special Instructions: If you have smoked or chewed Tobacco  in the last 2 yrs please stop smoking, stop any regular Alcohol  and or any Recreational drug use.  Wear Seat belts while driving.  Please note  You were cared for by a hospitalist during your hospital stay. Once you are discharged, your primary care physician will handle any further medical issues. Please note that NO REFILLS for any discharge medications will be authorized once you are discharged, as it is imperative that you return to your primary care physician (or establish a relationship with a primary care physician if you do not have one) for your aftercare needs so that they can reassess your need for medications and monitor your lab values.  Diet recommendation: Regular Diet      Discharge Instructions   Diet - low sodium heart healthy    Complete by:  As directed      Increase activity slowly    Complete by:  As directed            Follow-up Information   Follow up with MATTHEWS,MICHELLE A., MD On 05/05/2014. (1;45, bring $20 co pay)    Specialty:  Internal Medicine   Contact information:   [place] [address] [phone]       Follow up with [place]     On 04/22/2014. (3:30 for orange card apt)    Contact information:   [address] [place] [id number] [phone]  Total Time spent on discharge equals 45  minutes.  SignedOren Binet 04/01/2014 2:11 PM  **Disclaimer: This note may have been dictated with voice recognition software. Similar sounding words can inadvertently be transcribed and this note may contain transcription errors which may not have been corrected upon publication of note.**

## 2014-04-01 NOTE — Progress Notes (Signed)
While sleeping, patients O2 sat dropped to 74%. 1L of O2 applied, O2 sat is now 95%. Will continue to monitor.

## 2014-04-01 NOTE — Progress Notes (Signed)
Danny Hale to be D/C'd Home per MD order.  Discussed with the patient and all questions fully answered.    Medication List    STOP taking these medications       acetaminophen 500 MG tablet  Commonly known as:  TYLENOL     PRESCRIPTION MEDICATION      TAKE these medications       albuterol 108 (90 BASE) MCG/ACT inhaler  Commonly known as:  PROVENTIL HFA;VENTOLIN HFA  Inhale 2 puffs into the lungs every 4 (four) hours as needed for wheezing or shortness of breath.     albuterol (2.5 MG/3ML) 0.083% nebulizer solution  Commonly known as:  PROVENTIL  Take 3 mLs (2.5 mg total) by nebulization every 4 (four) hours as needed for wheezing or shortness of breath.     ALPRAZolam 0.25 MG tablet  Commonly known as:  XANAX  Take 1 tablet (0.25 mg total) by mouth 3 (three) times daily as needed for anxiety.     Fluticasone-Salmeterol 250-50 MCG/DOSE Aepb  Commonly known as:  ADVAIR DISKUS  Inhale 1 puff into the lungs 2 (two) times daily.     HYDROcodone-acetaminophen 7.5-325 MG per tablet  Commonly known as:  NORCO  Take 1 tablet by mouth every 6 (six) hours as needed for moderate pain.     predniSONE 10 MG tablet  Commonly known as:  DELTASONE  - Take 4 tablets (40 mg) daily for 2 days, then,  - Take 3 tablets (30 mg) daily for 2 days, then,  - Take 2 tablets (20 mg) daily for 2 days, then,  - Take 1 tablets (10 mg) daily for 1 days, then stop        VVS, Skin clean, dry and intact without evidence of skin break down, no evidence of skin tears noted. IV catheter discontinued intact. Site without signs and symptoms of complications. Dressing and pressure applied.  An After Visit Summary was printed and given to the patient.  D/c education completed with patient/family including follow up instructions, medication list, d/c activities limitations if indicated, with other d/c instructions as indicated by MD - patient able to verbalize understanding, all questions fully  answered.   Patient instructed to return to ED, call 911, or call MD for any changes in condition.   Patient escorted via [place], and D/C home via private auto.  Audria Nine F 04/01/2014 4:14 PM

## 2014-04-01 NOTE — Discharge Instructions (Signed)
Follow with Primary MD    and other consultant as instructed your Hospitalist MD  Please get a complete blood count and chemistry panel checked by your Primary MD at your next visit, and again as instructed by your Primary MD.  Get Medicines reviewed and adjusted. Please take all your medications with you for your next visit with your Primary MD  Please request your Primary MD to go over all hospital tests and procedure/radiological results at the follow up, please ask your Primary MD to get all Hospital records sent to his/her office.  If you experience worsening of your admission symptoms, develop shortness of breath, life threatening emergency, suicidal or homicidal thoughts you must seek medical attention immediately by calling 911 or calling your MD immediately  if symptoms less severe.  You must read complete instructions/literature along with all the possible adverse reactions/side effects for all the Medicines you take and that have been prescribed to you. Take any new Medicines after you have completely understood and accpet all the possible adverse reactions/side effects.   Do not drive, operate heavy machinery or activities at heights when taking Pain medications or Xanax  Do not take more than prescribed Pain, Sleep and Anxiety Medications  Special Instructions: If you have smoked or chewed Tobacco  in the last 2 yrs please stop smoking, stop any regular Alcohol  and or any Recreational drug use.  Wear Seat belts while driving.  Please note  You were cared for by a hospitalist during your hospital stay. Once you are discharged, your primary care physician will handle any further medical issues. Please note that NO REFILLS for any discharge medications will be authorized once you are discharged, as it is imperative that you return to your primary care physician (or establish a relationship with a primary care physician if you do not have one) for your aftercare needs so that they  can reassess your need for medications and monitor your lab values.

## 2014-04-01 NOTE — Progress Notes (Signed)
VASCULAR LAB PRELIMINARY  PRELIMINARY  PRELIMINARY  PRELIMINARY  Bilateral lower extremity venous duplex completed.    Preliminary report:  Bilateral:  No evidence of DVT, superficial thrombosis, or Baker's Cyst.   SLAUGHTER, [place], RVS 04/01/2014, 12:05 PM

## 2014-04-01 NOTE — Progress Notes (Signed)
SATURATION QUALIFICATIONS: (This note is used to comply with regulatory documentation for home oxygen)  Patient Saturations on Room Air at Rest =80 %  Patient Saturations on Room Air while Ambulating =   Patient Saturations on Liters of oxygen while Ambulating =   Please briefly explain why patient needs home oxygen: COPD and asthma

## 2014-04-01 NOTE — Care Management Note (Signed)
    Page 1 of 1   04/01/2014     4:01:39 PM CARE MANAGEMENT NOTE 04/01/2014  Patient:  Danny Hale, Danny Hale   Account Number:  [id number]  Date Initiated:  04/01/2014  Documentation initiated by:  Tomi Bamberger  Subjective/Objective Assessment:   dx copd  admit- lives with family.     Action/Plan:   Anticipated DC Date:  04/01/2014   Anticipated DC Plan:  [place]  CM consult  [facility]      [facility] Choice  DURABLE MEDICAL EQUIPMENT   Choice offered to / List presented to:  C-1 Patient   DME arranged  [facility]      DME agency  [place].        Status of service:  Completed, signed off Medicare Important Message given?   (If response is "NO", the following Medicare IM given date fields will be blank) Date Medicare IM given:   Date Additional Medicare IM given:    Discharge Disposition:  HOME/SELF CARE  Per UR Regulation:  Reviewed for med. necessity/level of care/duration of stay  If discussed at [place] of Stay Meetings, dates discussed:    Comments:  04/01/14 [place], BSN [phone] patient for dc todlay home oxygen set up for patient with [facility], Jermaine brought to patient's room.  NCM set [facility] and [facility].  NCM informed patient  to go over to [facility] to get medications at a discounted price. NCM gave patient orange card paperwork also.

## 2014-04-11 ENCOUNTER — Encounter (HOSPITAL_COMMUNITY): Payer: Self-pay | Admitting: Emergency Medicine

## 2014-04-11 DIAGNOSIS — R51 Headache: Secondary | ICD-10-CM | POA: Insufficient documentation

## 2014-04-11 DIAGNOSIS — J441 Chronic obstructive pulmonary disease with (acute) exacerbation: Secondary | ICD-10-CM | POA: Insufficient documentation

## 2014-04-11 DIAGNOSIS — E669 Obesity, unspecified: Secondary | ICD-10-CM | POA: Insufficient documentation

## 2014-04-11 DIAGNOSIS — J45901 Unspecified asthma with (acute) exacerbation: Secondary | ICD-10-CM

## 2014-04-11 DIAGNOSIS — F17200 Nicotine dependence, unspecified, uncomplicated: Secondary | ICD-10-CM | POA: Insufficient documentation

## 2014-04-11 MED ORDER — ONDANSETRON 4 MG PO TBDP
8.0000 mg | ORAL_TABLET | Freq: Once | ORAL | Status: AC
  Administered 2014-04-11: 8 mg via ORAL
  Filled 2014-04-11: qty 2

## 2014-04-11 MED ORDER — OXYCODONE-ACETAMINOPHEN 5-325 MG PO TABS
1.0000 | ORAL_TABLET | Freq: Once | ORAL | Status: AC
  Administered 2014-04-11: 1 via ORAL
  Filled 2014-04-11: qty 1

## 2014-04-11 NOTE — ED Notes (Signed)
The pt has multiple complaints.  Headache since Thursday with blurred vision  Floaters  Face and jaw pain like a cramp in his face also. He has taken tylenol with no relief. .. He has  Asthma and copd and he is having some chest pain

## 2014-04-12 ENCOUNTER — Emergency Department (HOSPITAL_COMMUNITY)
Admission: EM | Admit: 2014-04-12 | Discharge: 2014-04-12 | Discharge status: Against Medical Advice | Payer: Self-pay | Attending: Emergency Medicine | Admitting: Emergency Medicine

## 2014-04-12 NOTE — ED Notes (Signed)
No answer in waiting area.

## 2014-04-12 NOTE — ED Notes (Signed)
No answer in waiting room 

## 2014-04-22 ENCOUNTER — Ambulatory Visit: Payer: No Typology Code available for payment source | Attending: Internal Medicine

## 2014-05-05 ENCOUNTER — Ambulatory Visit: Payer: Self-pay | Admitting: Family Medicine

## 2014-05-14 ENCOUNTER — Encounter (HOSPITAL_BASED_OUTPATIENT_CLINIC_OR_DEPARTMENT_OTHER): Payer: No Typology Code available for payment source

## 2014-05-18 ENCOUNTER — Encounter: Payer: Self-pay | Admitting: Family Medicine

## 2014-05-18 ENCOUNTER — Encounter (HOSPITAL_COMMUNITY): Payer: Self-pay | Admitting: Emergency Medicine

## 2014-05-18 ENCOUNTER — Ambulatory Visit (INDEPENDENT_AMBULATORY_CARE_PROVIDER_SITE_OTHER): Payer: No Typology Code available for payment source | Admitting: Family Medicine

## 2014-05-18 ENCOUNTER — Emergency Department (HOSPITAL_COMMUNITY)
Admission: EM | Admit: 2014-05-18 | Discharge: 2014-05-18 | Disposition: A | Discharge status: Home / Self Care | Payer: No Typology Code available for payment source | Source: Home / Self Care | Attending: Family Medicine | Admitting: Family Medicine

## 2014-05-18 ENCOUNTER — Ambulatory Visit: Payer: No Typology Code available for payment source | Attending: Internal Medicine | Admitting: Internal Medicine

## 2014-05-18 ENCOUNTER — Encounter: Payer: Self-pay | Admitting: Internal Medicine

## 2014-05-18 VITALS — BP 125/90 | HR 105 | Ht 73.0 in | Wt 330.0 lb

## 2014-05-18 VITALS — BP 130/88 | HR 116 | Temp 98.9°F | Resp 16 | Ht 73.0 in | Wt 338.5 lb

## 2014-05-18 DIAGNOSIS — M25571 Pain in right ankle and joints of right foot: Principal | ICD-10-CM

## 2014-05-18 DIAGNOSIS — F17200 Nicotine dependence, unspecified, uncomplicated: Secondary | ICD-10-CM | POA: Insufficient documentation

## 2014-05-18 DIAGNOSIS — J441 Chronic obstructive pulmonary disease with (acute) exacerbation: Secondary | ICD-10-CM | POA: Insufficient documentation

## 2014-05-18 DIAGNOSIS — M25579 Pain in unspecified ankle and joints of unspecified foot: Secondary | ICD-10-CM | POA: Insufficient documentation

## 2014-05-18 DIAGNOSIS — M25569 Pain in unspecified knee: Secondary | ICD-10-CM | POA: Insufficient documentation

## 2014-05-18 DIAGNOSIS — F4322 Adjustment disorder with anxiety: Secondary | ICD-10-CM | POA: Insufficient documentation

## 2014-05-18 DIAGNOSIS — G8929 Other chronic pain: Secondary | ICD-10-CM

## 2014-05-18 DIAGNOSIS — J449 Chronic obstructive pulmonary disease, unspecified: Secondary | ICD-10-CM

## 2014-05-18 DIAGNOSIS — M25562 Pain in left knee: Secondary | ICD-10-CM

## 2014-05-18 DIAGNOSIS — J4521 Mild intermittent asthma with (acute) exacerbation: Secondary | ICD-10-CM

## 2014-05-18 DIAGNOSIS — J45901 Unspecified asthma with (acute) exacerbation: Secondary | ICD-10-CM

## 2014-05-18 MED ORDER — FLUTICASONE-SALMETEROL 250-50 MCG/DOSE IN AEPB: 1.0000 | INHALATION_SPRAY | Freq: Two times a day (BID) | RESPIRATORY_TRACT | Status: DC

## 2014-05-18 MED ORDER — MELOXICAM 15 MG PO TABS: 15.0000 mg | ORAL_TABLET | Freq: Every day | ORAL | Status: DC

## 2014-05-18 MED ORDER — ALPRAZOLAM 0.25 MG PO TABS: 0.2500 mg | ORAL_TABLET | Freq: Every evening | ORAL | Status: DC | PRN

## 2014-05-18 MED ORDER — ALBUTEROL SULFATE HFA 108 (90 BASE) MCG/ACT IN AERS: 2.0000 | INHALATION_SPRAY | Freq: Four times a day (QID) | RESPIRATORY_TRACT | Status: DC | PRN

## 2014-05-18 MED ORDER — ACETAMINOPHEN-CODEINE #3 300-30 MG PO TABS: 1.0000 | ORAL_TABLET | ORAL | Status: DC | PRN

## 2014-05-18 MED ORDER — ALBUTEROL SULFATE HFA 108 (90 BASE) MCG/ACT IN AERS: 2.0000 | INHALATION_SPRAY | RESPIRATORY_TRACT | Status: DC | PRN

## 2014-05-18 NOTE — Patient Instructions (Signed)
Keep your appointment with your family physician. Unfortunately I don't think you were referred to the right clinic - You will either need to see an orthopedic surgeon again or a pain clinic. Icing 15 minutes at a time 3-4 times a day as needed. Meloxicam 15 mg daily with food for pain and inflammation.

## 2014-05-18 NOTE — Progress Notes (Signed)
Patient here to establish care.  Patient is hospital f/u for asthma and COPD. Patient states he is on 2L O2 at night.  Patient states he needs a refill on Xanax and would like to talk about options for pain.

## 2014-05-18 NOTE — ED Provider Notes (Signed)
CSN: [id number]     Arrival date & time 05/18/14  1229 History   First MD Initiated Contact with Patient 05/18/14 1254     Chief Complaint  Patient presents with  . Medication Refill  . Anxiety   (Consider location/radiation/quality/duration/timing/severity/associated sxs/prior Treatment) Patient is a 38 y.o. male presenting with shortness of breath. The history is provided by the patient.  Shortness of Breath Severity:  Moderate Onset quality:  Gradual Chronicity:  Chronic Context comment:  Recently hosp for copd , pt still smoking,also wants anxiety meds and for ankle pain (has pins and screws) old injury. Relieved by:  Inhaler Associated symptoms: wheezing   Associated symptoms: no cough     Past Medical History  Diagnosis Date  . Asthma   . Obesity   . COPD (chronic obstructive pulmonary disease)   . Emphysema   . Cigarette smoker    Past Surgical History  Procedure Laterality Date  . Ankle surgery     Family History  Problem Relation Age of Onset  . COPD Neg Hx    History  Substance Use Topics  . Smoking status: Current Every Day Smoker  . Smokeless tobacco: Never Used  . Alcohol Use: Yes    Review of Systems  Constitutional: Negative.   HENT: Negative.   Respiratory: Positive for shortness of breath and wheezing. Negative for cough.     Allergies  Review of patient's allergies indicates no known allergies.  Home Medications   Prior to Admission medications   Medication Sig Start Date End Date Taking? Authorizing Provider  acetaminophen (TYLENOL) 325 MG tablet Take 650 mg by mouth every 6 (six) hours as needed for mild pain.    Historical Provider, MD  albuterol (PROVENTIL HFA;VENTOLIN HFA) 108 (90 BASE) MCG/ACT inhaler Inhale 2 puffs into the lungs every 4 (four) hours as needed for wheezing or shortness of breath. 04/01/14   Shanker Kristeen Mans, MD  albuterol (PROVENTIL HFA;VENTOLIN HFA) 108 (90 BASE) MCG/ACT inhaler Inhale 2 puffs into the lungs every 6  (six) hours as needed for wheezing or shortness of breath. 05/18/14   Danny Fischer, MD  albuterol (PROVENTIL) (2.5 MG/3ML) 0.083% nebulizer solution Take 3 mLs (2.5 mg total) by nebulization every 4 (four) hours as needed for wheezing or shortness of breath. 04/01/14   Shanker Kristeen Mans, MD  ALPRAZolam Duanne Moron) 0.25 MG tablet Take 1 tablet (0.25 mg total) by mouth 3 (three) times daily as needed for anxiety. 04/01/14   Shanker Kristeen Mans, MD  Fluticasone-Salmeterol (ADVAIR DISKUS) 250-50 MCG/DOSE AEPB Inhale 1 puff into the lungs 2 (two) times daily. 04/01/14   Shanker Kristeen Mans, MD  Fluticasone-Salmeterol (ADVAIR) 250-50 MCG/DOSE AEPB Inhale 1 puff into the lungs 2 (two) times daily. 05/18/14   Danny Fischer, MD  HYDROcodone-acetaminophen (NORCO) 7.5-325 MG per tablet Take 1 tablet by mouth every 6 (six) hours as needed for moderate pain. 04/01/14   Shanker Kristeen Mans, MD   BP 139/90  Pulse 116  Temp(Src) 98.2 F (36.8 C) (Oral)  Resp 20  SpO2 94% Physical Exam  Nursing note and vitals reviewed. Constitutional: He is oriented to person, place, and time. He appears well-developed and well-nourished.  HENT:  Mouth/Throat: Oropharynx is clear and moist.  Neck: Normal range of motion. Neck supple.  Cardiovascular: Regular rhythm and normal heart sounds.   Pulmonary/Chest: Effort normal and breath sounds normal.  Neurological: He is alert and oriented to person, place, and time.  Skin: Skin is warm and dry.  ED Course  Procedures (including critical care time) Labs Review Labs Reviewed - No data to display  Imaging Review No results found.   MDM   1. COPD with asthma        Danny Fischer, MD 05/18/14 1323

## 2014-05-18 NOTE — Discharge Instructions (Signed)
Go to mental health center for anxiety issues, to your family doctor as planned.

## 2014-05-18 NOTE — Patient Instructions (Signed)
Chronic Pain Chronic pain can be defined as pain that is off and on and lasts for 3-6 months or longer. Many things cause chronic pain, which can make it difficult to make a diagnosis. There are many treatment options available for chronic pain. However, finding a treatment that works well for you may require trying various approaches until the right one is found. Many people benefit from a combination of two or more types of treatment to control their pain. SYMPTOMS  Chronic pain can occur anywhere in the body and can range from mild to very severe. Some types of chronic pain include:  Headache.  Low back pain.  Cancer pain.  Arthritis pain.  Neurogenic pain. This is pain resulting from damage to nerves. People with chronic pain may also have other symptoms such as:  Depression.  Anger.  Insomnia.  Anxiety. DIAGNOSIS  Your health care provider will help diagnose your condition over time. In many cases, the initial focus will be on excluding possible conditions that could be causing the pain. Depending on your symptoms, your health care provider may order tests to diagnose your condition. Some of these tests may include:   Blood tests.   CT scan.   MRI.   X-rays.   Ultrasounds.   Nerve conduction studies.  You may need to see a specialist.  TREATMENT  Finding treatment that works well may take time. You may be referred to a pain specialist. He or she may prescribe medicine or therapies, such as:   Mindful meditation or yoga.  Shots (injections) of numbing or pain-relieving medicines into the spine or area of pain.  Local electrical stimulation.  Acupuncture.   Massage therapy.   Aroma, color, light, or sound therapy.   Biofeedback.   Working with a physical therapist to keep from getting stiff.   Regular, gentle exercise.   Cognitive or behavioral therapy.   Group support.  Sometimes, surgery may be recommended.  HOME CARE INSTRUCTIONS    Take all medicines as directed by your health care provider.   Lessen stress in your life by relaxing and doing things such as listening to calming music.   Exercise or be active as directed by your health care provider.   Eat a healthy diet and include things such as vegetables, fruits, fish, and lean meats in your diet.   Keep all follow-up appointments with your health care provider.   Attend a support group with others suffering from chronic pain. SEEK MEDICAL CARE IF:   Your pain gets worse.   You develop a new pain that was not there before.   You cannot tolerate medicines given to you by your health care provider.   You have new symptoms since your last visit with your health care provider.  SEEK IMMEDIATE MEDICAL CARE IF:   You feel weak.   You have decreased sensation or numbness.   You lose control of bowel or bladder function.   Your pain suddenly gets much worse.   You develop shaking.  You develop chills.  You develop confusion.  You develop chest pain.  You develop shortness of breath.  MAKE SURE YOU:  Understand these instructions.  Will watch your condition.  Will get help right away if you are not doing well or get worse. Document Released: 06/23/2002 Document Revised: 06/03/2013 Document Reviewed: 03/27/2013 [facility] Patient Information 2015 [place], [place]. This information is not intended to replace advice given to you by your health care provider. Make sure you discuss any  questions you have with your health care provider. Adjustment Disorder Most changes in life can cause stress. Getting used to changes may take a few months or longer. If feelings of stress, hopelessness, or worry continue, you may have an adjustment disorder. This stress-related mental health problem may affect your feelings, thinking and how you act. It occurs in both sexes and happens at any age. SYMPTOMS  Some of the following problems may be seen and vary  from person to person:  Sadness or depression.  Loss of enjoyment.  Thoughts of suicide.  Fighting.  Avoiding family and friends.  Poor school performance.  Hopelessness, sense of loss.  Trouble sleeping.  Vandalism.  Worry, weight loss or gain.  Crying spells.  Anxiety  Reckless driving.  Skipping school.  Poor work Systems analyst.  Nervousness.  Ignoring bills.  Poor attitude. DIAGNOSIS  Your caregiver will ask what has happened in your life and do a physical exam. They will make a diagnosis of an adjustment disorder when they are sure another problem or medical illness causing your feelings does not exist. TREATMENT  When problems caused by stress interfere with you daily life or last longer than a few months, you may need counseling for an adjustment disorder. Early treatment may diminish problems and help you to better cope with the stressful events in your life. Sometimes medication is necessary. Individual counseling and or support groups can be very helpful. PROGNOSIS  Adjustment disorders usually last less than 3 to 6 months. The condition may persist if there is long lasting stress. This could include health problems, relationship problems, or job difficulties where you can not easily escape from what is causing the problem. PREVENTION  Even the most mentally healthy, highly functioning people can suffer from an adjustment disorder given a significant blow from a life-changing event. There is no way to prevent pain and loss. Most people need help from time to time. You are not alone. SEEK MEDICAL CARE IF:  Your feelings or symptoms listed above do not improve or worsen. Document Released: 06/05/2006 Document Revised: 12/24/2011 Document Reviewed: 08/27/2007 [facility] Patient Information 2015 [place], [place]. This information is not intended to replace advice given to you by your health care provider. Make sure you discuss any questions you have with your health  care provider.
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
## 2014-05-18 NOTE — ED Notes (Signed)
States he needs med refill on right ankle pain and asthma inhaler  States he is exp. Anxiety States he has 9 kids and his grandmother is passing away

## 2014-05-19 ENCOUNTER — Ambulatory Visit: Payer: No Typology Code available for payment source | Admitting: Internal Medicine

## 2014-05-20 ENCOUNTER — Encounter: Payer: Self-pay | Admitting: Family Medicine

## 2014-05-20 DIAGNOSIS — M25571 Pain in right ankle and joints of right foot: Principal | ICD-10-CM

## 2014-05-20 DIAGNOSIS — G8929 Other chronic pain: Secondary | ICD-10-CM | POA: Insufficient documentation
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
## 2014-05-20 NOTE — Assessment & Plan Note (Signed)
s/p ORIF.  Advised patient I believe he was referred to the wrong clinic as I'm not an orthopedic surgeon.  He would either need to see a surgeon or a pain clinic if surgeon felt nothing further could be done for this.  We do not fill percocet for chronic pain.  Icing, meloxicam.  F/u prn.

## 2014-05-26 ENCOUNTER — Ambulatory Visit: Payer: No Typology Code available for payment source | Admitting: Internal Medicine

## 2014-06-13 ENCOUNTER — Ambulatory Visit (HOSPITAL_BASED_OUTPATIENT_CLINIC_OR_DEPARTMENT_OTHER): Payer: No Typology Code available for payment source | Attending: Internal Medicine

## 2014-07-04 IMAGING — CR DG CHEST 2V
2 series · 2 of 2 positions shown · non-contrast
Comparison: 09/02/2013.

CLINICAL DATA: Short of breath.  Asthma.  Emphysema.

EXAM:
CHEST  2 VIEW

[w chest pa]
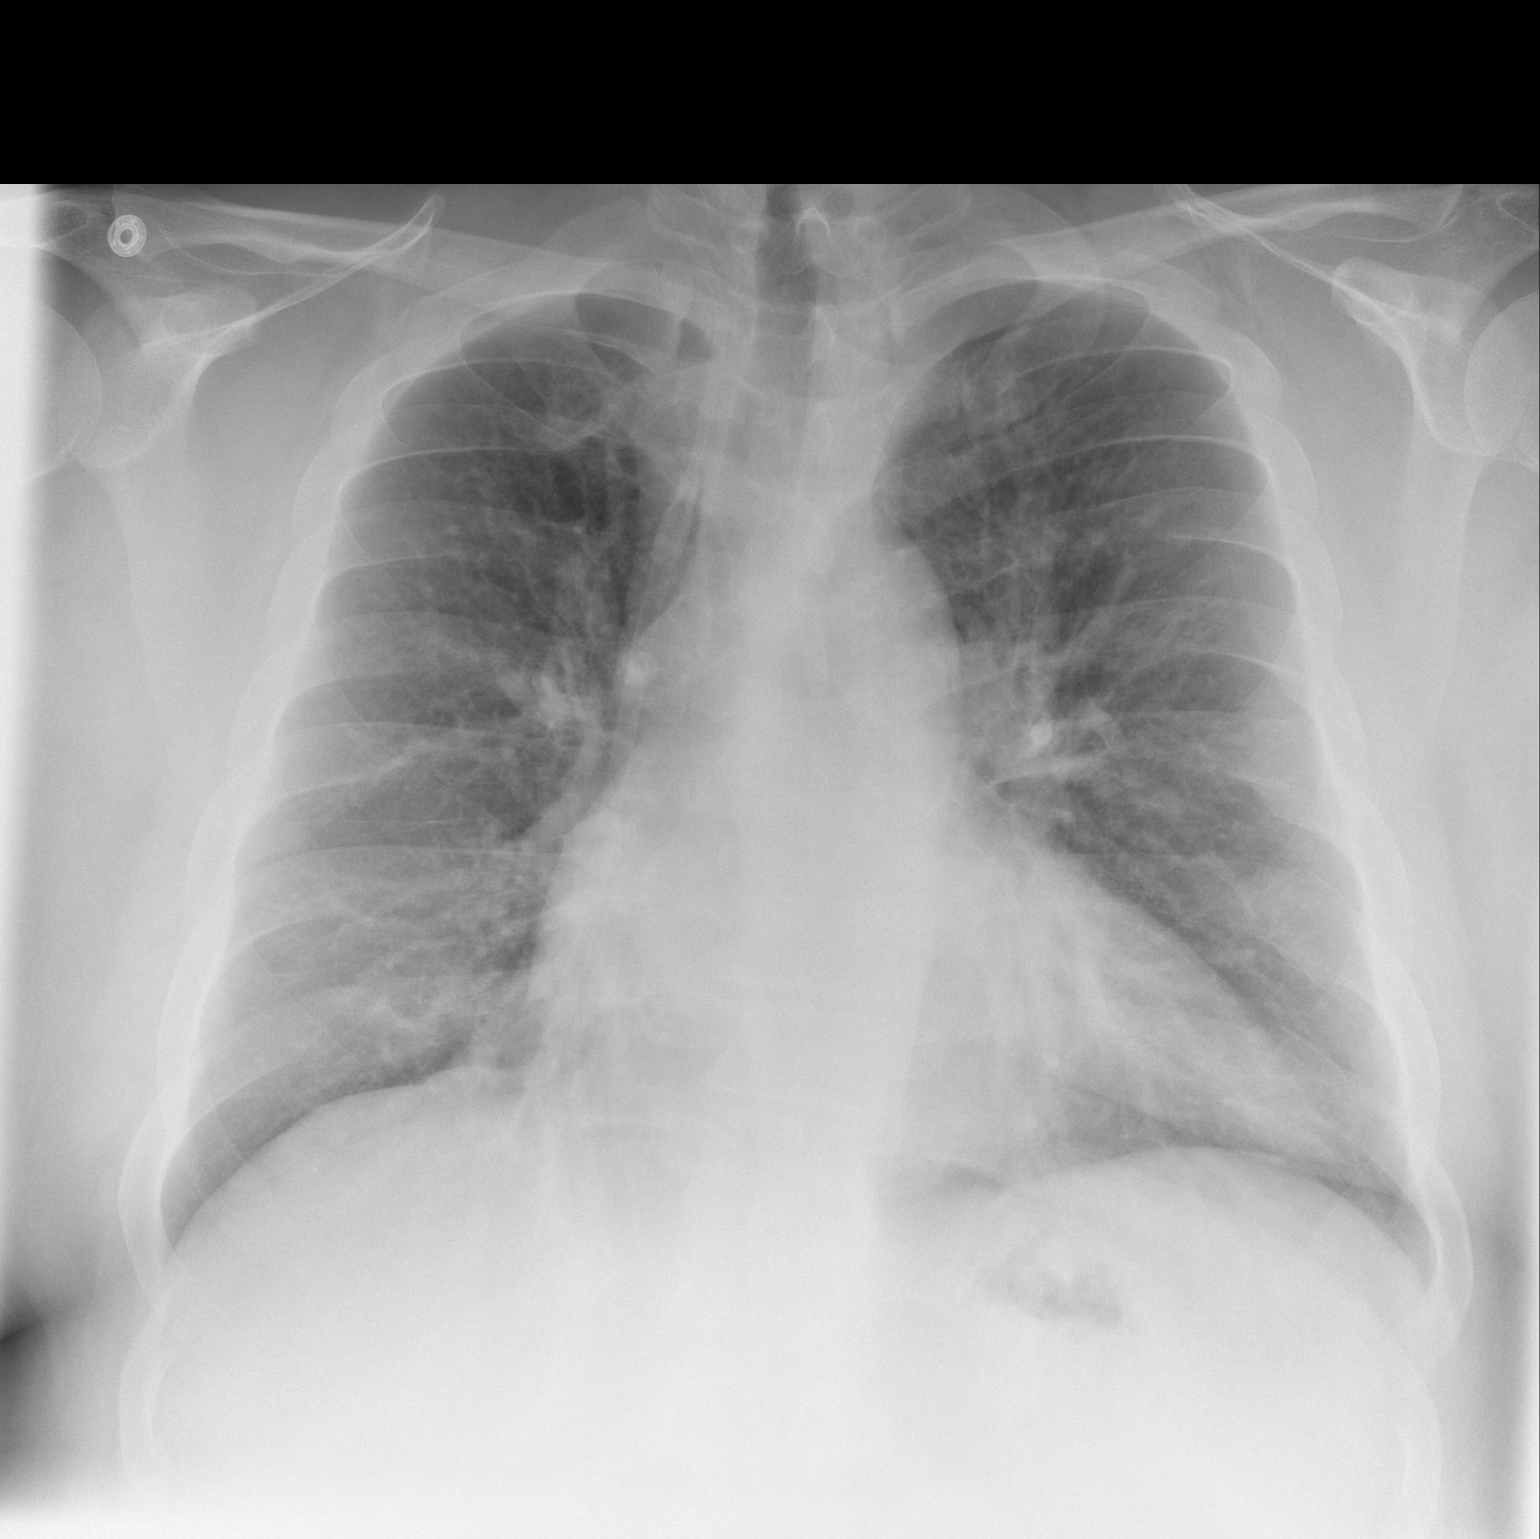

[w chest lat]
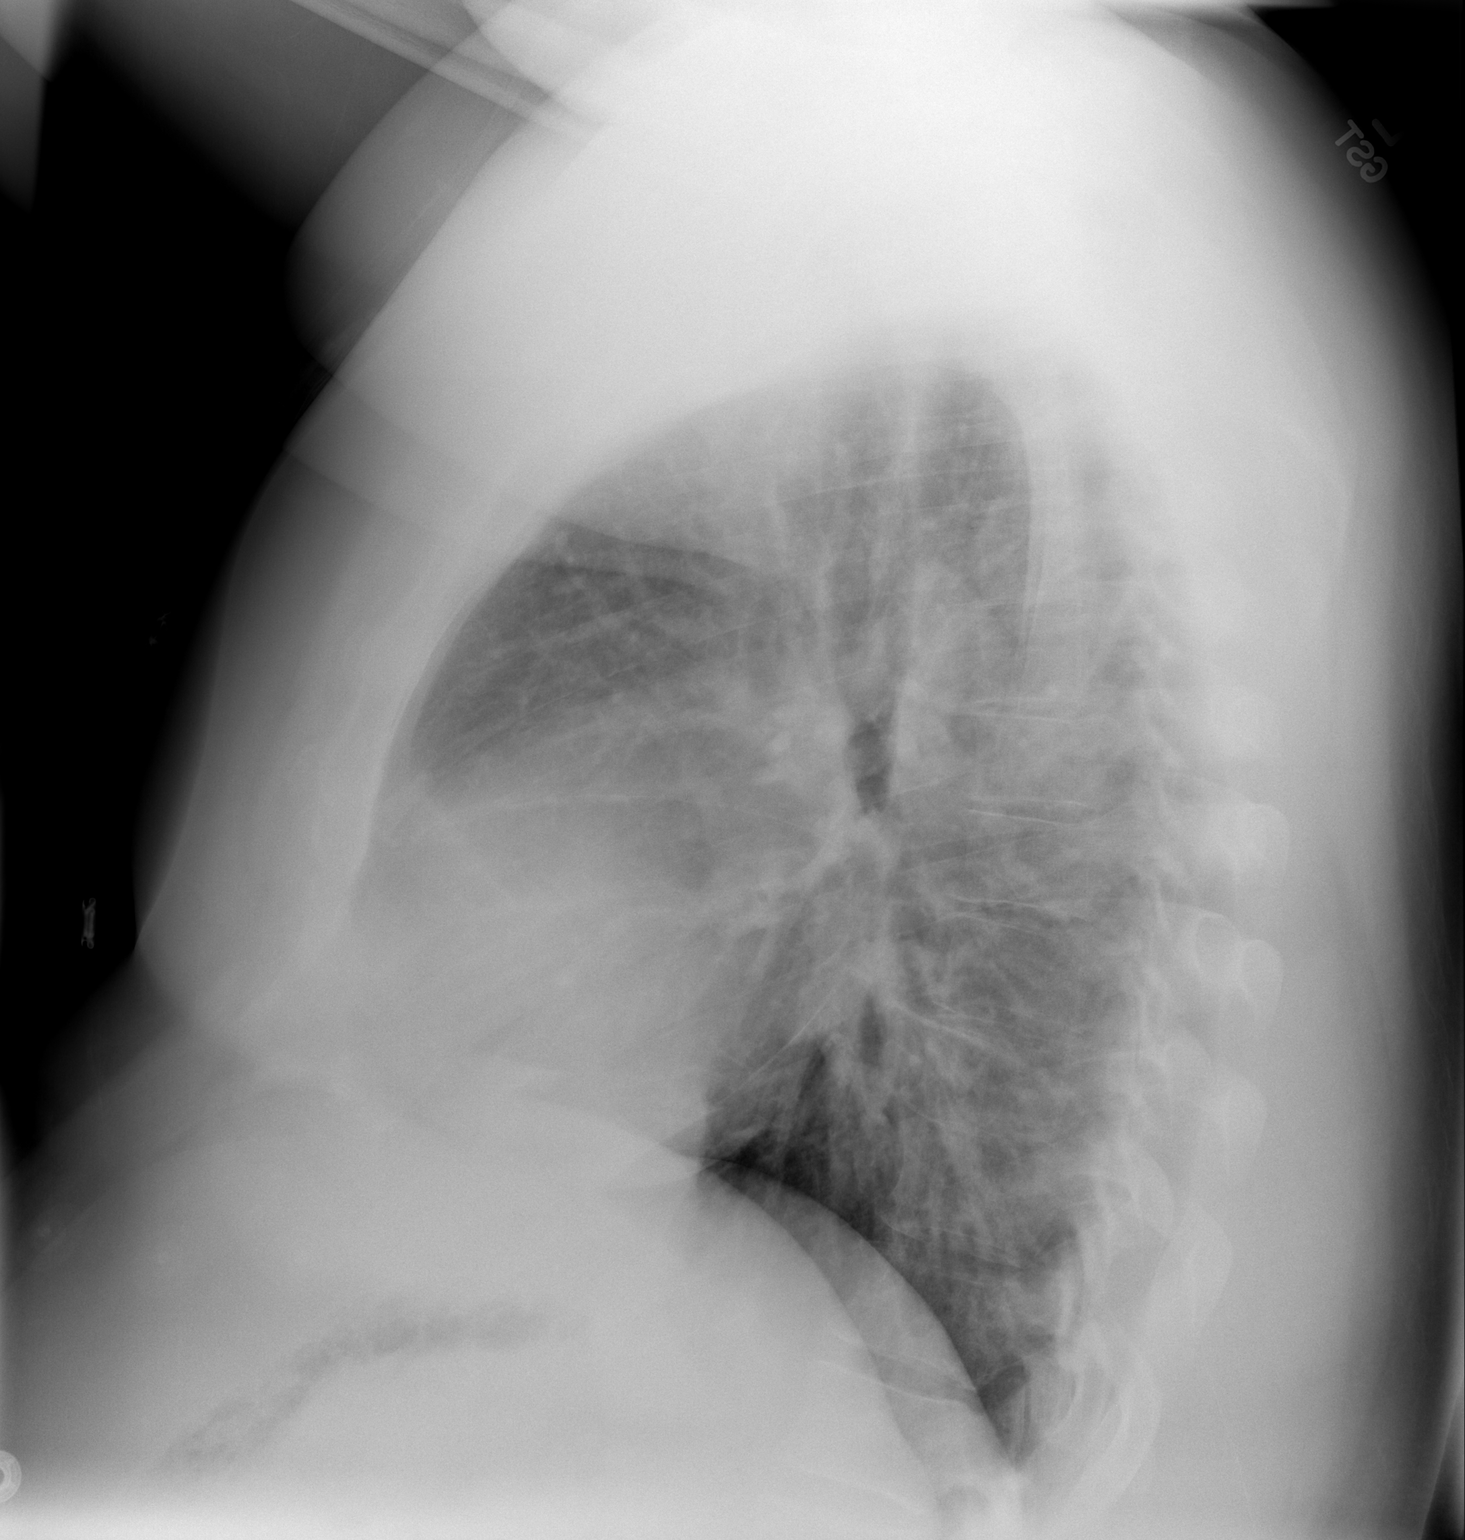

[2 of 2 positions shown; findings below may reference images not displayed]

FINDINGS: The cardiopericardial silhouette is enlarged. Pulmonary vascular
congestion is present. No effusion or airspace disease. Mediastinal
contours appear within normal limits.
IMPRESSION: Cardiomegaly and pulmonary vascular congestion suggesting mild CHF.

## 2014-08-09 ENCOUNTER — Emergency Department (HOSPITAL_COMMUNITY)
Admission: EM | Admit: 2014-08-09 | Discharge: 2014-08-10 | Disposition: A | Discharge status: Home / Self Care | Payer: No Typology Code available for payment source | Attending: Emergency Medicine | Admitting: Emergency Medicine

## 2014-08-09 ENCOUNTER — Other Ambulatory Visit: Payer: Self-pay

## 2014-08-09 ENCOUNTER — Encounter (HOSPITAL_COMMUNITY): Payer: Self-pay | Admitting: Emergency Medicine

## 2014-08-09 ENCOUNTER — Emergency Department (HOSPITAL_COMMUNITY): Payer: No Typology Code available for payment source

## 2014-08-09 DIAGNOSIS — R05 Cough: Secondary | ICD-10-CM

## 2014-08-09 DIAGNOSIS — J449 Chronic obstructive pulmonary disease, unspecified: Secondary | ICD-10-CM | POA: Insufficient documentation

## 2014-08-09 DIAGNOSIS — R059 Cough, unspecified: Secondary | ICD-10-CM

## 2014-08-09 DIAGNOSIS — R197 Diarrhea, unspecified: Secondary | ICD-10-CM

## 2014-08-09 DIAGNOSIS — Z720 Tobacco use: Secondary | ICD-10-CM | POA: Insufficient documentation

## 2014-08-09 DIAGNOSIS — R112 Nausea with vomiting, unspecified: Secondary | ICD-10-CM

## 2014-08-09 DIAGNOSIS — Z79899 Other long term (current) drug therapy: Secondary | ICD-10-CM | POA: Insufficient documentation

## 2014-08-09 DIAGNOSIS — E669 Obesity, unspecified: Secondary | ICD-10-CM | POA: Insufficient documentation

## 2014-08-09 DIAGNOSIS — B349 Viral infection, unspecified: Secondary | ICD-10-CM | POA: Insufficient documentation

## 2014-08-09 DIAGNOSIS — Z7951 Long term (current) use of inhaled steroids: Secondary | ICD-10-CM | POA: Insufficient documentation

## 2014-08-09 DIAGNOSIS — M791 Myalgia, unspecified site: Secondary | ICD-10-CM

## 2014-08-09 LAB — CBC
HCT: 42.4 % (ref 39.0–52.0)
HEMOGLOBIN: 13.9 g/dL (ref 13.0–17.0)
MCH: 28.4 pg (ref 26.0–34.0)
MCHC: 32.8 g/dL (ref 30.0–36.0)
MCV: 86.7 fL (ref 78.0–100.0)
Platelets: 234 10*3/uL (ref 150–400)
RBC: 4.89 MIL/uL (ref 4.22–5.81)
RDW: 13.1 % (ref 11.5–15.5)
WBC: 7.3 10*3/uL (ref 4.0–10.5)

## 2014-08-09 LAB — BASIC METABOLIC PANEL
ANION GAP: 12 (ref 5–15)
BUN: 15 mg/dL (ref 6–23)
CHLORIDE: 103 meq/L (ref 96–112)
CO2: 27 meq/L (ref 19–32)
Calcium: 9.6 mg/dL (ref 8.4–10.5)
Creatinine, Ser: 1.04 mg/dL (ref 0.50–1.35)
GFR calc Af Amer: >90 mL/min (ref >90)
GFR calc non Af Amer: >90 mL/min (ref >90)
Glucose, Bld: 104 mg/dL — ABNORMAL HIGH (ref 70–99)
POTASSIUM: 4 meq/L (ref 3.7–5.3)
Sodium: 142 mEq/L (ref 137–147)

## 2014-08-09 LAB — RAPID STREP SCREEN (MED CTR MEBANE ONLY): Streptococcus, Group A Screen (Direct): NEGATIVE

## 2014-08-09 MED ORDER — ALBUTEROL SULFATE (2.5 MG/3ML) 0.083% IN NEBU
5.0000 mg | INHALATION_SOLUTION | Freq: Once | RESPIRATORY_TRACT | Status: AC
  Administered 2014-08-09: 5 mg via RESPIRATORY_TRACT
  Filled 2014-08-09: qty 6

## 2014-08-09 MED ORDER — SODIUM CHLORIDE 0.9 % IV SOLN
1000.0000 mL | Freq: Once | INTRAVENOUS | Status: AC
  Administered 2014-08-09: 1000 mL via INTRAVENOUS

## 2014-08-09 MED ORDER — MORPHINE SULFATE 4 MG/ML IJ SOLN
4.0000 mg | Freq: Once | INTRAMUSCULAR | Status: AC
  Administered 2014-08-09: 4 mg via INTRAVENOUS
  Filled 2014-08-09: qty 1

## 2014-08-09 MED ORDER — LOPERAMIDE HCL 2 MG PO CAPS
4.0000 mg | ORAL_CAPSULE | Freq: Once | ORAL | Status: AC
  Administered 2014-08-09: 4 mg via ORAL
  Filled 2014-08-09: qty 2

## 2014-08-09 MED ORDER — ONDANSETRON HCL 4 MG/2ML IJ SOLN
4.0000 mg | Freq: Once | INTRAMUSCULAR | Status: AC
  Administered 2014-08-09: 4 mg via INTRAVENOUS
  Filled 2014-08-09: qty 2

## 2014-08-09 MED ORDER — METHYLPREDNISOLONE SODIUM SUCC 125 MG IJ SOLR
125.0000 mg | Freq: Once | INTRAMUSCULAR | Status: AC
  Administered 2014-08-09: 125 mg via INTRAVENOUS
  Filled 2014-08-09: qty 2

## 2014-08-09 MED ORDER — SODIUM CHLORIDE 0.9 % IV SOLN
1000.0000 mL | INTRAVENOUS | Status: DC
  Administered 2014-08-09: 1000 mL via INTRAVENOUS

## 2014-08-09 MED ORDER — IPRATROPIUM-ALBUTEROL 0.5-2.5 (3) MG/3ML IN SOLN
3.0000 mL | Freq: Once | RESPIRATORY_TRACT | Status: AC
  Administered 2014-08-09: 3 mL via RESPIRATORY_TRACT
  Filled 2014-08-09: qty 3

## 2014-08-09 NOTE — ED Provider Notes (Signed)
CSN: [id number]     Arrival date & time 08/09/14  1438 History   First MD Initiated Contact with Patient 08/09/14 2223     Chief Complaint  Patient presents with  . URI  . Diarrhea     (Consider location/radiation/quality/duration/timing/severity/associated sxs/prior Treatment) Patient is a 38 y.o. male presenting with URI and diarrhea. The history is provided by the patient.  URI Diarrhea Associated symptoms: URI   He has been sick for the last 2 days. He has had a sore throat, productive of some yellowish sputum, generalized body aches, nausea, diarrhea. He denies fever but has had chills and sweats. He rates his pain at 8/10. He denies any sick contacts. He denies having had influenza immunization this season. Bupropion with Rise Paganini. He has been using albuterol which has not been giving him relief.  Past Medical History  Diagnosis Date  . Asthma   . Obesity   . COPD (chronic obstructive pulmonary disease)   . Emphysema   . Cigarette smoker    Past Surgical History  Procedure Laterality Date  . Ankle surgery     Family History  Problem Relation Age of Onset  . COPD Neg Hx   . Birth defects Sister    History  Substance Use Topics  . Smoking status: Current Every Day Smoker -- 0.25 packs/day    Types: Cigarettes  . Smokeless tobacco: Never Used  . Alcohol Use: Yes     Comment: occ    Review of Systems  Gastrointestinal: Positive for diarrhea.  All other systems reviewed and are negative.     Allergies  Review of patient's allergies indicates no known allergies.  Home Medications   Prior to Admission medications   Medication Sig Start Date End Date Taking? Authorizing Provider  acetaminophen (TYLENOL) 325 MG tablet Take 325-650 mg by mouth every 4 (four) hours as needed for mild pain, moderate pain or headache. Patient states he has taken at least 10-15 tylenol tablets today with no pain relief   Yes Historical Provider, MD  albuterol (PROVENTIL HFA;VENTOLIN  HFA) 108 (90 BASE) MCG/ACT inhaler Inhale 2 puffs into the lungs every 4 (four) hours as needed for wheezing or shortness of breath. 05/18/14  Yes Tresa Garter, MD  Fluticasone-Salmeterol (ADVAIR) 250-50 MCG/DOSE AEPB Inhale 1 puff into the lungs 2 (two) times daily. 05/18/14  Yes Billy Fischer, MD   BP 132/71  Pulse 102  Temp(Src) 98.2 F (36.8 C) (Oral)  Resp 17  Ht 6\' 1"  (1.854 m)  Wt 300 lb (136.079 kg)  BMI 39.59 kg/m2  SpO2 98% Physical Exam  Nursing note and vitals reviewed.  Morbidly obese 38 year old male, resting comfortably and in no acute distress. Vital signs are significant for borderline tachycardia. Oxygen saturation is 98%, which is normal. Head is normocephalic and atraumatic. PERRLA, EOMI. Oropharynx shows moderate erythema with tonsillar hypertrophy and mild edema of the uvula. There is no exudate present. Phonation is normal and he has no difficulty with secretions.. Neck is nontender and supple without adenopathy or JVD. Back is nontender and there is no CVA tenderness. Lungs has some scattered wheezes with a prolonged exhalation phase. No rales or rhonchi. Chest is nontender. Heart has regular rate and rhythm without murmur. Abdomen is soft, flat, nontender without masses or hepatosplenomegaly and peristalsis is normoactive. Extremities have no cyanosis or edema, full range of motion is present. Skin is warm and dry without rash. Neurologic: Mental status is normal, cranial nerves are intact, there  are no motor or sensory deficits.  ED Course  Procedures (including critical care time) Labs Review Results for orders placed during the hospital encounter of 08/09/14  RAPID STREP SCREEN      Result Value Ref Range   Streptococcus, Group A Screen (Direct) NEGATIVE  NEGATIVE  BASIC METABOLIC PANEL      Result Value Ref Range   Sodium 142  137 - 147 mEq/L   Potassium 4.0  3.7 - 5.3 mEq/L   Chloride 103  96 - 112 mEq/L   CO2 27  19 - 32 mEq/L   Glucose, Bld  104 (*) 70 - 99 mg/dL   BUN 15  6 - 23 mg/dL   Creatinine, Ser 1.04  0.50 - 1.35 mg/dL   Calcium 9.6  8.4 - 10.5 mg/dL   GFR calc non Af Amer >90  >90 mL/min   GFR calc Af Amer >90  >90 mL/min   Anion gap 12  5 - 15  CBC      Result Value Ref Range   WBC 7.3  4.0 - 10.5 K/uL   RBC 4.89  4.22 - 5.81 MIL/uL   Hemoglobin 13.9  13.0 - 17.0 g/dL   HCT 42.4  39.0 - 52.0 %   MCV 86.7  78.0 - 100.0 fL   MCH 28.4  26.0 - 34.0 pg   MCHC 32.8  30.0 - 36.0 g/dL   RDW 13.1  11.5 - 15.5 %   Platelets 234  150 - 400 K/uL   Imaging Review Dg Chest 2 View (if Patient Has Fever And/or Copd)  08/09/2014   CLINICAL DATA:  Shortness of breath.  Initial encounter  EXAM: CHEST  2 VIEW  COMPARISON:  03/28/2014  FINDINGS: Borderline cardiomegaly, less convincing than on the previous study. Unchanged aortic and hilar contours. There is interstitial coarsening which is chronic and likely bronchitic. No effusion or pneumothorax. No evidence for pneumonia. Remote left-sided rib fractures.  IMPRESSION: Stable exam.  No active cardiopulmonary disease.   Electronically Signed   By: Jorje Guild M.D.   On: 08/09/2014 17:43   MDM   Final diagnoses:  Viral illness  Nausea vomiting and diarrhea  Cough  Myalgia    Respiratory tract infection with evidence of bronchitis and pharyngitis. Also GI component with nausea and diarrhea. I suspect that this is a viral illness but strep screen will be sent. Chest x-ray shows no evidence of pneumonia. He does show evidence of bronchospasm and will be given an albuterol with ipratropium nebulizer treatment and he will be given IV fluids as well as steroids.   He feels much better after above noted treatment although he did require a second dose of morphine for pain. He is discharged with prescriptions for tramadol, metoclopramide, prednisone, chlorpheniramine-hydrocodone. He is also given an albuterol inhaler to take home since his one at home has run out. He is encouraged to  get a flu immunization once he is over this current illness.  Delora Fuel, MD 16/10/96 0454

## 2014-08-09 NOTE — ED Notes (Addendum)
Pt states cough, sore throat, nausea and sob since Sat.  Pt has been giving himself breathing tx since Sat with minimal relief.  Wheezes noted on ascultation.  Pt also c/o diarrhea since sat.

## 2014-08-10 MED ORDER — PREDNISONE 20 MG PO TABS: 60.0000 mg | ORAL_TABLET | Freq: Every day | ORAL | Status: DC

## 2014-08-10 MED ORDER — HYDROCOD POLST-CHLORPHEN POLST 10-8 MG/5ML PO LQCR: 5.0000 mL | Freq: Two times a day (BID) | ORAL | Status: DC | PRN

## 2014-08-10 MED ORDER — METOCLOPRAMIDE HCL 10 MG PO TABS: 10.0000 mg | ORAL_TABLET | Freq: Four times a day (QID) | ORAL | Status: DC | PRN

## 2014-08-10 MED ORDER — TRAMADOL HCL 50 MG PO TABS: 50.0000 mg | ORAL_TABLET | Freq: Four times a day (QID) | ORAL | Status: DC | PRN

## 2014-08-10 MED ORDER — ALBUTEROL SULFATE HFA 108 (90 BASE) MCG/ACT IN AERS
2.0000 | INHALATION_SPRAY | RESPIRATORY_TRACT | Status: DC | PRN
Start: 2014-08-10 — End: 2014-08-10
  Administered 2014-08-10: 2 via RESPIRATORY_TRACT
  Filled 2014-08-10: qty 6.7

## 2014-08-10 NOTE — ED Notes (Signed)
Pt is going to get his father to drive him home.

## 2014-08-10 NOTE — Discharge Instructions (Signed)
Take loperamide (Imodium AD) as needed for diarrhea. Make sure to get your flu shot once you are feeling better.  Viral Infections A virus is a type of germ. Viruses can cause:  Minor sore throats.  Aches and pains.  Headaches.  Runny nose.  Rashes.  Watery eyes.  Tiredness.  Coughs.  Loss of appetite.  Feeling sick to your stomach (nausea).  Throwing up (vomiting).  Watery poop (diarrhea). HOME CARE   Only take medicines as told by your doctor.  Drink enough water and fluids to keep your pee (urine) clear or pale yellow. Sports drinks are a good choice.  Get plenty of rest and eat healthy. Soups and broths with crackers or rice are fine. GET HELP RIGHT AWAY IF:   You have a very bad headache.  You have shortness of breath.  You have chest pain or neck pain.  You have an unusual rash.  You cannot stop throwing up.  You have watery poop that does not stop.  You cannot keep fluids down.  You or your child has a temperature by mouth above 102 F (38.9 C), not controlled by medicine.  Your baby is older than 3 months with a rectal temperature of 102 F (38.9 C) or higher.  Your baby is 78 months old or younger with a rectal temperature of 100.4 F (38 C) or higher. MAKE SURE YOU:   Understand these instructions.  Will watch this condition.  Will get help right away if you are not doing well or get worse. Document Released: 09/13/2008 Document Revised: 12/24/2011 Document Reviewed: 02/06/2011 [facility] Patient Information 2015 [place], [place]. This information is not intended to replace advice given to you by your health care provider. Make sure you discuss any questions you have with your health care provider.  Cough, Adult  A cough is a reflex that helps clear your throat and airways. It can help heal the body or may be a reaction to an irritated airway. A cough may only last 2 or 3 weeks (acute) or may last more than 8 weeks (chronic).  CAUSES Acute  cough:  Viral or bacterial infections. Chronic cough:  Infections.  Allergies.  Asthma.  Post-nasal drip.  Smoking.  Heartburn or acid reflux.  Some medicines.  Chronic lung problems (COPD).  Cancer. SYMPTOMS   Cough.  Fever.  Chest pain.  Increased breathing rate.  High-pitched whistling sound when breathing (wheezing).  Colored mucus that you cough up (sputum). TREATMENT   A bacterial cough may be treated with antibiotic medicine.  A viral cough must run its course and will not respond to antibiotics.  Your caregiver may recommend other treatments if you have a chronic cough. HOME CARE INSTRUCTIONS   Only take over-the-counter or prescription medicines for pain, discomfort, or fever as directed by your caregiver. Use cough suppressants only as directed by your caregiver.  Use a cold steam vaporizer or humidifier in your bedroom or home to help loosen secretions.  Sleep in a semi-upright position if your cough is worse at night.  Rest as needed.  Stop smoking if you smoke. SEEK IMMEDIATE MEDICAL CARE IF:   You have pus in your sputum.  Your cough starts to worsen.  You cannot control your cough with suppressants and are losing sleep.  You begin coughing up blood.  You have difficulty breathing.  You develop pain which is getting worse or is uncontrolled with medicine.  You have a fever. MAKE SURE YOU:   Understand these instructions.  Will watch your condition.  Will get help right away if you are not doing well or get worse. Document Released: 03/30/2011 Document Revised: 12/24/2011 Document Reviewed: 03/30/2011 [facility] Patient Information 2015 [place], [place]. This information is not intended to replace advice given to you by your health care provider. Make sure you discuss any questions you have with your health care provider.  Nausea and Vomiting Nausea is a sick feeling that often comes before throwing up (vomiting). Vomiting is a  reflex where stomach contents come out of your mouth. Vomiting can cause severe loss of body fluids (dehydration). Children and elderly adults can become dehydrated quickly, especially if they also have diarrhea. Nausea and vomiting are symptoms of a condition or disease. It is important to find the cause of your symptoms. CAUSES   Direct irritation of the stomach lining. This irritation can result from increased acid production (gastroesophageal reflux disease), infection, food poisoning, taking certain medicines (such as nonsteroidal anti-inflammatory drugs), alcohol use, or tobacco use.  Signals from the brain.These signals could be caused by a headache, heat exposure, an inner ear disturbance, increased pressure in the brain from injury, infection, a tumor, or a concussion, pain, emotional stimulus, or metabolic problems.  An obstruction in the gastrointestinal tract (bowel obstruction).  Illnesses such as diabetes, hepatitis, gallbladder problems, appendicitis, kidney problems, cancer, sepsis, atypical symptoms of a heart attack, or eating disorders.  Medical treatments such as chemotherapy and radiation.  Receiving medicine that makes you sleep (general anesthetic) during surgery. DIAGNOSIS Your caregiver may ask for tests to be done if the problems do not improve after a few days. Tests may also be done if symptoms are severe or if the reason for the nausea and vomiting is not clear. Tests may include:  Urine tests.  Blood tests.  Stool tests.  Cultures (to look for evidence of infection).  X-rays or other imaging studies. Test results can help your caregiver make decisions about treatment or the need for additional tests. TREATMENT You need to stay well hydrated. Drink frequently but in small amounts.You may wish to drink water, sports drinks, clear broth, or eat frozen ice pops or gelatin dessert to help stay hydrated.When you eat, eating slowly may help prevent nausea.There  are also some antinausea medicines that may help prevent nausea. HOME CARE INSTRUCTIONS   Take all medicine as directed by your caregiver.  If you do not have an appetite, do not force yourself to eat. However, you must continue to drink fluids.  If you have an appetite, eat a normal diet unless your caregiver tells you differently.  Eat a variety of complex carbohydrates (rice, wheat, potatoes, bread), lean meats, yogurt, fruits, and vegetables.  Avoid high-fat foods because they are more difficult to digest.  Drink enough water and fluids to keep your urine clear or pale yellow.  If you are dehydrated, ask your caregiver for specific rehydration instructions. Signs of dehydration may include:  Severe thirst.  Dry lips and mouth.  Dizziness.  Dark urine.  Decreasing urine frequency and amount.  Confusion.  Rapid breathing or pulse. SEEK IMMEDIATE MEDICAL CARE IF:   You have blood or brown flecks (like coffee grounds) in your vomit.  You have black or bloody stools.  You have a severe headache or stiff neck.  You are confused.  You have severe abdominal pain.  You have chest pain or trouble breathing.  You do not urinate at least once every 8 hours.  You develop cold or clammy skin.  You continue to vomit for longer than 24 to 48 hours.  You have a fever. MAKE SURE YOU:   Understand these instructions.  Will watch your condition.  Will get help right away if you are not doing well or get worse. Document Released: 10/01/2005 Document Revised: 12/24/2011 Document Reviewed: 02/28/2011 [facility] Patient Information 2015 [place], [place]. This information is not intended to replace advice given to you by your health care provider. Make sure you discuss any questions you have with your health care provider.  Diarrhea Diarrhea is frequent loose and watery bowel movements. It can cause you to feel weak and dehydrated. Dehydration can cause you to become tired and  thirsty, have a dry mouth, and have decreased urination that often is dark yellow. Diarrhea is a sign of another problem, most often an infection that will not last long. In most cases, diarrhea typically lasts 2-3 days. However, it can last longer if it is a sign of something more serious. It is important to treat your diarrhea as directed by your caregiver to lessen or prevent future episodes of diarrhea. CAUSES  Some common causes include:  Gastrointestinal infections caused by viruses, bacteria, or parasites.  Food poisoning or food allergies.  Certain medicines, such as antibiotics, chemotherapy, and laxatives.  Artificial sweeteners and fructose.  Digestive disorders. HOME CARE INSTRUCTIONS  Ensure adequate fluid intake (hydration): Have 1 cup (8 oz) of fluid for each diarrhea episode. Avoid fluids that contain simple sugars or sports drinks, fruit juices, whole milk products, and sodas. Your urine should be clear or pale yellow if you are drinking enough fluids. Hydrate with an oral rehydration solution that you can purchase at pharmacies, retail stores, and online. You can prepare an oral rehydration solution at home by mixing the following ingredients together:   - tsp table salt.   tsp baking soda.   tsp salt substitute containing potassium chloride.  1  tablespoons sugar.  1 L (34 oz) of water.  Certain foods and beverages may increase the speed at which food moves through the gastrointestinal (GI) tract. These foods and beverages should be avoided and include:  Caffeinated and alcoholic beverages.  High-fiber foods, such as raw fruits and vegetables, nuts, seeds, and whole grain breads and cereals.  Foods and beverages sweetened with sugar alcohols, such as xylitol, sorbitol, and mannitol.  Some foods may be well tolerated and may help thicken stool including:  Starchy foods, such as rice, toast, pasta, low-sugar cereal, oatmeal, grits, baked potatoes, crackers, and  bagels.  Bananas.  Applesauce.  Add probiotic-rich foods to help increase healthy bacteria in the GI tract, such as yogurt and fermented milk products.  Wash your hands well after each diarrhea episode.  Only take over-the-counter or prescription medicines as directed by your caregiver.  Take a warm bath to relieve any burning or pain from frequent diarrhea episodes. SEEK IMMEDIATE MEDICAL CARE IF:   You are unable to keep fluids down.  You have persistent vomiting.  You have blood in your stool, or your stools are black and tarry.  You do not urinate in 6-8 hours, or there is only a small amount of very dark urine.  You have abdominal pain that increases or localizes.  You have weakness, dizziness, confusion, or light-headedness.  You have a severe headache.  Your diarrhea gets worse or does not get better.  You have a fever or persistent symptoms for more than 2-3 days.  You have a fever and your symptoms suddenly get  worse. MAKE SURE YOU:   Understand these instructions.  Will watch your condition.  Will get help right away if you are not doing well or get worse. Document Released: 09/21/2002 Document Revised: 02/15/2014 Document Reviewed: 06/08/2012 [facility] Patient Information 2015 [place], [place]. This information is not intended to replace advice given to you by your health care provider. Make sure you discuss any questions you have with your health care provider.  Prednisone tablets What is this medicine? PREDNISONE (PRED ni sone) is a corticosteroid. It is commonly used to treat inflammation of the skin, joints, lungs, and other organs. Common conditions treated include asthma, allergies, and arthritis. It is also used for other conditions, such as blood disorders and diseases of the adrenal glands. This medicine may be used for other purposes; ask your health care provider or pharmacist if you have questions. COMMON BRAND NAME(S): Deltasone, Predone,  Sterapred, Sterapred DS What should I tell my health care provider before I take this medicine? They need to know if you have any of these conditions: -Cushing's syndrome -diabetes -glaucoma -heart disease -high blood pressure -infection (especially a virus infection such as chickenpox, cold sores, or herpes) -kidney disease -liver disease -mental illness -myasthenia gravis -osteoporosis -seizures -stomach or intestine problems -thyroid disease -an unusual or allergic reaction to lactose, prednisone, other medicines, foods, dyes, or preservatives -pregnant or trying to get pregnant -breast-feeding How should I use this medicine? Take this medicine by mouth with a glass of water. Follow the directions on the prescription label. Take this medicine with food. If you are taking this medicine once a day, take it in the morning. Do not take more medicine than you are told to take. Do not suddenly stop taking your medicine because you may develop a severe reaction. Your doctor will tell you how much medicine to take. If your doctor wants you to stop the medicine, the dose may be slowly lowered over time to avoid any side effects. Talk to your pediatrician regarding the use of this medicine in children. Special care may be needed. Overdosage: If you think you have taken too much of this medicine contact a poison control center or emergency room at once. NOTE: This medicine is only for you. Do not share this medicine with others. What if I miss a dose? If you miss a dose, take it as soon as you can. If it is almost time for your next dose, talk to your doctor or health care professional. You may need to miss a dose or take an extra dose. Do not take double or extra doses without advice. What may interact with this medicine? Do not take this medicine with any of the following medications: -metyrapone -mifepristone This medicine may also interact with the following  medications: -aminoglutethimide -amphotericin B -aspirin and aspirin-like medicines -barbiturates -certain medicines for diabetes, like glipizide or glyburide -cholestyramine -cholinesterase inhibitors -cyclosporine -digoxin -diuretics -ephedrine -male hormones, like estrogens and birth control pills -isoniazid -ketoconazole -NSAIDS, medicines for pain and inflammation, like ibuprofen or naproxen -phenytoin -rifampin -toxoids -vaccines -warfarin This list may not describe all possible interactions. Give your health care provider a list of all the medicines, herbs, non-prescription drugs, or dietary supplements you use. Also tell them if you smoke, drink alcohol, or use illegal drugs. Some items may interact with your medicine. What should I watch for while using this medicine? Visit your doctor or health care professional for regular checks on your progress. If you are taking this medicine over a prolonged period,  carry an identification card with your name and address, the type and dose of your medicine, and your doctor's name and address. This medicine may increase your risk of getting an infection. Tell your doctor or health care professional if you are around anyone with measles or chickenpox, or if you develop sores or blisters that do not heal properly. If you are going to have surgery, tell your doctor or health care professional that you have taken this medicine within the last twelve months. Ask your doctor or health care professional about your diet. You may need to lower the amount of salt you eat. This medicine may affect blood sugar levels. If you have diabetes, check with your doctor or health care professional before you change your diet or the dose of your diabetic medicine. What side effects may I notice from receiving this medicine? Side effects that you should report to your doctor or health care professional as soon as possible: -allergic reactions like skin rash,  itching or hives, swelling of the face, lips, or tongue -changes in emotions or moods -changes in vision -depressed mood -eye pain -fever or chills, cough, sore throat, pain or difficulty passing urine -increased thirst -swelling of ankles, feet Side effects that usually do not require medical attention (report to your doctor or health care professional if they continue or are bothersome): -confusion, excitement, restlessness -headache -nausea, vomiting -skin problems, acne, thin and shiny skin -trouble sleeping -weight gain This list may not describe all possible side effects. Call your doctor for medical advice about side effects. You may report side effects to FDA at 1-800-FDA-1088. Where should I keep my medicine? Keep out of the reach of children. Store at room temperature between 15 and 30 degrees C (59 and 86 degrees F). Protect from light. Keep container tightly closed. Throw away any unused medicine after the expiration date. NOTE: This sheet is a summary. It may not cover all possible information. If you have questions about this medicine, talk to your doctor, pharmacist, or health care provider.  2015, [employer]. (2011-05-17 10:57:14)  Metoclopramide tablets What is this medicine? METOCLOPRAMIDE (met oh kloe PRA mide) is used to treat the symptoms of gastroesophageal reflux disease (GERD) like heartburn. It is also used to treat people with slow emptying of the stomach and intestinal tract. This medicine may be used for other purposes; ask your health care provider or pharmacist if you have questions. COMMON BRAND NAME(S): Reglan What should I tell my health care provider before I take this medicine? They need to know if you have any of these conditions: -breast cancer -depression -diabetes -heart failure -high blood pressure -kidney disease -liver disease -Parkinson's disease or a movement disorder -pheochromocytoma -seizures -stomach obstruction,  bleeding, or perforation -an unusual or allergic reaction to metoclopramide, procainamide, sulfites, other medicines, foods, dyes, or preservatives -pregnant or trying to get pregnant -breast-feeding How should I use this medicine? Take this medicine by mouth with a glass of water. Follow the directions on the prescription label. Take this medicine on an empty stomach, about 30 minutes before eating. Take your doses at regular intervals. Do not take your medicine more often than directed. Do not stop taking except on the advice of your doctor or health care professional. A special MedGuide will be given to you by the pharmacist with each prescription and refill. Be sure to read this information carefully each time. Talk to your pediatrician regarding the use of this medicine in children. Special care may be needed.  Overdosage: If you think you have taken too much of this medicine contact a poison control center or emergency room at once. NOTE: This medicine is only for you. Do not share this medicine with others. What if I miss a dose? If you miss a dose, take it as soon as you can. If it is almost time for your next dose, take only that dose. Do not take double or extra doses. What may interact with this medicine? -acetaminophen -cyclosporine -digoxin -medicines for blood pressure -medicines for diabetes, including insulin -medicines for hay fever and other allergies -medicines for depression, especially an Monoamine Oxidase Inhibitor (MAOI) -medicines for Parkinson's disease, like levodopa -medicines for sleep or for pain -tetracycline This list may not describe all possible interactions. Give your health care provider a list of all the medicines, herbs, non-prescription drugs, or dietary supplements you use. Also tell them if you smoke, drink alcohol, or use illegal drugs. Some items may interact with your medicine. What should I watch for while using this medicine? It may take a few  weeks for your stomach condition to start to get better. However, do not take this medicine for longer than 12 weeks. The longer you take this medicine, and the more you take it, the greater your chances are of developing serious side effects. If you are an elderly patient, a male patient, or you have diabetes, you may be at an increased risk for side effects from this medicine. Contact your doctor immediately if you start having movements you cannot control such as lip smacking, rapid movements of the tongue, involuntary or uncontrollable movements of the eyes, head, arms and legs, or muscle twitches and spasms. Patients and their families should watch out for worsening depression or thoughts of suicide. Also watch out for any sudden or severe changes in feelings such as feeling anxious, agitated, panicky, irritable, hostile, aggressive, impulsive, severely restless, overly excited and hyperactive, or not being able to sleep. If this happens, especially at the beginning of treatment or after a change in dose, call your doctor. Do not treat yourself for high fever. Ask your doctor or health care professional for advice. You may get drowsy or dizzy. Do not drive, use machinery, or do anything that needs mental alertness until you know how this drug affects you. Do not stand or sit up quickly, especially if you are an older patient. This reduces the risk of dizzy or fainting spells. Alcohol can make you more drowsy and dizzy. Avoid alcoholic drinks. What side effects may I notice from receiving this medicine? Side effects that you should report to your doctor or health care professional as soon as possible: -allergic reactions like skin rash, itching or hives, swelling of the face, lips, or tongue -abnormal production of milk in females -breast enlargement in both males and females -change in the way you walk -difficulty moving, speaking or swallowing -drooling, lip smacking, or rapid movements of the  tongue -excessive sweating -fever -involuntary or uncontrollable movements of the eyes, head, arms and legs -irregular heartbeat or palpitations -muscle twitches and spasms -unusually weak or tired Side effects that usually do not require medical attention (report to your doctor or health care professional if they continue or are bothersome): -change in sex drive or performance -depressed mood -diarrhea -difficulty sleeping -headache -menstrual changes -restless or nervous This list may not describe all possible side effects. Call your doctor for medical advice about side effects. You may report side effects to FDA at 1-800-FDA-1088. Where should  I keep my medicine? Keep out of the reach of children. Store at room temperature between 20 and 25 degrees C (68 and 77 degrees F). Protect from light. Keep container tightly closed. Throw away any unused medicine after the expiration date. NOTE: This sheet is a summary. It may not cover all possible information. If you have questions about this medicine, talk to your doctor, pharmacist, or health care provider.  2015, [employer]. (2012-01-29 13:04:38)  Tramadol tablets What is this medicine? TRAMADOL (TRA ma dole) is a pain reliever. It is used to treat moderate to severe pain in adults. This medicine may be used for other purposes; ask your health care provider or pharmacist if you have questions. COMMON BRAND NAME(S): Ultram What should I tell my health care provider before I take this medicine? They need to know if you have any of these conditions: -brain tumor -depression -drug abuse or addiction -head injury -if you frequently drink alcohol containing drinks -kidney disease or trouble passing urine -liver disease -lung disease, asthma, or breathing problems -seizures or epilepsy -suicidal thoughts, plans, or attempt; a previous suicide attempt by you or a family member -an unusual or allergic reaction to tramadol,  codeine, other medicines, foods, dyes, or preservatives -pregnant or trying to get pregnant -breast-feeding How should I use this medicine? Take this medicine by mouth with a full glass of water. Follow the directions on the prescription label. If the medicine upsets your stomach, take it with food or milk. Do not take more medicine than you are told to take. Talk to your pediatrician regarding the use of this medicine in children. Special care may be needed. Overdosage: If you think you have taken too much of this medicine contact a poison control center or emergency room at once. NOTE: This medicine is only for you. Do not share this medicine with others. What if I miss a dose? If you miss a dose, take it as soon as you can. If it is almost time for your next dose, take only that dose. Do not take double or extra doses. What may interact with this medicine? Do not take this medicine with any of the following medications: -MAOIs like Carbex, Eldepryl, Marplan, Nardil, and Parnate This medicine may also interact with the following medications: -alcohol or medicines that contain alcohol -antihistamines -benzodiazepines -bupropion -carbamazepine or oxcarbazepine -clozapine -cyclobenzaprine -digoxin -furazolidone -linezolid -medicines for depression, anxiety, or psychotic disturbances -medicines for migraine headache like almotriptan, eletriptan, frovatriptan, naratriptan, rizatriptan, sumatriptan, zolmitriptan -medicines for pain like pentazocine, buprenorphine, butorphanol, meperidine, nalbuphine, and propoxyphene -medicines for sleep -muscle relaxants -naltrexone -phenobarbital -phenothiazines like perphenazine, thioridazine, chlorpromazine, mesoridazine, fluphenazine, prochlorperazine, promazine, and trifluoperazine -procarbazine -warfarin This list may not describe all possible interactions. Give your health care provider a list of all the medicines, herbs, non-prescription drugs,  or dietary supplements you use. Also tell them if you smoke, drink alcohol, or use illegal drugs. Some items may interact with your medicine. What should I watch for while using this medicine? Tell your doctor or health care professional if your pain does not go away, if it gets worse, or if you have new or a different type of pain. You may develop tolerance to the medicine. Tolerance means that you will need a higher dose of the medicine for pain relief. Tolerance is normal and is expected if you take this medicine for a long time. Do not suddenly stop taking your medicine because you may develop a severe reaction. Your body becomes used to  the medicine. This does NOT mean you are addicted. Addiction is a behavior related to getting and using a drug for a non-medical reason. If you have pain, you have a medical reason to take pain medicine. Your doctor will tell you how much medicine to take. If your doctor wants you to stop the medicine, the dose will be slowly lowered over time to avoid any side effects. You may get drowsy or dizzy. Do not drive, use machinery, or do anything that needs mental alertness until you know how this medicine affects you. Do not stand or sit up quickly, especially if you are an older patient. This reduces the risk of dizzy or fainting spells. Alcohol can increase or decrease the effects of this medicine. Avoid alcoholic drinks. You may have constipation. Try to have a bowel movement at least every 2 to 3 days. If you do not have a bowel movement for 3 days, call your doctor or health care professional. Your mouth may get dry. Chewing sugarless gum or sucking hard candy, and drinking plenty of water may help. Contact your doctor if the problem does not go away or is severe. What side effects may I notice from receiving this medicine? Side effects that you should report to your doctor or health care professional as soon as possible: -allergic reactions like skin rash, itching or  hives, swelling of the face, lips, or tongue -breathing difficulties, wheezing -confusion -itching -light headedness or fainting spells -redness, blistering, peeling or loosening of the skin, including inside the mouth -seizures Side effects that usually do not require medical attention (report to your doctor or health care professional if they continue or are bothersome): -constipation -dizziness -drowsiness -headache -nausea, vomiting This list may not describe all possible side effects. Call your doctor for medical advice about side effects. You may report side effects to FDA at 1-800-FDA-1088. Where should I keep my medicine? Keep out of the reach of children. Store at room temperature between 15 and 30 degrees C (59 and 86 degrees F). Keep container tightly closed. Throw away any unused medicine after the expiration date. NOTE: This sheet is a summary. It may not cover all possible information. If you have questions about this medicine, talk to your doctor, pharmacist, or health care provider.  2015, [employer] Standard. (2010-06-14 11:55:44)  Chlorpheniramine; Hydrocodone oral solution or suspension What is this medicine? CHLORPHENIRAMINE; HYDROCODONE (klor fen IR a meen; hye droe KOE done) is a combination of an antihistamine and cough suppressant. It is used to treat the symptoms of allergies and colds. This medicine may be used for other purposes; ask your health care provider or pharmacist if you have questions. COMMON BRAND NAME(S): HyTan, Novasus, S-T Forte 2, Tussionex, VITUZ What should I tell my health care provider before I take this medicine? They need to know if you have any of these conditions: -diabetes -head injury -intestine problems -kidney disease -liver disease -other chronic illness -trouble breathing -an unusual or allergic reaction to chlorpheniramine, codeine, hydrocodone, other pain medicines, foods, dyes or preservatives -pregnant or trying to get  pregnant -breast-feeding How should I use this medicine? Take this medicine by mouth with a glass of water. Follow the directions on the prescription label. Shake well before using. Do not mix your dose with other fluids or medicines before you take it. Use a specially marked spoon or container to measure your medicine. Household spoons are not accurate. Take your doses at regular intervals. Do not take your medicine more often than  directed. Talk to your pediatrician regarding the use of this medicine in children. This medicine is not approved for use in children less than 14 years old. Overdosage: If you think you have taken too much of this medicine contact a poison control center or emergency room at once. NOTE: This medicine is only for you. Do not share this medicine with others. What if I miss a dose? If you miss a dose, take it as soon as you can. If it is almost time for your next dose, take only that dose. Do not take double or extra doses. What may interact with this medicine? Do not take this medicine with any of the following medications: -alcohol This medicine may also interact with the following medications: -antihistamines for cold or allergy -medicines for depression or other mental disturbances -muscle relaxants -narcotic medicines (opiates) for pain -some medicines for anxiety or sleep -some medicines for Parkinson's disease -some medicines for stomach problems -tramadol This list may not describe all possible interactions. Give your health care provider a list of all the medicines, herbs, non-prescription drugs, or dietary supplements you use. Also tell them if you smoke, drink alcohol, or use illegal drugs. Some items may interact with your medicine. What should I watch for while using this medicine? Tell your doctor or health care professional if your symptoms do not improve or if they get worse. If you also have a high fever, skin rash, continuing headache, or sore  throat, see your doctor. You may develop tolerance to this medicine if you take it for a long time. Tolerance means that you will get less cough relief with time. Do not suddenly stop taking your medicine because you may develop a severe reaction. Your body becomes used to the medicine. This does NOT mean you are addicted. Addiction is a behavior related to getting and using a drug for a non-medical reason. If your doctor wants you to stop the medicine, the dose will be slowly lowered over time to avoid any side effects. You may get drowsy or dizzy. Do not drive, use machinery, or do anything that needs mental alertness until you know how this medicine affects you. Do not stand or sit up quickly, especially if you are an older patient. This reduces the risk of dizzy or fainting spells. Alcohol may interfere with the effect of this medicine. Avoid alcoholic drinks. The medicine will cause constipation. Try to have a bowel movement at least every 2 to 3 days. If you do not have a bowel movement for 3 days, call your doctor or health care professional. This medicine may cause dry eyes and blurred vision. If you wear contact lenses you may feel some discomfort. Lubricating drops may help. See your eye doctor if the problem does not go away or is severe. What side effects may I notice from receiving this medicine? Side effects that you should report to your doctor or health care professional as soon as possible: -allergic reactions like skin rash, itching or hives, swelling of the face, lips, or tongue -breathing problems -changes in vision -chest tightness -cold, clammy skin -confusion, anxiety, fear -trouble passing urine or change in the amount of urine -unusually slow heartbeat -unusually weak or tired Side effects that usually do not require medical attention (report to your doctor or health care professional if they continue or are bothersome): -constipation -dry throat -nausea,  vomiting -stomach upset This list may not describe all possible side effects. Call your doctor for medical advice about side  effects. You may report side effects to FDA at 1-800-FDA-1088. Where should I keep my medicine? Keep out of the reach of children. This medicine can be abused. Keep this medicine in a safe place to protect it from theft. Do not share this medicine with anyone. Selling or giving away this medicine is dangerous and is against the law. Store at room temperature between 15 and 30 degrees C (59 and 86 degrees F). Do not freeze. Keep container tightly closed. Throw away any unused medicine after the expiration date. Discard unused medicine and used packaging carefully. Pets and children can be harmed if they find used or lost packages. NOTE: This sheet is a summary. It may not cover all possible information. If you have questions about this medicine, talk to your doctor, pharmacist, or health care provider.  2015, [employer]vier/Gold Standard. (2011-06-12 11:12:39)

## 2014-08-11 LAB — CULTURE, GROUP A STREP

## 2015-01-12 ENCOUNTER — Encounter (HOSPITAL_COMMUNITY): Payer: Self-pay

## 2015-01-12 ENCOUNTER — Encounter (HOSPITAL_COMMUNITY): Payer: Self-pay | Admitting: *Deleted

## 2015-01-12 ENCOUNTER — Inpatient Hospital Stay (HOSPITAL_COMMUNITY)
Admission: EM | Admit: 2015-01-12 | Discharge: 2015-01-20 | DRG: 897 | Disposition: A | Discharge status: Home / Self Care | Payer: Federal, State, Local not specified - Other | Source: Intra-hospital | Attending: Psychiatry | Admitting: Psychiatry

## 2015-01-12 ENCOUNTER — Ambulatory Visit (HOSPITAL_COMMUNITY)
Admission: EM | Admit: 2015-01-12 | Discharge: 2015-01-12 | Disposition: A | Discharge status: Home / Self Care | Payer: Federal, State, Local not specified - Other | Source: Intra-hospital | Attending: Psychiatry | Admitting: Psychiatry

## 2015-01-12 ENCOUNTER — Emergency Department (HOSPITAL_COMMUNITY)
Admission: EM | Admit: 2015-01-12 | Discharge: 2015-01-12 | Disposition: A | Discharge status: Home / Self Care | Payer: Self-pay | Attending: Emergency Medicine | Admitting: Emergency Medicine

## 2015-01-12 DIAGNOSIS — F11229 Opioid dependence with intoxication, unspecified: Secondary | ICD-10-CM | POA: Diagnosis not present

## 2015-01-12 DIAGNOSIS — G4700 Insomnia, unspecified: Secondary | ICD-10-CM | POA: Diagnosis present

## 2015-01-12 DIAGNOSIS — F1410 Cocaine abuse, uncomplicated: Secondary | ICD-10-CM | POA: Insufficient documentation

## 2015-01-12 DIAGNOSIS — R45851 Suicidal ideations: Secondary | ICD-10-CM | POA: Diagnosis present

## 2015-01-12 DIAGNOSIS — F1910 Other psychoactive substance abuse, uncomplicated: Secondary | ICD-10-CM

## 2015-01-12 DIAGNOSIS — J45909 Unspecified asthma, uncomplicated: Secondary | ICD-10-CM | POA: Diagnosis present

## 2015-01-12 DIAGNOSIS — F411 Generalized anxiety disorder: Secondary | ICD-10-CM | POA: Diagnosis present

## 2015-01-12 DIAGNOSIS — F1110 Opioid abuse, uncomplicated: Secondary | ICD-10-CM | POA: Insufficient documentation

## 2015-01-12 DIAGNOSIS — F419 Anxiety disorder, unspecified: Secondary | ICD-10-CM | POA: Diagnosis present

## 2015-01-12 DIAGNOSIS — F329 Major depressive disorder, single episode, unspecified: Secondary | ICD-10-CM | POA: Diagnosis present

## 2015-01-12 DIAGNOSIS — F1994 Other psychoactive substance use, unspecified with psychoactive substance-induced mood disorder: Secondary | ICD-10-CM | POA: Diagnosis present

## 2015-01-12 DIAGNOSIS — F17210 Nicotine dependence, cigarettes, uncomplicated: Secondary | ICD-10-CM | POA: Diagnosis present

## 2015-01-12 DIAGNOSIS — G43909 Migraine, unspecified, not intractable, without status migrainosus: Secondary | ICD-10-CM | POA: Diagnosis present

## 2015-01-12 DIAGNOSIS — F1310 Sedative, hypnotic or anxiolytic abuse, uncomplicated: Secondary | ICD-10-CM | POA: Insufficient documentation

## 2015-01-12 DIAGNOSIS — F1920 Other psychoactive substance dependence, uncomplicated: Secondary | ICD-10-CM

## 2015-01-12 DIAGNOSIS — G8929 Other chronic pain: Secondary | ICD-10-CM | POA: Diagnosis present

## 2015-01-12 DIAGNOSIS — F11220 Opioid dependence with intoxication, uncomplicated: Secondary | ICD-10-CM | POA: Diagnosis not present

## 2015-01-12 DIAGNOSIS — F1123 Opioid dependence with withdrawal: Principal | ICD-10-CM | POA: Diagnosis present

## 2015-01-12 DIAGNOSIS — F1320 Sedative, hypnotic or anxiolytic dependence, uncomplicated: Secondary | ICD-10-CM | POA: Diagnosis present

## 2015-01-12 DIAGNOSIS — F1120 Opioid dependence, uncomplicated: Secondary | ICD-10-CM | POA: Diagnosis present

## 2015-01-12 DIAGNOSIS — Z720 Tobacco use: Secondary | ICD-10-CM | POA: Insufficient documentation

## 2015-01-12 DIAGNOSIS — J449 Chronic obstructive pulmonary disease, unspecified: Secondary | ICD-10-CM | POA: Diagnosis present

## 2015-01-12 DIAGNOSIS — Z7952 Long term (current) use of systemic steroids: Secondary | ICD-10-CM | POA: Insufficient documentation

## 2015-01-12 DIAGNOSIS — Z79899 Other long term (current) drug therapy: Secondary | ICD-10-CM | POA: Insufficient documentation

## 2015-01-12 DIAGNOSIS — Z791 Long term (current) use of non-steroidal anti-inflammatories (NSAID): Secondary | ICD-10-CM | POA: Insufficient documentation

## 2015-01-12 DIAGNOSIS — E669 Obesity, unspecified: Secondary | ICD-10-CM | POA: Insufficient documentation

## 2015-01-12 LAB — COMPREHENSIVE METABOLIC PANEL
ALT: 19 U/L (ref 0–53)
ANION GAP: 8 (ref 5–15)
AST: 18 U/L (ref 0–37)
Albumin: 4.2 g/dL (ref 3.5–5.2)
Alkaline Phosphatase: 77 U/L (ref 39–117)
BUN: 26 mg/dL — ABNORMAL HIGH (ref 6–23)
CO2: 26 mmol/L (ref 19–32)
Calcium: 9.1 mg/dL (ref 8.4–10.5)
Chloride: 106 mmol/L (ref 96–112)
Creatinine, Ser: 1.25 mg/dL (ref 0.50–1.35)
GFR calc Af Amer: 84 mL/min — ABNORMAL LOW (ref >90)
GFR calc non Af Amer: 73 mL/min — ABNORMAL LOW (ref >90)
Glucose, Bld: 101 mg/dL — ABNORMAL HIGH (ref 70–99)
Potassium: 3.7 mmol/L (ref 3.5–5.1)
Sodium: 140 mmol/L (ref 135–145)
TOTAL PROTEIN: 7.3 g/dL (ref 6.0–8.3)
Total Bilirubin: 0.5 mg/dL (ref 0.3–1.2)

## 2015-01-12 LAB — ACETAMINOPHEN LEVEL: ACETAMINOPHEN (TYLENOL), SERUM: 19.6 ug/mL (ref 10–30)

## 2015-01-12 LAB — RAPID URINE DRUG SCREEN, HOSP PERFORMED
AMPHETAMINES: NOT DETECTED
Barbiturates: NOT DETECTED
Benzodiazepines: POSITIVE — AB
Cocaine: POSITIVE — AB
OPIATES: POSITIVE — AB
Tetrahydrocannabinol: NOT DETECTED

## 2015-01-12 LAB — ETHANOL: Alcohol, Ethyl (B): <5 mg/dL (ref 0–9)

## 2015-01-12 LAB — CBC
HCT: 38 % — ABNORMAL LOW (ref 39.0–52.0)
HEMOGLOBIN: 12.5 g/dL — AB (ref 13.0–17.0)
MCH: 27.8 pg (ref 26.0–34.0)
MCHC: 32.9 g/dL (ref 30.0–36.0)
MCV: 84.4 fL (ref 78.0–100.0)
Platelets: 205 10*3/uL (ref 150–400)
RBC: 4.5 MIL/uL (ref 4.22–5.81)
RDW: 14 % (ref 11.5–15.5)
WBC: 6.7 10*3/uL (ref 4.0–10.5)

## 2015-01-12 LAB — SALICYLATE LEVEL: Salicylate Lvl: <4 mg/dL (ref 2.8–20.0)

## 2015-01-12 MED ORDER — CLONIDINE HCL 0.1 MG PO TABS
0.1000 mg | ORAL_TABLET | ORAL | Status: AC
  Administered 2015-01-15 – 2015-01-17 (×3): 0.1 mg via ORAL
  Filled 2015-01-12 (×5): qty 1

## 2015-01-12 MED ORDER — METHOCARBAMOL 500 MG PO TABS
500.0000 mg | ORAL_TABLET | Freq: Three times a day (TID) | ORAL | Status: DC | PRN
  Administered 2015-01-13 – 2015-01-14 (×4): 500 mg via ORAL
  Filled 2015-01-12 (×5): qty 1

## 2015-01-12 MED ORDER — DICYCLOMINE HCL 20 MG PO TABS
20.0000 mg | ORAL_TABLET | Freq: Four times a day (QID) | ORAL | Status: AC | PRN
  Administered 2015-01-13: 20 mg via ORAL
  Filled 2015-01-12: qty 1

## 2015-01-12 MED ORDER — CLONIDINE HCL 0.1 MG PO TABS
0.1000 mg | ORAL_TABLET | Freq: Four times a day (QID) | ORAL | Status: AC
  Administered 2015-01-13 – 2015-01-15 (×10): 0.1 mg via ORAL
  Filled 2015-01-12 (×11): qty 1

## 2015-01-12 MED ORDER — LOPERAMIDE HCL 2 MG PO CAPS: 2.0000 mg | ORAL_CAPSULE | ORAL | Status: AC | PRN

## 2015-01-12 MED ORDER — ALUM & MAG HYDROXIDE-SIMETH 200-200-20 MG/5ML PO SUSP: 30.0000 mL | ORAL | Status: DC | PRN

## 2015-01-12 MED ORDER — CLONIDINE HCL 0.1 MG PO TABS
0.1000 mg | ORAL_TABLET | Freq: Every day | ORAL | Status: AC
  Administered 2015-01-16 – 2015-01-18 (×2): 0.1 mg via ORAL
  Filled 2015-01-12 (×2): qty 1

## 2015-01-12 MED ORDER — TRAZODONE HCL 50 MG PO TABS
50.0000 mg | ORAL_TABLET | Freq: Every evening | ORAL | Status: DC | PRN
  Filled 2015-01-12: qty 1

## 2015-01-12 MED ORDER — ONDANSETRON 4 MG PO TBDP
4.0000 mg | ORAL_TABLET | Freq: Four times a day (QID) | ORAL | Status: AC | PRN
  Administered 2015-01-13: 4 mg via ORAL
  Filled 2015-01-12: qty 1

## 2015-01-12 MED ORDER — ACETAMINOPHEN 325 MG PO TABS
650.0000 mg | ORAL_TABLET | ORAL | Status: DC | PRN
  Administered 2015-01-13 – 2015-01-19 (×6): 650 mg via ORAL
  Filled 2015-01-12 (×6): qty 2

## 2015-01-12 MED ORDER — NAPROXEN 500 MG PO TABS
500.0000 mg | ORAL_TABLET | Freq: Two times a day (BID) | ORAL | Status: DC | PRN
  Administered 2015-01-13 – 2015-01-16 (×3): 500 mg via ORAL
  Filled 2015-01-12 (×3): qty 1

## 2015-01-12 MED ORDER — ALBUTEROL SULFATE HFA 108 (90 BASE) MCG/ACT IN AERS
2.0000 | INHALATION_SPRAY | RESPIRATORY_TRACT | Status: DC | PRN
Start: 2015-01-12 — End: 2015-01-20
  Filled 2015-01-12: qty 6.7

## 2015-01-12 MED ORDER — HYDROXYZINE HCL 25 MG PO TABS
25.0000 mg | ORAL_TABLET | Freq: Four times a day (QID) | ORAL | Status: AC | PRN
  Administered 2015-01-13 – 2015-01-17 (×10): 25 mg via ORAL
  Filled 2015-01-12 (×10): qty 1

## 2015-01-12 MED ORDER — MAGNESIUM HYDROXIDE 400 MG/5ML PO SUSP: 30.0000 mL | Freq: Every day | ORAL | Status: DC | PRN

## 2015-01-12 NOTE — ED Provider Notes (Signed)
CSN: [id number]     Arrival date & time 01/12/15  2105 History   First MD Initiated Contact with Patient 01/12/15 2148     Chief Complaint  Patient presents with  . Medical Clearance     (Consider location/radiation/quality/duration/timing/severity/associated sxs/prior Treatment) HPI Comments: 31 rolled male who [facility] for medical clearance prior to admission for detox. He denies any medical complaints including current illness, fevers, abdominal pain, nausea vomiting, chest pain, or shortness of breath.  Patient is a 39 y.o. male presenting with mental health disorder.  Mental Health Problem Presenting symptoms comment:  Substance abuse Patient accompanied by:  Family member Degree of incapacity (severity):  Severe Onset quality:  Gradual Duration: Chronic. Timing:  Constant Progression:  Worsening Chronicity:  Chronic Context comment:  "I'm tired of hurting the people in love me" Relieved by:  Nothing Worsened by:  Nothing tried Associated symptoms: anhedonia and poor judgment     Past Medical History  Diagnosis Date  . Asthma   . Obesity   . COPD (chronic obstructive pulmonary disease)   . Emphysema   . Cigarette smoker    Past Surgical History  Procedure Laterality Date  . Ankle surgery     Family History  Problem Relation Age of Onset  . COPD Neg Hx   . Birth defects Sister    History  Substance Use Topics  . Smoking status: Current Every Day Smoker -- 0.25 packs/day    Types: Cigarettes  . Smokeless tobacco: Never Used  . Alcohol Use: Yes     Comment: occ    Review of Systems  All other systems reviewed and are negative.     Allergies  Review of patient's allergies indicates no known allergies.  Home Medications   Prior to Admission medications   Medication Sig Start Date End Date Taking? Authorizing Provider  acetaminophen (TYLENOL) 325 MG tablet Take 325-650 mg by mouth every 4 (four) hours as needed for mild  pain, moderate pain or headache. Patient states he has taken at least 10-15 tylenol tablets today with no pain relief   Yes Historical Provider, MD  albuterol (PROVENTIL HFA;VENTOLIN HFA) 108 (90 BASE) MCG/ACT inhaler Inhale 2 puffs into the lungs every 4 (four) hours as needed for wheezing or shortness of breath. 05/18/14  Yes Tresa Garter, MD  naproxen sodium (ANAPROX) 220 MG tablet Take 1,540 mg by mouth as needed (pain).   Yes Historical Provider, MD  chlorpheniramine-HYDROcodone (TUSSIONEX PENNKINETIC ER) 10-8 MG/5ML LQCR Take 5 mLs by mouth every 12 (twelve) hours as needed for cough. Patient not taking: Reported on 01/12/2015 92/11/94   Delora Fuel, MD  Fluticasone-Salmeterol (ADVAIR) 250-50 MCG/DOSE AEPB Inhale 1 puff into the lungs 2 (two) times daily. Patient not taking: Reported on 01/12/2015 05/18/14   Billy Fischer, MD  metoCLOPramide (REGLAN) 10 MG tablet Take 1 tablet (10 mg total) by mouth every 6 (six) hours as needed for nausea. Patient not taking: Reported on 01/12/2015 17/40/81   Delora Fuel, MD  predniSONE (DELTASONE) 20 MG tablet Take 3 tablets (60 mg total) by mouth daily. Patient not taking: Reported on 01/12/2015 44/81/85   Delora Fuel, MD  traMADol (ULTRAM) 50 MG tablet Take 1 tablet (50 mg total) by mouth every 6 (six) hours as needed. Patient not taking: Reported on 01/12/2015 63/14/97   Delora Fuel, MD   BP 026/37 mmHg  Pulse 92  Temp(Src) 98.7 F (37.1 C) (Oral)  Resp 20  Ht 6\' 1"  (1.854  m)  Wt 300 lb (136.079 kg)  BMI 39.59 kg/m2  SpO2 98% Physical Exam  Constitutional: He is oriented to person, place, and time. He appears well-developed and well-nourished. No distress.  HENT:  Head: Normocephalic and atraumatic.  Eyes: Conjunctivae are normal. No scleral icterus.  Neck: Neck supple.  Cardiovascular: Normal rate and intact distal pulses.   Pulmonary/Chest: Effort normal. No stridor. No respiratory distress.  Abdominal: Normal appearance. He exhibits no  distension.  Neurological: He is alert and oriented to person, place, and time.  Skin: Skin is warm and dry. No rash noted.  Psychiatric: He has a normal mood and affect. He is withdrawn (slightly).  Nursing note and vitals reviewed.   ED Course  Procedures (including critical care time) Labs Review Labs Reviewed  CBC - Abnormal; Notable for the following:    Hemoglobin 12.5 (*)    HCT 38.0 (*)    All other components within normal limits  COMPREHENSIVE METABOLIC PANEL - Abnormal; Notable for the following:    Glucose, Bld 101 (*)    BUN 26 (*)    GFR calc non Af Amer 73 (*)    GFR calc Af Amer 84 (*)    All other components within normal limits  URINE RAPID DRUG SCREEN (HOSP PERFORMED) - Abnormal; Notable for the following:    Opiates POSITIVE (*)    Cocaine POSITIVE (*)    Benzodiazepines POSITIVE (*)    All other components within normal limits  ACETAMINOPHEN LEVEL  ETHANOL  SALICYLATE LEVEL    Imaging Review No results found.   EKG Interpretation None      MDM   Final diagnoses:  None    39 male presenting for medical clearance before being admitted to the [facility] for detox. He is well appearing, has no medical complaints, and labs are unremarkable. Medically cleared and discharged for admission to behavioral health [facility].    Serita Grit, MD 01/13/15 [phone]

## 2015-01-12 NOTE — ED Notes (Signed)
Report given to RN at [facility] and Betsy Pries coming to get the patient

## 2015-01-12 NOTE — BH Assessment (Addendum)
Tele Assessment Note   Danny Hale is a 39 y.o., engaged, employed, Caucasian male presenting to [facility] as a walk-in requesting detox from opiates and benzos. Pt also presents with depression with SI with a plan to "get high and shoot myself". Pt presents with depressed mood and congruent affect, becoming tearful during assessment. Pt is open and cooperative, fair eye-contact, logical thought process, and linear speech pattern. Pt is engaged and states that his drug use is affecting his relationship with his fiancee and his ability to work, adding, "I'm going to lose my family and my home". Pt also has a hx of legal issues related to his SA. Pt states that he has accidentally overdosed three times in the past. Pt endorses depressive sx, including hopelessness, fatigue, insomnia, feelings of worthlessness, guilt, crying spells, anhedonia, irritability, social isolation, and suicidal thoughts. Pt reports thinking of various plans to kill himself, such as taking large doses of drugs and then shooting himself. Pt denies any access to weapons. He denies HI. He reports a hx of auditory and visual hallucinations of "people who aren't really there". He states that he has these hallucinations even when not under the influence of substances.  Pt reports daily use of prescription pain pills, regular use of anxiolytics (i.e. Xanax), and occasional use of heroin when pain pills cannot be located. Pt reports a hx of cocaine use as well but denies any current use of this drug. Pt denies any past suicide attempts. He reports two prior hospitalizations following (unintentional) overdoses of drugs - at [facility] and [facility]. Pt also states that he went to "mental health" about 3 years ago for treatment and was tried on various psychiatric medications, which he states did not work for him.  Per Danny Clan, PA, pt meets inpt criteria and is accepted to bed 305-2 by Danny Hale.  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 Asthma   . Obesity   . COPD (chronic obstructive pulmonary disease)   . Emphysema   . Cigarette smoker    Axis IV: economic problems, occupational problems, other psychosocial or environmental problems, problems related to legal system/crime, problems with access to health care services and problems with primary support group Axis V: 31-40 impairment in reality testing  Past Medical History:  Past Medical History  Diagnosis Date  . Asthma   . Obesity   . COPD (chronic obstructive pulmonary disease)   . Emphysema   . Cigarette smoker     Past Surgical History  Procedure Laterality Date  . Ankle surgery      Family History:  Family History  Problem Relation Age of Onset  . COPD Neg Hx   . Birth defects Sister     Social History:  reports that he has been smoking Cigarettes.  He has been smoking about 0.25 packs per day. He has never used smokeless tobacco. He reports that he drinks alcohol. He reports that he does not use illicit drugs.  Additional Social History:  Alcohol / Drug Use Pain Medications: Abuse of opiate painkillers, non-prescribed Prescriptions: See PTA List Over the Counter: See PTA Lisr History of alcohol / drug use?: Yes Longest period of sobriety (when/how long): 2-3 months, Pt cannot remember last time he was sober Negative Consequences of Use: Financial, Legal, Personal relationships,  Work / Youth worker Withdrawal Symptoms: Cramps, Patient aware of relationship between substance abuse and physical/medical complications, Nausea / Vomiting, Irritability, Diarrhea, Sweats, Weakness Substance #1 Name of Substance 1: Opiates (Pain pills) 1 - Age of First Use: 20's 1 - Amount (size/oz): Up to 50mg   1 - Frequency: Daily 1 - Duration: Many years 1 - Last Use / Amount: Today  01/12/15 Substance #2 Name of Substance 2: Anxiolytics (Xanax) 2 - Age of First Use: 20's 2 - Amount (size/oz): "Up to 4 pills a day" 2 - Frequency: Daily or when stressed 2 - Duration: Many years 2 - Last Use / Amount: Today, 01/12/15 Substance #3 Name of Substance 3: Heroin 3 - Age of First Use: 28 3 - Amount (size/oz): "$20 bag" 3 - Frequency: A few times per month when pt cannot locate pills 3 - Duration: 10 years 3 - Last Use / Amount: Unknown  CIWA:   COWS:    PATIENT STRENGTHS: (choose at least two) Ability for insight Average or above average intelligence Communication skills Motivation for treatment/growth Work skills  Allergies: No Known Allergies  Home Medications:  (Not in a hospital admission)  OB/GYN Status:  No LMP for male patient.  General Assessment Data Location of Assessment: [facility] Is this a Tele or Face-to-Face Assessment?: Face-to-Face Is this an Initial Assessment or a Re-assessment for this encounter?: Initial Assessment Living Arrangements: Spouse/significant other, Children Can pt return to current living arrangement?: Yes Admission Status: Voluntary Is patient capable of signing voluntary admission?: Yes Transfer from: Home Referral Source: Self/Family/Friend     [place] Living Arrangements: Spouse/significant other, Children Name of Psychiatrist: None Name of Therapist: None  Education Status Is patient currently in school?: No Current Grade: na Highest grade of school patient has completed: [name] Name of school: na Contact person: na  Risk to self with the past 6 months Suicidal Ideation: Yes-Currently Present Suicidal Intent: No Is patient at risk for suicide?: Yes Suicidal Plan?: Yes-Currently Present Specify Current Suicidal Plan: OD, Shoot self Access to Means: Yes Specify Access to Suicidal Means: Medications What has been your use of drugs/alcohol within the last 12 months?: Etoh,  anxiolytic, opiate, and cocaine use Previous Attempts/Gestures: No How many times?:  (3 unintentional overdoses in past) Other Self Harm Risks: SA Intentional Self Injurious Behavior: None Family Suicide History: No Recent stressful life event(s): Conflict (Comment), Job Loss, Financial Problems (Conflict with fiancee in regards to drug use) Persecutory voices/beliefs?: No Depression: Yes Depression Symptoms: Despondent, Insomnia, Tearfulness, Fatigue, Guilt, Loss of interest in usual pleasures, Feeling worthless/self pity, Feeling angry/irritable Substance abuse history and/or treatment for substance abuse?: Yes Suicide prevention information given to non-admitted patients: Not applicable  Risk to Others within the past 6 months Homicidal Ideation: No Thoughts of Harm to Others: No Current Homicidal Intent: No Current Homicidal Plan: No Access to Homicidal Means: No History of harm to others?: No Assessment of Violence: None Noted Violent Behavior Description: None noted Does patient have access to weapons?: No Criminal Charges Pending?: No Does patient have a court date: No  Psychosis Hallucinations: Auditory, Visual Delusions: None noted  Mental Status Report Appearance/Hygiene: Disheveled Eye Contact: Fair Motor Activity: Freedom of movement Speech: Logical/coherent Level of Consciousness: Quiet/awake Mood: Depressed Affect: Appropriate to circumstance, Depressed Anxiety Level: Moderate Thought Processes: Coherent, Relevant Judgement: Impaired Orientation: Person, Place, Time, Situation Obsessive Compulsive Thoughts/Behaviors: None  Cognitive Functioning Concentration: Decreased Memory: Recent Intact IQ: Average Insight: Fair Impulse Control: Poor Appetite: Poor Weight  Loss: 70 (in past 8 months) Weight Gain: 0 Sleep: Decreased Total Hours of Sleep: 3 Vegetative Symptoms: None  ADLScreening [facility] Assessment Services) Patient's cognitive ability adequate to  safely complete daily activities?: Yes Patient able to express need for assistance with ADLs?: Yes Independently performs ADLs?: Yes (appropriate for developmental age)  Prior Inpatient Therapy Prior Inpatient Therapy: Yes Prior Therapy Dates: 2010 Prior Therapy Facilty/Provider([facility]): [facility] Reason for Treatment: Overdose  Prior Outpatient Therapy Prior Outpatient Therapy: Yes Prior Therapy Dates: 2013 Prior Therapy Facilty/Provider(s): "Mental Health" Reason for Treatment: Depression, SA  ADL Screening (condition at time of admission) Patient's cognitive ability adequate to safely complete daily activities?: Yes Is the patient deaf or have difficulty hearing?: No Does the patient have difficulty seeing, even when wearing glasses/contacts?: No Does the patient have difficulty concentrating, remembering, or making decisions?: No Patient able to express need for assistance with ADLs?: Yes Does the patient have difficulty dressing or bathing?: No Independently performs ADLs?: Yes (appropriate for developmental age) Does the patient have difficulty walking or climbing stairs?: No Weakness of Legs: None Weakness of Arms/Hands: None  Home Assistive Devices/Equipment Home Assistive Devices/Equipment: None    Abuse/Neglect Assessment (Assessment to be complete while patient is alone) Physical Abuse: Denies Verbal Abuse: Yes, past (Comment) (In childhood; Pt reports hx of poor relationship with father and mother) Sexual Abuse: Denies Exploitation of patient/patient's resources: Denies Self-Neglect: Denies Values / Beliefs Cultural Requests During Hospitalization: None Spiritual Requests During Hospitalization: None   Advance Directives (For Healthcare) Does patient have an advance directive?: No Would patient like information on creating an advanced directive?: No - patient declined information    Additional Information 1:1 In Past 12 Months?: No CIRT Risk: No Elopement Risk:  No Does patient have medical clearance?: No     Disposition: Per Danny Clan, PA, pt meets inpt criteria and is accepted to bed 305-2 by Danny Hale.  Disposition Initial Assessment Completed for this Encounter: Yes Disposition of Patient: Inpatient treatment program Type of inpatient treatment program: Adult (Dual Dx Tx recommended)  Ramond Dial, [facility] Triage Specialist  01/12/2015 9:13 PM

## 2015-01-12 NOTE — [facility] Counselor (Signed)
Per Patriciaann Clan, PA, pt meets inpt criteria and is appropriate for 300-hall at [facility]. Pending bed availability, pt can return to [facility] for admission following medical clearance at [facility].   Pt was sent to [facility] for medical clearance following his TTS assessment at [facility].   Ramond Dial, [facility] Triage Specialist

## 2015-01-12 NOTE — [facility] Counselor (Signed)
Per Patriciaann Clan, PA, pt meets inpt criteria for 300-hall bed at [facility].   Per Maudie Mercury [facility]), pt is accepted to Bed 305-2 by Dr. Sabra Heck.

## 2015-01-12 NOTE — Discharge Instructions (Signed)
Polysubstance Abuse °When people abuse more than one drug or type of drug it is called polysubstance or polydrug abuse. For example, many smokers also drink alcohol. This is one form of polydrug abuse. Polydrug abuse also refers to the use of a drug to counteract an unpleasant effect produced by another drug. It may also be used to help with withdrawal from another drug. People who take stimulants may become agitated. Sometimes this agitation is countered with a tranquilizer. This helps protect against the unpleasant side effects. Polydrug abuse also refers to the use of different drugs at the same time.  °Anytime drug use is interfering with normal living activities, it has become abuse. This includes problems with family and friends. Psychological dependence has developed when your mind tells you that the drug is needed. This is usually followed by physical dependence which has developed when continuing increases of drug are required to get the same feeling or "high". This is known as addiction or chemical dependency. A person's risk is much higher if there is a history of chemical dependency in the family. °SIGNS OF CHEMICAL DEPENDENCY °· You have been told by friends or family that drugs have become a problem. °· You fight when using drugs. °· You are having blackouts (not remembering what you do while using). °· You feel sick from using drugs but continue using. °· You lie about use or amounts of drugs (chemicals) used. °· You need chemicals to get you going. °· You are suffering in work performance or in school because of drug use. °· You get sick from use of drugs but continue to use anyway. °· You need drugs to relate to people or feel comfortable in social situations. °· You use drugs to forget problems. °"Yes" answered to any of the above signs of chemical dependency indicates there are problems. The longer the use of drugs continues, the greater the problems will become. °If there is a family history of  drug or alcohol use, it is best not to experiment with these drugs. Continual use leads to tolerance. After tolerance develops more of the drug is needed to get the same feeling. This is followed by addiction. With addiction, drugs become the most important part of life. It becomes more important to take drugs than participate in the other usual activities of life. This includes relating to friends and family. Addiction is followed by dependency. Dependency is a condition where drugs are now needed not just to get high, but to feel normal. °Addiction cannot be cured but it can be stopped. This often requires outside help and the care of professionals. Treatment centers are listed in the yellow pages under: Cocaine, Narcotics, and Alcoholics Anonymous. Most hospitals and clinics can refer you to a specialized care center. Talk to your caregiver if you need help. °Document Released: 05/23/2005 Document Revised: 12/24/2011 Document Reviewed: 10/01/2005 °ExitCare® Patient Information ©2015 [employer]. This information is not intended to replace advice given to you by your health care provider. Make sure you discuss any questions you have with your health care provider. ° °

## 2015-01-12 NOTE — ED Notes (Signed)
Pt has been assessed at [facility] and go back there when medically cleared, he is requesting detox from pain medication

## 2015-01-12 NOTE — ED Notes (Signed)
Pt can go to [facility] room 305-2 when medically cleared

## 2015-01-13 DIAGNOSIS — F1920 Other psychoactive substance dependence, uncomplicated: Secondary | ICD-10-CM

## 2015-01-13 DIAGNOSIS — F1120 Opioid dependence, uncomplicated: Secondary | ICD-10-CM | POA: Diagnosis present

## 2015-01-13 MED ORDER — TRAZODONE HCL 50 MG PO TABS
50.0000 mg | ORAL_TABLET | Freq: Every evening | ORAL | Status: DC | PRN
  Administered 2015-01-13 – 2015-01-14 (×5): 50 mg via ORAL
  Filled 2015-01-13 (×11): qty 1

## 2015-01-13 MED ORDER — NICOTINE 21 MG/24HR TD PT24
21.0000 mg | MEDICATED_PATCH | Freq: Every day | TRANSDERMAL | Status: DC
  Administered 2015-01-13 – 2015-01-19 (×7): 21 mg via TRANSDERMAL
  Filled 2015-01-13 (×11): qty 1

## 2015-01-13 NOTE — [facility] Group Notes (Signed)
[place] LCSW Group Therapy  01/13/2015 3:22 PM  Type of Therapy:  Group Therapy  Participation Level:  Active  Participation Quality:  Attentive  Affect:  Flat  Cognitive:  Oriented  Insight:  Improving  Engagement in Therapy:  Improving  Modes of Intervention:  Confrontation, Discussion, Education, Exploration, Problem-solving, Rapport Building, Socialization and Support  Summary of Progress/Problems:  Finding Balance in Life. Today's group focused on defining balance in one's own words, identifying things that can knock one off balance, and exploring healthy ways to maintain balance in life. Group members were asked to provide an example of a time when they felt off balance, describe how they handled that situation,and process healthier ways to regain balance in the future. Group members were asked to share the most important tool for maintaining balance that they learned while at [facility] and how they plan to apply this method after discharge. Danny Hale was attentive and engaged during today's processing group. He shared that for him, being financially stable and having a calm household was necessary for him to feel balanced. "I've been using drugs for 25 years because that's all I know. That's how I deal with stress." Danny Hale shared that he has 9 kids living in his home and is the sole financial provider. He shared that he feels added pressure to get clean and avoid relapse. Danny Hale explored his feelings around raising 9 children and the lack of understanding about addiction that his family has. He was open to giving his family information about Al-Anon and NAMI to help them learn more about mental illness and addiction.    Smart, Shaketta Rill LCSWA 01/13/2015, 3:22 PM

## 2015-01-13 NOTE — H&P (Signed)
Psychiatric Admission Assessment Adult  Patient Identification: Danny Hale MRN:  [id number] Date of Evaluation:  01/13/2015 Chief Complaint:  Alcohol Use Disorder Principal Diagnosis: Polysubstance dependence including opioid type drug, continuous use Diagnosis:   Patient Active Problem List   Diagnosis Date Noted  . Polysubstance dependence including opioid type drug, continuous use [F11.229] 01/13/2015  . Substance induced mood disorder [F19.94] 01/12/2015  . Chronic pain of right ankle [M25.571, G89.29] 05/20/2014  . Adjustment disorder with anxious mood [F43.22] 05/18/2014  . Asthma with acute exacerbation [J45.901] 05/18/2014  . Pain in joint, lower leg [M25.569] 05/18/2014  . Status asthmaticus with COPD (chronic obstructive pulmonary disease) [J44.9] 03/28/2014  . Asthma exacerbation [J45.901] 01/05/2013  . Status asthmaticus [J45.902] 01/05/2013   History of Present Illness: Danny Hale is a 39 y.o., engaged, employed, Caucasian male presenting to [facility] as a walk-in requesting detox from opiates and benzos. Pt also presents with depression with SI with a plan to "get high and shoot myself". Pt presents with depressed mood and congruent affect, becoming tearful during assessment. Pt is open and cooperative, fair eye-contact, logical thought process, and linear speech pattern. Pt is engaged and states that his drug use is affecting his relationship with his fiancee and his ability to work, adding, "I'm going to lose my family and my home". Pt also has a hx of legal issues related to his SA. Pt states that he has accidentally overdosed three times in the past. Pt endorses depressive sx, including hopelessness, fatigue, insomnia, feelings of worthlessness, guilt, crying spells, anhedonia, irritability, social isolation, and suicidal thoughts. Pt reports thinking of various plans to kill himself, such as taking large doses of drugs and then shooting himself. Pt denies any access  to weapons. He denies HI. He reports a hx of auditory and visual hallucinations of "people who aren't really there". He states that he has these hallucinations even when not under the influence of substances.  Pt reports daily use of prescription pain pills, regular use of anxiolytics (i.e. Xanax), and occasional use of heroin when pain pills cannot be located. Pt reports a hx of cocaine use as well but denies any current use of this drug. Pt denies any past suicide attempts. He reports two prior hospitalizations following (unintentional) overdoses of drugs - at [facility] and [facility]. Pt also states that he went to "mental health" about 3 years ago for treatment and was tried on various psychiatric medications, which he states did not work for him.  Elements:  Location:  substance abuse. Quality:  feelings of hopelessness. Severity:  severe. Timing:  in the last few weeks. Duration:  chronic, intermittent. Context:  see HPI. Associated Signs/Symptoms: Depression Symptoms:  depressed mood, hopelessness, suicidal attempt, anxiety, (Hypo) Manic Symptoms:  Labiality of Mood, Anxiety Symptoms:  Social Anxiety, Psychotic Symptoms:  NA PTSD Symptoms: NA Total Time spent with patient: 30 minutes  Past Medical History:  Past Medical History  Diagnosis Date  . Asthma   . Obesity   . COPD (chronic obstructive pulmonary disease)   . Emphysema   . Cigarette smoker     Past Surgical History  Procedure Laterality Date  . Ankle surgery     Family History:  Family History  Problem Relation Age of Onset  . COPD Neg Hx   . Birth defects Sister    Social History:  History  Alcohol Use  . Yes    Comment: occ     History  Drug Use No  History   Social History  . Marital Status: Legally Separated    Spouse Name: N/A  . Number of Children: N/A  . Years of Education: N/A   Social History Main Topics  . Smoking status: Current Every Day Smoker -- 0.25 packs/day    Types: Cigarettes   . Smokeless tobacco: Never Used  . Alcohol Use: Yes     Comment: occ  . Drug Use: No  . Sexual Activity: Yes    Birth Control/ Protection: None   Other Topics Concern  . None   Social History Narrative   Additional Social History:    Pain Medications: Abuse of opiate painkillers, non-prescribed Prescriptions: See PTA List Over the Counter: denies History of alcohol / drug use?: Yes Longest period of sobriety (when/how long): 2-3 months, Pt cannot remember last time he was sober Negative Consequences of Use: Financial, Legal, Personal relationships, Work / School Withdrawal Symptoms: Cramps, Patient aware of relationship between substance abuse and physical/medical complications, Nausea / Vomiting, Irritability, Diarrhea, Sweats, Weakness Name of Substance 1: Opiates (Pain pills) 1 - Age of First Use: 20's 1 - Last Use / Amount: Today 01/12/15 Name of Substance 2: Anxiolytics (Xanax) 2 - Age of First Use: 20's 2 - Amount (size/oz): "Up to 4 pills a day" 2 - Frequency: Daily or when stressed 2 - Duration: Many years 2 - Last Use / Amount: Today, 01/12/15 Name of Substance 3: Heroin 3 - Age of First Use: 28 3 - Amount (size/oz): "$20 bag" 3 - Frequency: A few times per month when pt cannot locate pills 3 - Duration: 10 years 3 - Last Use / Amount: Unknown  Musculoskeletal: Strength & Muscle Tone: within normal limits Gait & Station: normal Patient leans: N/A  Psychiatric Specialty Exam:   Physical Exam  Vitals reviewed. Psychiatric: He has a normal mood and affect.    Review of Systems  Cardiovascular: Negative.   All other systems reviewed and are negative.   Blood pressure 112/75, pulse 113, temperature 97.9 F (36.6 C), temperature source Oral, resp. rate 16, height $RemoveBe'6\' 1"'qAyelKDed$  (1.854 m), weight 141.976 kg (313 lb).Body mass index is 41.3 kg/(m^2).   General Appearance: Casual  Eye Contact:: Minimal  Speech: Clear and Coherent  Volume: Decreased  Mood:  Anxious and Irritable  Affect: Constricted  Thought Process: Goal Directed  Orientation: Full (Time, Place, and Person)  Thought Content: Negative  Suicidal Thoughts: No  Homicidal Thoughts: No  Memory: Immediate; Fair Recent; Fair Remote; Fair  Judgement: Impaired  Insight: Lacking  Psychomotor Activity: Decreased  Concentration: Fair  Recall: [employer] of Knowledge:Fair  Language: Good  Akathisia: No  Handed: Right  AIMS (if indicated):    Assets: Communication Skills Desire for Improvement Physical Health  Sleep: Number of Hours: 4.75  Cognition: WNL  ADL's: Intact       Risk to Self: Is patient at risk for suicide?: Yes What has been your use of drugs/alcohol within the last 12 months?: Pt reports using alcohol, opiates and cocaine.  Risk to Others:   Prior Inpatient Therapy:   Prior Outpatient Therapy:    Alcohol Screening: 1. How often do you have a drink containing alcohol?: Never 2. How many drinks containing alcohol do you have on a typical day when you are drinking?: 1 or 2 3. How often do you have six or more drinks on one occasion?: Never Preliminary Score: 0 4. How often during the last year have you found that you were not  able to stop drinking once you had started?: Never 5. How often during the last year have you failed to do what was normally expected from you becasue of drinking?: Never 6. How often during the last year have you needed a first drink in the morning to get yourself going after a heavy drinking session?: Never 7. How often during the last year have you had a feeling of guilt of remorse after drinking?: Never 8. How often during the last year have you been unable to remember what happened the night before because you had been drinking?: Never 9. Have you or someone else been injured as a result of your drinking?: No 10. Has a relative or friend or a doctor or another health worker been concerned  about your drinking or suggested you cut down?: No Alcohol Use Disorder Identification Test Final Score (AUDIT): 0 Brief Intervention: AUDIT score less than 7 or less-screening does not suggest unhealthy drinking-brief intervention not indicated  Allergies:  No Known Allergies Lab Results:  Results for orders placed or performed during the hospital encounter of 01/12/15 (from the past 48 hour(s))  Urine Drug Screen     Status: Abnormal   Collection Time: 01/12/15  9:33 PM  Result Value Ref Range   Opiates POSITIVE (A) NONE DETECTED   Cocaine POSITIVE (A) NONE DETECTED   Benzodiazepines POSITIVE (A) NONE DETECTED   Amphetamines NONE DETECTED NONE DETECTED   Tetrahydrocannabinol NONE DETECTED NONE DETECTED   Barbiturates NONE DETECTED NONE DETECTED    Comment:        DRUG SCREEN FOR MEDICAL PURPOSES ONLY.  IF CONFIRMATION IS NEEDED FOR ANY PURPOSE, NOTIFY LAB WITHIN 5 DAYS.        LOWEST DETECTABLE LIMITS FOR URINE DRUG SCREEN Drug Class       Cutoff (ng/mL) Amphetamine      1000 Barbiturate      200 Benzodiazepine   370 Tricyclics       488 Opiates          300 Cocaine          300 THC              50   Acetaminophen level     Status: None   Collection Time: 01/12/15  9:36 PM  Result Value Ref Range   Acetaminophen (Tylenol), Serum 19.6 10 - 30 ug/mL    Comment:        THERAPEUTIC CONCENTRATIONS VARY SIGNIFICANTLY. A RANGE OF 10-30 ug/mL MAY BE AN EFFECTIVE CONCENTRATION FOR MANY PATIENTS. HOWEVER, SOME ARE BEST TREATED AT CONCENTRATIONS OUTSIDE THIS RANGE. ACETAMINOPHEN CONCENTRATIONS >150 ug/mL AT 4 HOURS AFTER INGESTION AND >50 ug/mL AT 12 HOURS AFTER INGESTION ARE OFTEN ASSOCIATED WITH TOXIC REACTIONS.   Ethanol (ETOH)     Status: None   Collection Time: 01/12/15  9:36 PM  Result Value Ref Range   Alcohol, Ethyl (B) <5 0 - 9 mg/dL    Comment:        LOWEST DETECTABLE LIMIT FOR SERUM ALCOHOL IS 11 mg/dL FOR MEDICAL PURPOSES ONLY   Salicylate level      Status: None   Collection Time: 01/12/15  9:36 PM  Result Value Ref Range   Salicylate Lvl <8.9 2.8 - 20.0 mg/dL  CBC     Status: Abnormal   Collection Time: 01/12/15  9:38 PM  Result Value Ref Range   WBC 6.7 4.0 - 10.5 K/uL   RBC 4.50 4.22 - 5.81 MIL/uL   Hemoglobin 12.5 (L)  13.0 - 17.0 g/dL   HCT 38.0 (L) 39.0 - 52.0 %   MCV 84.4 78.0 - 100.0 fL   MCH 27.8 26.0 - 34.0 pg   MCHC 32.9 30.0 - 36.0 g/dL   RDW 14.0 11.5 - 15.5 %   Platelets 205 150 - 400 K/uL  Comprehensive metabolic panel     Status: Abnormal   Collection Time: 01/12/15  9:38 PM  Result Value Ref Range   Sodium 140 135 - 145 mmol/L   Potassium 3.7 3.5 - 5.1 mmol/L   Chloride 106 96 - 112 mmol/L   CO2 26 19 - 32 mmol/L   Glucose, Bld 101 (H) 70 - 99 mg/dL   BUN 26 (H) 6 - 23 mg/dL   Creatinine, Ser 1.25 0.50 - 1.35 mg/dL   Calcium 9.1 8.4 - 10.5 mg/dL   Total Protein 7.3 6.0 - 8.3 g/dL   Albumin 4.2 3.5 - 5.2 g/dL   AST 18 0 - 37 U/L   ALT 19 0 - 53 U/L   Alkaline Phosphatase 77 39 - 117 U/L   Total Bilirubin 0.5 0.3 - 1.2 mg/dL   GFR calc non Af Amer 73 (L) >90 mL/min   GFR calc Af Amer 84 (L) >90 mL/min    Comment: (NOTE) The eGFR has been calculated using the CKD EPI equation. This calculation has not been validated in all clinical situations. eGFR's persistently <90 mL/min signify possible Chronic Kidney Disease.    Anion gap 8 5 - 15   Current Medications: Current Facility-Administered Medications  Medication Dose Route Frequency Provider Last Rate Last Dose  . acetaminophen (TYLENOL) tablet 650 mg  650 mg Oral Q4H PRN Laverle Hobby, PA-C      . albuterol (PROVENTIL HFA;VENTOLIN HFA) 108 (90 BASE) MCG/ACT inhaler 2 puff  2 puff Inhalation Q4H PRN Laverle Hobby, PA-C      . alum & mag hydroxide-simeth (MAALOX/MYLANTA) 200-200-20 MG/5ML suspension 30 mL  30 mL Oral Q4H PRN Laverle Hobby, PA-C      . cloNIDine (CATAPRES) tablet 0.1 mg  0.1 mg Oral QID Laverle Hobby, PA-C   0.1 mg at 01/13/15  1143   Followed by  . [START ON 01/15/2015] cloNIDine (CATAPRES) tablet 0.1 mg  0.1 mg Oral BH-qamhs Spencer E Simon, PA-C       Followed by  . [START ON 01/18/2015] cloNIDine (CATAPRES) tablet 0.1 mg  0.1 mg Oral QAC breakfast Laverle Hobby, PA-C      . dicyclomine (BENTYL) tablet 20 mg  20 mg Oral Q6H PRN Laverle Hobby, PA-C   20 mg at 01/13/15 1145  . hydrOXYzine (ATARAX/VISTARIL) tablet 25 mg  25 mg Oral Q6H PRN Laverle Hobby, PA-C   25 mg at 01/13/15 0016  . loperamide (IMODIUM) capsule 2-4 mg  2-4 mg Oral PRN Laverle Hobby, PA-C      . magnesium hydroxide (MILK OF MAGNESIA) suspension 30 mL  30 mL Oral Daily PRN Laverle Hobby, PA-C      . methocarbamol (ROBAXIN) tablet 500 mg  500 mg Oral Q8H PRN Laverle Hobby, PA-C   500 mg at 01/13/15 0016  . naproxen (NAPROSYN) tablet 500 mg  500 mg Oral BID PRN Laverle Hobby, PA-C   500 mg at 01/13/15 1145  . ondansetron (ZOFRAN-ODT) disintegrating tablet 4 mg  4 mg Oral Q6H PRN Laverle Hobby, PA-C   4 mg at 01/13/15 1145  . traZODone (DESYREL) tablet 50  mg  50 mg Oral QHS,MR X 1 Laverle Hobby, PA-C   50 mg at 01/13/15 1610   PTA Medications: Prescriptions prior to admission  Medication Sig Dispense Refill Last Dose  . acetaminophen (TYLENOL) 325 MG tablet Take 325-650 mg by mouth every 4 (four) hours as needed for mild pain, moderate pain or headache. Patient states he has taken at least 10-15 tylenol tablets today with no pain relief   Past Month at Unknown time  . albuterol (PROVENTIL HFA;VENTOLIN HFA) 108 (90 BASE) MCG/ACT inhaler Inhale 2 puffs into the lungs every 4 (four) hours as needed for wheezing or shortness of breath. 3 Inhaler 3 01/12/2015  . naproxen sodium (ANAPROX) 220 MG tablet Take 1,540 mg by mouth as needed (pain).   01/12/2015  . chlorpheniramine-HYDROcodone (TUSSIONEX PENNKINETIC ER) 10-8 MG/5ML LQCR Take 5 mLs by mouth every 12 (twelve) hours as needed for cough. (Patient not taking: Reported on 01/12/2015) 60 mL 0    . Fluticasone-Salmeterol (ADVAIR) 250-50 MCG/DOSE AEPB Inhale 1 puff into the lungs 2 (two) times daily. (Patient not taking: Reported on 01/12/2015) 60 each 0 08/09/2014 at Unknown time  . metoCLOPramide (REGLAN) 10 MG tablet Take 1 tablet (10 mg total) by mouth every 6 (six) hours as needed for nausea. (Patient not taking: Reported on 01/12/2015) 30 tablet 0   . predniSONE (DELTASONE) 20 MG tablet Take 3 tablets (60 mg total) by mouth daily. (Patient not taking: Reported on 01/12/2015) 15 tablet 0   . traMADol (ULTRAM) 50 MG tablet Take 1 tablet (50 mg total) by mouth every 6 (six) hours as needed. (Patient not taking: Reported on 01/12/2015) 15 tablet 0     Previous Psychotropic Medications: Yes   Substance Abuse History in the last 12 months:  Yes.      Consequences of Substance Abuse: NA  Results for orders placed or performed during the hospital encounter of 01/12/15 (from the past 72 hour(s))  Urine Drug Screen     Status: Abnormal   Collection Time: 01/12/15  9:33 PM  Result Value Ref Range   Opiates POSITIVE (A) NONE DETECTED   Cocaine POSITIVE (A) NONE DETECTED   Benzodiazepines POSITIVE (A) NONE DETECTED   Amphetamines NONE DETECTED NONE DETECTED   Tetrahydrocannabinol NONE DETECTED NONE DETECTED   Barbiturates NONE DETECTED NONE DETECTED    Comment:        DRUG SCREEN FOR MEDICAL PURPOSES ONLY.  IF CONFIRMATION IS NEEDED FOR ANY PURPOSE, NOTIFY LAB WITHIN 5 DAYS.        LOWEST DETECTABLE LIMITS FOR URINE DRUG SCREEN Drug Class       Cutoff (ng/mL) Amphetamine      1000 Barbiturate      200 Benzodiazepine   960 Tricyclics       454 Opiates          300 Cocaine          300 THC              50   Acetaminophen level     Status: None   Collection Time: 01/12/15  9:36 PM  Result Value Ref Range   Acetaminophen (Tylenol), Serum 19.6 10 - 30 ug/mL    Comment:        THERAPEUTIC CONCENTRATIONS VARY SIGNIFICANTLY. A RANGE OF 10-30 ug/mL MAY BE AN  EFFECTIVE CONCENTRATION FOR MANY PATIENTS. HOWEVER, SOME ARE BEST TREATED AT CONCENTRATIONS OUTSIDE THIS RANGE. ACETAMINOPHEN CONCENTRATIONS >150 ug/mL AT 4 HOURS AFTER INGESTION AND >50 ug/mL AT  12 HOURS AFTER INGESTION ARE OFTEN ASSOCIATED WITH TOXIC REACTIONS.   Ethanol (ETOH)     Status: None   Collection Time: 01/12/15  9:36 PM  Result Value Ref Range   Alcohol, Ethyl (B) <5 0 - 9 mg/dL    Comment:        LOWEST DETECTABLE LIMIT FOR SERUM ALCOHOL IS 11 mg/dL FOR MEDICAL PURPOSES ONLY   Salicylate level     Status: None   Collection Time: 01/12/15  9:36 PM  Result Value Ref Range   Salicylate Lvl <3.1 2.8 - 20.0 mg/dL  CBC     Status: Abnormal   Collection Time: 01/12/15  9:38 PM  Result Value Ref Range   WBC 6.7 4.0 - 10.5 K/uL   RBC 4.50 4.22 - 5.81 MIL/uL   Hemoglobin 12.5 (L) 13.0 - 17.0 g/dL   HCT 38.0 (L) 39.0 - 52.0 %   MCV 84.4 78.0 - 100.0 fL   MCH 27.8 26.0 - 34.0 pg   MCHC 32.9 30.0 - 36.0 g/dL   RDW 14.0 11.5 - 15.5 %   Platelets 205 150 - 400 K/uL  Comprehensive metabolic panel     Status: Abnormal   Collection Time: 01/12/15  9:38 PM  Result Value Ref Range   Sodium 140 135 - 145 mmol/L   Potassium 3.7 3.5 - 5.1 mmol/L   Chloride 106 96 - 112 mmol/L   CO2 26 19 - 32 mmol/L   Glucose, Bld 101 (H) 70 - 99 mg/dL   BUN 26 (H) 6 - 23 mg/dL   Creatinine, Ser 1.25 0.50 - 1.35 mg/dL   Calcium 9.1 8.4 - 10.5 mg/dL   Total Protein 7.3 6.0 - 8.3 g/dL   Albumin 4.2 3.5 - 5.2 g/dL   AST 18 0 - 37 U/L   ALT 19 0 - 53 U/L   Alkaline Phosphatase 77 39 - 117 U/L   Total Bilirubin 0.5 0.3 - 1.2 mg/dL   GFR calc non Af Amer 73 (L) >90 mL/min   GFR calc Af Amer 84 (L) >90 mL/min    Comment: (NOTE) The eGFR has been calculated using the CKD EPI equation. This calculation has not been validated in all clinical situations. eGFR's persistently <90 mL/min signify possible Chronic Kidney Disease.    Anion gap 8 5 - 15    Observation Level/Precautions:  15  minute checks  Laboratory:  per ED  Psychotherapy:  Group therapy  Medications:  As per medlist  Consultations:  As needed  Discharge Concerns:  safety  Estimated LOS:  5-7 days  Other:     Psychological Evaluations: No   Treatment Plan Summary: Daily contact with patient to assess and evaluate symptoms and progress in treatment and Medication management  Medical Decision Making:  Review of Psycho-Social Stressors (1), Discuss test with performing physician (1), Review and summation of old records (2), Review or order medicine tests (1) and Review of Medication Regimen & Side Effects (2)  I certify that inpatient services furnished can reasonably be expected to improve the patient's condition.   Freda Munro May Agustin AGNP-BC 3/31/20161:19 PM Patient seen face-to-face for psychiatric evaluation, chart reviewed and case discussed with the physician extender and developed treatment plan. Reviewed the information documented and agree with the treatment plan. Corena Pilgrim, MD

## 2015-01-13 NOTE — [facility] Counselor (Signed)
Adult Comprehensive Assessment  Patient ID: Danny Hale, male   DOB: [birth year], 39 y.o.   MRN: [id number]  Information Source: Information source: Patient  Current Stressors:  Educational / Learning stressors: None reported  Employment / Job issues: Currently employed  Family Relationships: Difficult family relationships due to his drug use. Financial / Lack of resources (include bankruptcy): None reported  Housing / Lack of housing: None reported  Physical health (include injuries & life threatening diseases): None reported  Social relationships: None reported  Substance abuse: Abuse opiates including heroin.  Bereavement / Loss: None reported   Living/Environment/Situation:  Living Arrangements: Spouse/significant other, Children Living conditions (as described by patient or guardian): "I like it sometimes."  How long has patient lived in current situation?: 3 years What is atmosphere in current home: Chaotic  Family History:  Marital status: Long term relationship Long term relationship, how long?: 3.5 years  What types of issues is patient dealing with in the relationship?: His drug use  Does patient have children?: Yes How many children?: 9 How is patient's relationship with their children?: 3 biological children, 6 step children. "Depends on the day"   Childhood History:  By whom was/is the patient raised?: Both parents Description of patient's relationship with caregiver when they were a child: Great relationship with mom but not so great with dad.  Patient's description of current relationship with people who raised him/her: His drug use has caused problems in the family.  Does patient have siblings?: Yes Number of Siblings: 1 Description of patient's current relationship with siblings: 1 sister; very close.  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  If attempted suicide, did drugs/alcohol play a role in this?: No Alcohol/Substance Abuse Treatment Hx: Past Tx, Inpatient, Past Tx, Outpatient, Past detox Has alcohol/substance abuse ever caused legal problems?: Yes (Drug charges )  Social Support System:   Patient's Community Support System: Fair Describe Community Support System: Famliy  Type of faith/religion: Christianity  How does patient's faith help to cope with current illness?: "I believe in God but I don't go to church."  Leisure/Recreation:   Leisure and Hobbies: "I don't really do anything."   Strengths/Needs:   What things does the patient do well?: "I don't know"  In what areas does patient struggle / problems for patient: Family relationships, anxiety, substance abuse   Discharge Plan:   Does patient have access to transportation?: Yes (Family or bus ) Will patient  be returning to same living situation after discharge?: No Plan for  living situation after discharge: Pt is going to an inpatient treatment program in [place].  Currently receiving community mental health services: No If no, would patient like referral for services when discharged?: Yes (What county?) Oncologist ) Does patient have financial barriers related to discharge medications?: Yes Patient description of barriers related to discharge medications: Limited income, no insurance   Summary/Recommendations:   Danny Hale is a 39 year old male who presented to [facility] for detox. He reports using opiates, benzodiazepines, cocaine and alcohol. He reports using "as much as I can get." He reports injecting heroin a few times. Pt lives with his fiance and children in [place], [place]. Currently, he does not receive outpatient services but would like a referral. Pt plans to go to an inpatient substance abuse program in [place] upon discharge. Recommendations include; crisis stabilization, medication management including Clonidine taper for detox.   Hyatt,Candace. 01/13/2015

## 2015-01-13 NOTE — Tx Team (Signed)
Initial Interdisciplinary Treatment Plan   PATIENT STRESSORS: Financial difficulties Substance abuse   PATIENT STRENGTHS: Ability for insight Average or above average intelligence Communication skills Motivation for treatment/growth Supportive family/friends   PROBLEM LIST: Problem List/Patient Goals Date to be addressed Date deferred Reason deferred Estimated date of resolution  Substance abuse "I want detox then rehab" 01/13/15   At d/c  depression 01/13/15   At d/c  Suicidal Ideation  01/13/15   At d/c                                       DISCHARGE CRITERIA:  Improved stabilization in mood, thinking, and/or behavior Motivation to continue treatment in a less acute level of care Need for constant or close observation no longer present Reduction of life-threatening or endangering symptoms to within safe limits Withdrawal symptoms are absent or subacute and managed without 24-hour nursing intervention  PRELIMINARY DISCHARGE PLAN: Return to previous living arrangement  PATIENT/FAMIILY INVOLVEMENT: This treatment plan has been presented to and reviewed with the patient, Danny Hale.  The patient and family have been given the opportunity to ask questions and make suggestions.  Apolinar Junes 01/13/2015, 12:59 AM

## 2015-01-13 NOTE — BHH Group Notes (Signed)
[facility] Group Notes: healthy life skills  Date:  01/13/2015  Time:  10:05 AM  Type of Therapy:  Nurse Education  Participation Level:  Did Not Attend  Participation Quality:  Inattentive  Affect:  Flat  Cognitive:  Lacking  Insight:  None  Engagement in Group:  None  Modes of Intervention:  Discussion  Summary of Progress/Problems:Pt slept and stayed in the bed.  Danny Hale 01/13/2015, 10:05 AM

## 2015-01-13 NOTE — Progress Notes (Signed)
Report received from admitting RN.  Introduced self to pt.  Pt reports withdrawal symptoms of anxiety, muscle spasms, tremor.  PRN medication administered for spasms, anxiety, sleep, see flow sheet.  Medication administered per order.  Oriented pt to unit.  Provided pt with beverage and encouraged PO fluid intake.  Provided pt with scrub pants per request.  Pt denies SI/HI, denies hallucinations.  Pt verbally contracts for safety.  Will continue to monitor and assess.

## 2015-01-13 NOTE — Progress Notes (Addendum)
Pt has been in the bed this am. He appears very disheleved and stated he needed to sleep; pt has not been attending am groups as he states he got in late last pm. He does contract for safety and denies SI and HI. Pt is pleasant. Pt c/o nausea and stomach cramping and body aches. He was medicated with bentyl, naprosyn and zofran. Pt did go to lunch and went outdoors with the other pts. Pts COWS is a 3 at 12 noon.

## 2015-01-13 NOTE — Progress Notes (Signed)
Pt presented to [facility] alert, anxious, irritable but cooperative.  Requesting detox from opiates and benzos, also +SI w/plan to shoot himself. Currently denies SI/HI, -A/[place] verbally contracts for safety. c/o stressors were finances and relationship with fianc. Reports symptoms of depression, insomnia, feeling hopeless and helpless. Pt c/o withdrawal symptoms (stomach cramps, chills, restlessness, body aches and anxiety) Receiving RN made aware. Emotional support and encouragement given. Pt admitted for evaluation, stabilization and reduction of baseline. Will monitor closely.

## 2015-01-13 NOTE — Tx Team (Signed)
Interdisciplinary Treatment Plan Update (Adult)   Date: 01/13/2015   Time Reviewed: 8:34 AM  Progress in Treatment:  Attending groups: new to unit.  Participating in groups:  New to unit.  Taking medication as prescribed: Yes  Tolerating medication: Yes  Family/Significant othe contact made: No  Patient understands diagnosis: Yes, AEB seeking treatment for SI with plan, Opiate/benzo detox, depression/mood instability, and for medication management.  Discussing patient identified problems/goals with staff: Yes  Medical problems stabilized or resolved: Yes  Denies suicidal/homicidal ideation: Passive SI/able to contract for safety on the unit.  Patient has not harmed self or Others: Yes  New problem(s) identified:  Discharge Plan or Barriers: CSW assessing for appropriate referrals at this time. Pt sleeping in room this morning. PSA needed.  Additional comments: Danny Hale is a 39 y.o., engaged, employed, Caucasian male presenting to [facility] as a walk-in requesting detox from opiates and benzos. Pt also presents with depression with SI with a plan to "get high and shoot myself". Pt presents with depressed mood and congruent affect, becoming tearful during assessment. Pt is open and cooperative, fair eye-contact, logical thought process, and linear speech pattern. Pt is engaged and states that his drug use is affecting his relationship with his fiancee and his ability to work, adding, "I'm going to lose my family and my home". Pt also has a hx of legal issues related to his SA. Pt states that he has accidentally overdosed three times in the past. Pt endorses depressive sx, including hopelessness, fatigue, insomnia, feelings of worthlessness, guilt, crying spells, anhedonia, irritability, social isolation, and suicidal thoughts. Pt reports thinking of various plans to kill himself, such as taking large doses of drugs and then shooting himself. Pt denies any access to weapons. He denies HI. He reports  a hx of auditory and visual hallucinations of "people who aren't really there". He states that he has these hallucinations even when not under the influence of substances.Pt reports daily use of prescription pain pills, regular use of anxiolytics (i.e. Xanax), and occasional use of heroin when pain pills cannot be located. Pt reports a hx of cocaine use as well but denies any current use of this drug. Pt denies any past suicide attempts. He reports two prior hospitalizations following (unintentional) overdoses of drugs - at [facility] and [facility]. Pt also states that he went to "mental health" about 3 years ago for treatment and was tried on various psychiatric medications, which he states did not work for him. Reason for Continuation of Hospitalization: Clonidine taper-withdrawals Medication stabilization Mood instability/depression SI Estimated length of stay: 3-5 days  For review of initial/current patient goals, please see plan of care.  Attendees:  Patient:    Family:    Physician: Corena Pilgrim MD 01/13/2015 8:37 AM   Nursing: Tommy Rainwater RN 01/13/2015 8:37 AM   Clinical Social Worker [place], [place]  01/13/2015 8:37 AM   Other: Adela Glimpse. LCSW 01/13/2015 8:37 AM   Other: Gerline Legacy Nurse CM 01/13/2015 8:37 AM   Other: Hilda Lias, Community Care Coordinator  01/13/2015 8:37 AM   Other:    Scribe for Treatment Team:  Maxie Better LCSWA 01/13/2015 8:37 AM

## 2015-01-13 NOTE — BHH Suicide Risk Assessment (Signed)
[facility] Admission Suicide Risk Assessment   Nursing information obtained from:  Patient Demographic factors:  Male, Caucasian Current Mental Status:  NA Loss Factors:  Financial problems / change in socioeconomic status Historical Factors:  Family history of mental illness or substance abuse Risk Reduction Factors:  Responsible for children under 37 years of age, Living with another person, especially a relative, Positive social support Total Time spent with patient: 30 minutes Principal Problem: Polysubstance dependence including opioid type drug, continuous use Diagnosis:   Patient Active Problem List   Diagnosis Date Noted  . Polysubstance dependence including opioid type drug, continuous use [F11.229] 01/13/2015    Priority: High  . Substance induced mood disorder [F19.94] 01/12/2015    Priority: High  . Chronic pain of right ankle [M25.571, G89.29] 05/20/2014  . Adjustment disorder with anxious mood [F43.22] 05/18/2014  . Asthma with acute exacerbation [J45.901] 05/18/2014  . Pain in joint, lower leg [M25.569] 05/18/2014  . Status asthmaticus with COPD (chronic obstructive pulmonary disease) [J44.9] 03/28/2014  . Asthma exacerbation [J45.901] 01/05/2013  . Status asthmaticus [J45.902] 01/05/2013     Continued Clinical Symptoms:  Alcohol Use Disorder Identification Test Final Score (AUDIT): 0 The "Alcohol Use Disorders Identification Test", Guidelines for Use in Primary Care, Second Edition.  World Pharmacologist [facility]). Score between 0-7:  no or low risk or alcohol related problems. Score between 8-15:  moderate risk of alcohol related problems. Score between 16-19:  high risk of alcohol related problems. Score 20 or above:  warrants further diagnostic evaluation for alcohol dependence and treatment.   CLINICAL FACTORS:   Severe Anxiety and/or Agitation Alcohol/Substance Abuse/Dependencies Medical Diagnoses and Treatments/Surgeries   Musculoskeletal: Strength & Muscle  Tone: within normal limits Gait & Station: normal Patient leans: N/A  Psychiatric Specialty Exam: Physical Exam  Psychiatric: His speech is normal. Thought content normal. His mood appears anxious. His affect is labile. He is agitated and aggressive. Cognition and memory are normal. He expresses impulsivity.    Review of Systems  Constitutional: Positive for chills, malaise/fatigue and diaphoresis.  Eyes: Negative.   Respiratory: Negative.   Cardiovascular: Negative.   Gastrointestinal: Positive for nausea and diarrhea.  Genitourinary: Negative.   Musculoskeletal: Positive for myalgias and joint pain.  Skin: Negative.   Neurological: Positive for weakness and headaches.  Endo/Heme/Allergies: Negative.   Psychiatric/Behavioral: Positive for substance abuse. The patient is nervous/anxious and has insomnia.     Blood pressure 112/75, pulse 113, temperature 97.9 F (36.6 C), temperature source Oral, resp. rate 16, height 6\' 1"  (1.854 m), weight 141.976 kg (313 lb).Body mass index is 41.3 kg/(m^2).  General Appearance: Casual  Eye Contact::  Minimal  Speech:  Clear and Coherent  Volume:  Decreased  Mood:  Anxious and Irritable  Affect:  Constricted  Thought Process:  Goal Directed  Orientation:  Full (Time, Place, and Person)  Thought Content:  Negative  Suicidal Thoughts:  No  Homicidal Thoughts:  No  Memory:  Immediate;   Fair Recent;   Fair Remote;   Fair  Judgement:  Impaired  Insight:  Lacking  Psychomotor Activity:  Decreased  Concentration:  Fair  Recall:  [employer] of Knowledge:Fair  Language: Good  Akathisia:  No  Handed:  Right  AIMS (if indicated):     Assets:  Communication Skills Desire for Improvement Physical Health  Sleep:  Number of Hours: 4.75  Cognition: WNL  ADL's:  Intact     COGNITIVE FEATURES THAT CONTRIBUTE TO RISK:  Closed-mindedness  SUICIDE RISK:   Minimal: No identifiable suicidal ideation.  Patients presenting with no risk factors  but with morbid ruminations; may be classified as minimal risk based on the severity of the depressive symptoms  PLAN OF CARE: 1. Admit for crisis management and stabilization. 2. Medication management to reduce current symptoms to base line and improve the patient's overall level of functioning 3. Treat health problems as indicated. 4. Develop treatment plan to decrease risk of relapse upon discharge and the need for  readmission. 5. Psycho-social education regarding relapse prevention and self care. 6. Health care follow up as needed for medical problems. 7. Restart home medications where appropriate.   Medical Decision Making:  Review or order clinical lab tests (1), Established Problem, Worsening (2), Review of Medication Regimen & Side Effects (2) and Review of New Medication or Change in Dosage (2)  I certify that inpatient services furnished can reasonably be expected to improve the patient's condition.   Corena Pilgrim, MD 01/13/2015, 10:54 AM

## 2015-01-14 MED ORDER — METHOCARBAMOL 750 MG PO TABS
750.0000 mg | ORAL_TABLET | Freq: Three times a day (TID) | ORAL | Status: AC | PRN
  Administered 2015-01-14 – 2015-01-16 (×6): 750 mg via ORAL
  Filled 2015-01-14 (×6): qty 1

## 2015-01-14 MED ORDER — LIDOCAINE 5 % EX PTCH
2.0000 | MEDICATED_PATCH | CUTANEOUS | Status: DC
  Administered 2015-01-14 – 2015-01-18 (×5): 2 via TRANSDERMAL
  Filled 2015-01-14: qty 2
  Filled 2015-01-14: qty 28
  Filled 2015-01-14 (×6): qty 2

## 2015-01-14 MED ORDER — GABAPENTIN 100 MG PO CAPS
200.0000 mg | ORAL_CAPSULE | Freq: Three times a day (TID) | ORAL | Status: DC
  Administered 2015-01-14 – 2015-01-18 (×11): 200 mg via ORAL
  Filled 2015-01-14 (×15): qty 2

## 2015-01-14 NOTE — Progress Notes (Signed)
[facility] MD Progress Note  01/14/2015 4:06 PM Danny Hale  MRN:  [id number] Subjective:  "I'm not doing so well.  Woke up and having one of my worst days.  I have bad back pain." Objective:  Danny Hale is a 39 y.o., engaged, employed, Caucasian male presenting to [facility] as a walk-in requesting detox from opiates and benzos. Pt also presents with depression with SI with a plan to "get high and shoot myself".  Pt is engaged and states that his drug use is affecting his relationship with his fiancee and his ability to work, adding, "I'm going to lose my family and my home". Pt also has a hx of legal issues related to his SA. Pt states that he has accidentally overdosed three times in the past.  Pt reports daily use of prescription pain pills, regular use of anxiolytics (i.e. Xanax), and occasional use of heroin when pain pills cannot be located.   Principal Problem: Polysubstance dependence including opioid type drug, continuous use Diagnosis:   Patient Active Problem List   Diagnosis Date Noted  . Polysubstance dependence including opioid type drug, continuous use [F11.229] 01/13/2015  . Substance induced mood disorder [F19.94] 01/12/2015  . Chronic pain of right ankle [M25.571, G89.29] 05/20/2014  . Adjustment disorder with anxious mood [F43.22] 05/18/2014  . Asthma with acute exacerbation [J45.901] 05/18/2014  . Pain in joint, lower leg [M25.569] 05/18/2014  . Status asthmaticus with COPD (chronic obstructive pulmonary disease) [J44.9] 03/28/2014  . Asthma exacerbation [J45.901] 01/05/2013  . Status asthmaticus [J45.902] 01/05/2013   Total Time spent with patient: 30 minutes   Past Medical History:  Past Medical History  Diagnosis Date  . Asthma   . Obesity   . COPD (chronic obstructive pulmonary disease)   . Emphysema   . Cigarette smoker     Past Surgical History  Procedure Laterality Date  . Ankle surgery     Family History:  Family History  Problem Relation Age of  Onset  . COPD Neg Hx   . Birth defects Sister    Social History:  History  Alcohol Use  . Yes    Comment: occ     History  Drug Use No    History   Social History  . Marital Status: Legally Separated    Spouse Name: N/A  . Number of Children: N/A  . Years of Education: N/A   Social History Main Topics  . Smoking status: Current Every Day Smoker -- 0.25 packs/day    Types: Cigarettes  . Smokeless tobacco: Never Used  . Alcohol Use: Yes     Comment: occ  . Drug Use: No  . Sexual Activity: Yes    Birth Control/ Protection: None   Other Topics Concern  . None   Social History Narrative   Additional History:    Sleep: Fair  Appetite:  Fair   Assessment:   Musculoskeletal: Strength & Muscle Tone: within normal limits Gait & Station: normal Patient leans: N/A   Psychiatric Specialty Exam: Physical Exam  Vitals reviewed.   ROS  Blood pressure 97/59, pulse 76, temperature 98.5 F (36.9 C), temperature source Oral, resp. rate 20, height $RemoveBe'6\' 1"'FEYBsSXjH$  (1.854 m), weight 141.976 kg (313 lb).Body mass index is 41.3 kg/(m^2).   General Appearance: Casual  Eye Contact:: Minimal  Speech: Clear and Coherent  Volume: Decreased  Mood: Anxious and Irritable  Affect: Constricted  Thought Process: Goal Directed  Orientation: Full (Time, Place, and Person)  Thought Content:  Negative  Suicidal Thoughts: No  Homicidal Thoughts: No  Memory: Immediate; Fair Recent; Fair Remote; Fair  Judgement: Impaired  Insight: Lacking  Psychomotor Activity: Decreased  Concentration: Fair  Recall: [employer] of Knowledge:Fair  Language: Good  Akathisia: No  Handed: Right  AIMS (if indicated):    Assets: Communication Skills Desire for Improvement Physical Health  Sleep: Number of Hours: 4.75  Cognition: WNL  ADL's: Intact       Current Medications: Current Facility-Administered Medications  Medication Dose Route  Frequency Provider Last Rate Last Dose  . acetaminophen (TYLENOL) tablet 650 mg  650 mg Oral Q4H PRN Laverle Hobby, PA-C   650 mg at 01/14/15 1217  . albuterol (PROVENTIL HFA;VENTOLIN HFA) 108 (90 BASE) MCG/ACT inhaler 2 puff  2 puff Inhalation Q4H PRN Laverle Hobby, PA-C      . alum & mag hydroxide-simeth (MAALOX/MYLANTA) 200-200-20 MG/5ML suspension 30 mL  30 mL Oral Q4H PRN Laverle Hobby, PA-C      . cloNIDine (CATAPRES) tablet 0.1 mg  0.1 mg Oral QID Laverle Hobby, PA-C   0.1 mg at 01/14/15 1218   Followed by  . [START ON 01/15/2015] cloNIDine (CATAPRES) tablet 0.1 mg  0.1 mg Oral BH-qamhs Spencer E Simon, PA-C       Followed by  . [START ON 01/18/2015] cloNIDine (CATAPRES) tablet 0.1 mg  0.1 mg Oral QAC breakfast Laverle Hobby, PA-C      . dicyclomine (BENTYL) tablet 20 mg  20 mg Oral Q6H PRN Laverle Hobby, PA-C   20 mg at 01/13/15 1145  . hydrOXYzine (ATARAX/VISTARIL) tablet 25 mg  25 mg Oral Q6H PRN Laverle Hobby, PA-C   25 mg at 01/14/15 1217  . loperamide (IMODIUM) capsule 2-4 mg  2-4 mg Oral PRN Laverle Hobby, PA-C      . magnesium hydroxide (MILK OF MAGNESIA) suspension 30 mL  30 mL Oral Daily PRN Laverle Hobby, PA-C      . methocarbamol (ROBAXIN) tablet 500 mg  500 mg Oral Q8H PRN Laverle Hobby, PA-C   500 mg at 01/14/15 4680  . naproxen (NAPROSYN) tablet 500 mg  500 mg Oral BID PRN Laverle Hobby, PA-C   500 mg at 01/13/15 1145  . nicotine (NICODERM CQ - dosed in mg/24 hours) patch 21 mg  21 mg Transdermal Daily Kerrie Buffalo, NP   21 mg at 01/14/15 0818  . ondansetron (ZOFRAN-ODT) disintegrating tablet 4 mg  4 mg Oral Q6H PRN Laverle Hobby, PA-C   4 mg at 01/13/15 1145  . traZODone (DESYREL) tablet 50 mg  50 mg Oral QHS,MR X 1 Laverle Hobby, PA-C   50 mg at 01/13/15 2304    Lab Results:  Results for orders placed or performed during the hospital encounter of 01/12/15 (from the past 48 hour(s))  Urine Drug Screen     Status: Abnormal   Collection Time:  01/12/15  9:33 PM  Result Value Ref Range   Opiates POSITIVE (A) NONE DETECTED   Cocaine POSITIVE (A) NONE DETECTED   Benzodiazepines POSITIVE (A) NONE DETECTED   Amphetamines NONE DETECTED NONE DETECTED   Tetrahydrocannabinol NONE DETECTED NONE DETECTED   Barbiturates NONE DETECTED NONE DETECTED    Comment:        DRUG SCREEN FOR MEDICAL PURPOSES ONLY.  IF CONFIRMATION IS NEEDED FOR ANY PURPOSE, NOTIFY LAB WITHIN 5 DAYS.        LOWEST DETECTABLE LIMITS FOR URINE DRUG  SCREEN Drug Class       Cutoff (ng/mL) Amphetamine      1000 Barbiturate      200 Benzodiazepine   572 Tricyclics       620 Opiates          300 Cocaine          300 THC              50   Acetaminophen level     Status: None   Collection Time: 01/12/15  9:36 PM  Result Value Ref Range   Acetaminophen (Tylenol), Serum 19.6 10 - 30 ug/mL    Comment:        THERAPEUTIC CONCENTRATIONS VARY SIGNIFICANTLY. A RANGE OF 10-30 ug/mL MAY BE AN EFFECTIVE CONCENTRATION FOR MANY PATIENTS. HOWEVER, SOME ARE BEST TREATED AT CONCENTRATIONS OUTSIDE THIS RANGE. ACETAMINOPHEN CONCENTRATIONS >150 ug/mL AT 4 HOURS AFTER INGESTION AND >50 ug/mL AT 12 HOURS AFTER INGESTION ARE OFTEN ASSOCIATED WITH TOXIC REACTIONS.   Ethanol (ETOH)     Status: None   Collection Time: 01/12/15  9:36 PM  Result Value Ref Range   Alcohol, Ethyl (B) <5 0 - 9 mg/dL    Comment:        LOWEST DETECTABLE LIMIT FOR SERUM ALCOHOL IS 11 mg/dL FOR MEDICAL PURPOSES ONLY   Salicylate level     Status: None   Collection Time: 01/12/15  9:36 PM  Result Value Ref Range   Salicylate Lvl <3.5 2.8 - 20.0 mg/dL  CBC     Status: Abnormal   Collection Time: 01/12/15  9:38 PM  Result Value Ref Range   WBC 6.7 4.0 - 10.5 K/uL   RBC 4.50 4.22 - 5.81 MIL/uL   Hemoglobin 12.5 (L) 13.0 - 17.0 g/dL   HCT 38.0 (L) 39.0 - 52.0 %   MCV 84.4 78.0 - 100.0 fL   MCH 27.8 26.0 - 34.0 pg   MCHC 32.9 30.0 - 36.0 g/dL   RDW 14.0 11.5 - 15.5 %   Platelets 205 150 -  400 K/uL  Comprehensive metabolic panel     Status: Abnormal   Collection Time: 01/12/15  9:38 PM  Result Value Ref Range   Sodium 140 135 - 145 mmol/L   Potassium 3.7 3.5 - 5.1 mmol/L   Chloride 106 96 - 112 mmol/L   CO2 26 19 - 32 mmol/L   Glucose, Bld 101 (H) 70 - 99 mg/dL   BUN 26 (H) 6 - 23 mg/dL   Creatinine, Ser 1.25 0.50 - 1.35 mg/dL   Calcium 9.1 8.4 - 10.5 mg/dL   Total Protein 7.3 6.0 - 8.3 g/dL   Albumin 4.2 3.5 - 5.2 g/dL   AST 18 0 - 37 U/L   ALT 19 0 - 53 U/L   Alkaline Phosphatase 77 39 - 117 U/L   Total Bilirubin 0.5 0.3 - 1.2 mg/dL   GFR calc non Af Amer 73 (L) >90 mL/min   GFR calc Af Amer 84 (L) >90 mL/min    Comment: (NOTE) The eGFR has been calculated using the CKD EPI equation. This calculation has not been validated in all clinical situations. eGFR's persistently <90 mL/min signify possible Chronic Kidney Disease.    Anion gap 8 5 - 15    Physical Findings: AIMS: Facial and Oral Movements Muscles of Facial Expression: None, normal Lips and Perioral Area: None, normal Jaw: None, normal Tongue: None, normal,Extremity Movements Upper (arms, wrists, hands, fingers): None, normal Lower (legs, knees, ankles, toes):  None, normal, Trunk Movements Neck, shoulders, hips: None, normal, Overall Severity Severity of abnormal movements (highest score from questions above): None, normal Incapacitation due to abnormal movements: None, normal Patient's awareness of abnormal movements (rate only patient's report): No Awareness, Dental Status Current problems with teeth and/or dentures?: No Does patient usually wear dentures?: No  CIWA:    COWS:  COWS Total Score: 2  Treatment Plan Summary: Review of chart, vital signs, medications, and notes.  1-Individual and group therapy  2-Medication management for depression and anxiety: Medications reviewed with the patient and she stated no untoward effects, unchanged. Added Lidoderm 2 patches for back pain. Increased  Robaxin to 750 mg and added Gabapentin 200 TID for numbness and tingling to bilat hands. 3-Coping skills for depression, anxiety  4-Continue crisis stabilization and management  5-Address health issues--monitoring vital signs, stable  6-Treatment plan in progress to prevent relapse of depression and anxiety  Medical Decision Making:  Review of Psycho-Social Stressors (1), Discuss test with performing physician (1), Decision to obtain old records (1), Review and summation of old records (2), Review or order medicine tests (1) and Review of Medication Regimen & Side Effects (2)  Sheila May [place] AGNP-BC 01/14/2015, 4:06 PM  Agree with progress note as above

## 2015-01-14 NOTE — Progress Notes (Signed)
D: Patient seen attending to his visitor. Calm and appropriate. Complained of chronic back pain of 8/10.  Denies SI, AH/VH. Rated anxiety 6/10, depression 6/10.  A: Support and encouragement offered to patient. Patient encouraged to continue with the treatment plan and verbalize needs to staff. Due medications given as ordered. Every 15 minutes check for safety maintained. Will continue to monitor patient for safety and stability.  R: Patient receptive to nursing interventions.

## 2015-01-14 NOTE — Progress Notes (Signed)
Recreation Therapy Notes  Date: 04.01.2016 Time: 9:30am Location: [address] Group Room   Group Topic: Stress Management  Goal Area(s) Addresses:  Patient will actively participate in stress management techniques presented during session.   Behavioral Response: Did not attend.   Laureen Ochs Roma Bondar, LRT/CTRS  Goodwin Kamphaus L 01/14/2015 1:01 PM

## 2015-01-14 NOTE — Clinical Social Work Note (Addendum)
CSW left message for admission dept at [facility] [place], [place]) [phone] in order to verify that pt has been accepted into program after detox. CSW requested that someone call back at their earliest convenience to touch base regarding pt and discuss any requirements prior to his admission.  [place], [place] 01/14/2015 11:42 AM   Tom called from [facility] and stated that he is searching for pt's information and will contact us back when this is located in order to discuss pt and referral process. Per Pt request, CSW referred pt to [facility] and [facility] as well.  [place], LCSWA 01/14/2015 3:28 PM

## 2015-01-14 NOTE — BHH Group Notes (Signed)
[facility] LCSW Group Therapy  01/14/2015 12:36 PM  Type of Therapy:  Group Therapy  Participation Level:  Active  Participation Quality:  Attentive  Affect:  Anxious and Depressed  Cognitive:  Alert and Oriented  Insight:  Limited  Engagement in Therapy:  Improving  Modes of Intervention:  Confrontation, Discussion, Education, Exploration, Problem-solving, Rapport Building, Socialization and Support  Summary of Progress/Problems: Feelings around Relapse. Group members discussed the meaning of relapse and shared personal stories of relapse, how it affected them and others, and how they perceived themselves during this time. Group members were encouraged to identify triggers, warning signs and coping skills used when facing the possibility of relapse. Social supports were discussed and explored in detail. Post Acute Withdrawal Syndrome (handout provided) was introduced and examined. Pt's were encouraged to ask questions, talk about key points associated with PAWS, and process this information in terms of relapse prevention. Haji was attentive and engaged during today's processing group. He shared that although he was thankful to learn about what to expect during recovery (PAWS), "I'm scared that I won't be strong enough to deal with this." Aum explored his feelings of anxiety and fear surrounding the risk of relapse, especially in early recovery, and was open to feedback and suggestions/advice from other group members. [name] shared that he needs to be in a structured, recovery-based environment "for at least 60-90 days" after detox in order to feel comfortable with managing his sobriety.    Smart, Sonya Pucci LCSWA 01/14/2015, 12:36 PM

## 2015-01-14 NOTE — [facility] Group Notes (Signed)
[facility] LCSW Aftercare Discharge Planning Group Note   01/14/2015 9:55 AM  Participation Quality:   Minimal   Mood/Affect:  Anxious   Depression Rating:  0  Anxiety Rating:  5  Thoughts of Suicide:  No Will you contract for safety?   NA  Current AVH:  No  Plan for Discharge/Comments:  Pt reports poor sleep last night "I think I got maybe 3 hours." Pt reports severe withdrawals and presents as anxious this morning. Pt reports that he thinks the name of the program he is going to [facility] d/c [facility] CSW assessing.   Transportation Means: unknown at this time.   Supports: fiance, some family supports   Proofreader, Research officer, trade union

## 2015-01-14 NOTE — [facility] Suicide Risk Assessment (Signed)
[place] INPATIENT:  Family/Significant Other Suicide Prevention Education  Suicide Prevention Education:  Education Completed; Danley Danker (pt's fiance) [phone] has been identified by the patient as the family member/significant other with whom the patient will be residing, and identified as the person(s) who will aid the patient in the event of a mental health crisis (suicidal ideations/suicide attempt).  With written consent from the patient, the family member/significant other has been provided the following suicide prevention education, prior to the and/or following the discharge of the patient.  The suicide prevention education provided includes the following:  Suicide risk factors  Suicide prevention and interventions  National Suicide Hotline telephone number  [facility] assessment telephone number  [facility] Emergency Assistance [place] and/or Residential Mobile Crisis Unit telephone number  Request made of family/significant other to:  Remove weapons (e.g., guns, rifles, knives), all items previously/currently identified as safety concern.    Remove drugs/medications (over-the-counter, prescriptions, illicit drugs), all items previously/currently identified as a safety concern.  The family member/significant other verbalizes understanding of the suicide prevention education information provided.  The family member/significant other agrees to remove the items of safety concern listed above.  Smart, Tagg Eustice LCSWA 01/14/2015, 3:53 PM

## 2015-01-14 NOTE — Progress Notes (Signed)
Patient ID: Danny Hale, male   DOB: Jan 16, 1976, 39 y.o.   MRN: [id number]  D: Patient with med-seeking behaviors and multiple somatic complaints. Patient interacting well with peers and laughing, but at the same time c/o anxiety. Information incongruent with reported mood. Pt also approaching desk asking for another prn sleeping pill, but at the same time, his eyes are also closing while talking. A: Q 15 minute safety checks, encourage staff/peer interaction, group participation and administer medications as ordered. R: Patient denies SI or plans to harm himself. No s/s of distress noted during shift.

## 2015-01-15 ENCOUNTER — Encounter (HOSPITAL_COMMUNITY): Payer: Self-pay | Admitting: Registered Nurse

## 2015-01-15 MED ORDER — TRAZODONE HCL 100 MG PO TABS
100.0000 mg | ORAL_TABLET | Freq: Every evening | ORAL | Status: DC | PRN
  Administered 2015-01-15 – 2015-01-16 (×2): 100 mg via ORAL
  Filled 2015-01-15: qty 1

## 2015-01-15 NOTE — Progress Notes (Signed)
Patient did attend the evening speaker AA meeting.  

## 2015-01-15 NOTE — [facility] Group Notes (Signed)
[place] Group Notes:  (Nursing/MHT/Case Management/Adjunct)  Date:  01/15/2015  Time:  0930am  Type of Therapy:  Nurse Education  Participation Level:  Active  Participation Quality:  Appropriate and Attentive  Affect:  Blunted  Cognitive:  Alert and Appropriate  Insight:  Appropriate and Good  Engagement in Group:  Engaged  Modes of Intervention:  Discussion, Education and Support  Summary of Progress/Problems: Patient attended group, remained engaged and responded appropriately when prompted. Pt states his goal for the day is "staying positive, talk to someone and get my emotions out."  Jimmye Norman, [place] A 01/15/2015, 12:32 PM

## 2015-01-15 NOTE — Progress Notes (Signed)
[facility] MD Progress Note  01/15/2015 1:40 PM Danny Hale  MRN:  [id number]   Subjective: Patient states "I don't feel so hot; I got a lot going on; just worrying about everything; hurting from the withdrawal; I would feel better if I knew I had a place to go.  I need a 90 day treatment; cause I know what is going to happen if I have to go home.  I wish I could find somewhere I could go so I could go the day I was discharged here.."  Patient continues to endorse depression and back pain     Objective:  Patient seen; chart reviewed; and discussed with nursing staff.  Patient is tolerating medications without adverse reaction and participating in group sessions.     Principal Problem: Polysubstance dependence including opioid type drug, continuous use Diagnosis:   Patient Active Problem List   Diagnosis Date Noted  . Polysubstance dependence including opioid type drug, continuous use [F11.229] 01/13/2015  . Substance induced mood disorder [F19.94] 01/12/2015  . Chronic pain of right ankle [M25.571, G89.29] 05/20/2014  . Adjustment disorder with anxious mood [F43.22] 05/18/2014  . Asthma with acute exacerbation [J45.901] 05/18/2014  . Pain in joint, lower leg [M25.569] 05/18/2014  . Status asthmaticus with COPD (chronic obstructive pulmonary disease) [J44.9] 03/28/2014  . Asthma exacerbation [J45.901] 01/05/2013  . Status asthmaticus [J45.902] 01/05/2013   Total Time spent with patient: 30 minutes   Past Medical History:  Past Medical History  Diagnosis Date  . Asthma   . Obesity   . COPD (chronic obstructive pulmonary disease)   . Emphysema   . Cigarette smoker     Past Surgical History  Procedure Laterality Date  . Ankle surgery     Family History:  Family History  Problem Relation Age of Onset  . COPD Neg Hx   . Birth defects Sister    Social History:  History  Alcohol Use  . Yes    Comment: occ     History  Drug Use No    History   Social History  .  Marital Status: Legally Separated    Spouse Name: N/A  . Number of Children: N/A  . Years of Education: N/A   Social History Main Topics  . Smoking status: Current Every Day Smoker -- 0.25 packs/day    Types: Cigarettes  . Smokeless tobacco: Never Used  . Alcohol Use: Yes     Comment: occ  . Drug Use: No  . Sexual Activity: Yes    Birth Control/ Protection: None   Other Topics Concern  . None   Social History Narrative   Additional History:    Sleep: Fair  Appetite:  Fair   Assessment:   Musculoskeletal: Strength & Muscle Tone: within normal limits Gait & Station: normal Patient leans: N/A   Psychiatric Specialty Exam: Physical Exam  Vitals reviewed. Constitutional: He is oriented to person, place, and time.  Neck: Normal range of motion.  Respiratory: Effort normal.  Musculoskeletal: Normal range of motion.  Neurological: He is alert and oriented to person, place, and time.  Psychiatric: His behavior is normal. His mood appears anxious. Cognition and memory are normal. He exhibits a depressed mood. Suicidal: Passive.    Review of Systems  Psychiatric/Behavioral: Positive for depression (Rates 3/10), suicidal ideas (Passive no intention/plan) and substance abuse. Negative for hallucinations (Denies) and memory loss. The patient is nervous/anxious (Rates 7/10) and has insomnia (states that sleeping medication is not working).  Blood pressure 118/67, pulse 71, temperature 97.7 F (36.5 C), temperature source Oral, resp. rate 16, height 6\' 1"  (1.854 m), weight 141.976 kg (313 lb).Body mass index is 41.3 kg/(m^2).   General Appearance: Casual  Eye Contact:: Minimal  Speech: Clear and Coherent  Volume: Decreased  Mood: Anxious and Irritable  Affect: Constricted  Thought Process: Goal Directed  Orientation: Full (Time, Place, and Person)  Thought Content: Negative  Suicidal Thoughts: Passive  Homicidal Thoughts: No  Memory: Immediate;  Fair Recent; Fair Remote; Fair  Judgement: Impaired  Insight: Lacking  Psychomotor Activity: Decreased  Concentration: Fair  Recall: [employer] of Knowledge:Fair  Language: Good  Akathisia: No  Handed: Right  AIMS (if indicated):    Assets: Communication Skills Desire for Improvement Physical Health  Sleep: Number of Hours: 4.75  Cognition: WNL  ADL's: Intact       Current Medications: Current Facility-Administered Medications  Medication Dose Route Frequency Provider Last Rate Last Dose  . acetaminophen (TYLENOL) tablet 650 mg  650 mg Oral Q4H PRN Laverle Hobby, PA-C   650 mg at 01/14/15 1217  . albuterol (PROVENTIL HFA;VENTOLIN HFA) 108 (90 BASE) MCG/ACT inhaler 2 puff  2 puff Inhalation Q4H PRN Laverle Hobby, PA-C      . alum & mag hydroxide-simeth (MAALOX/MYLANTA) 200-200-20 MG/5ML suspension 30 mL  30 mL Oral Q4H PRN Laverle Hobby, PA-C      . cloNIDine (CATAPRES) tablet 0.1 mg  0.1 mg Oral BH-qamhs Laverle Hobby, PA-C       Followed by  . [START ON 01/18/2015] cloNIDine (CATAPRES) tablet 0.1 mg  0.1 mg Oral QAC breakfast Laverle Hobby, PA-C      . dicyclomine (BENTYL) tablet 20 mg  20 mg Oral Q6H PRN Laverle Hobby, PA-C   20 mg at 01/13/15 1145  . gabapentin (NEURONTIN) capsule 200 mg  200 mg Oral TID Kerrie Buffalo, NP   200 mg at 01/15/15 1147  . hydrOXYzine (ATARAX/VISTARIL) tablet 25 mg  25 mg Oral Q6H PRN Laverle Hobby, PA-C   25 mg at 01/15/15 0753  . lidocaine (LIDODERM) 5 % 2 patch  2 patch Transdermal Q24H Kerrie Buffalo, NP   2 patch at 01/14/15 1715  . loperamide (IMODIUM) capsule 2-4 mg  2-4 mg Oral PRN Laverle Hobby, PA-C      . magnesium hydroxide (MILK OF MAGNESIA) suspension 30 mL  30 mL Oral Daily PRN Laverle Hobby, PA-C      . methocarbamol (ROBAXIN) tablet 750 mg  750 mg Oral Q8H PRN Kerrie Buffalo, NP   750 mg at 01/15/15 0753  . naproxen (NAPROSYN) tablet 500 mg  500 mg Oral BID PRN Laverle Hobby, PA-C    500 mg at 01/15/15 0753  . nicotine (NICODERM CQ - dosed in mg/24 hours) patch 21 mg  21 mg Transdermal Daily Kerrie Buffalo, NP   21 mg at 01/15/15 0754  . ondansetron (ZOFRAN-ODT) disintegrating tablet 4 mg  4 mg Oral Q6H PRN Laverle Hobby, PA-C   4 mg at 01/13/15 1145  . traZODone (DESYREL) tablet 50 mg  50 mg Oral QHS,MR X 1 Laverle Hobby, PA-C   50 mg at 01/14/15 2233    Lab Results:  No results found for this or any previous visit (from the past 48 hour(s)).  Physical Findings: AIMS: Facial and Oral Movements Muscles of Facial Expression: None, normal Lips and Perioral Area: None, normal Jaw: None, normal Tongue: None, normal,Extremity  Movements Upper (arms, wrists, hands, fingers): None, normal Lower (legs, knees, ankles, toes): None, normal, Trunk Movements Neck, shoulders, hips: None, normal, Overall Severity Severity of abnormal movements (highest score from questions above): None, normal Incapacitation due to abnormal movements: None, normal Patient's awareness of abnormal movements (rate only patient's report): No Awareness, Dental Status Current problems with teeth and/or dentures?: No Does patient usually wear dentures?: No  CIWA:    COWS:  COWS Total Score: 3  Treatment Plan Summary: Review of chart, vital signs, medications, and notes.  1-Individual and group therapy  2-Medication management for depression and anxiety: Medications reviewed with the patient and she stated no untoward effects, unchanged. Added Lidoderm 2 patches for back pain. Increased Robaxin to 750 mg and added Gabapentin 200 TID for numbness and tingling to bilat hands. 3-Coping skills for depression, anxiety  4-Continue crisis stabilization and management  5-Address health issues--monitoring vital signs, stable  6-Treatment plan in progress to prevent relapse of depression and anxiety   Will continue with current treatment plan.  Increased Trazodone to 100 mg Q hs prn sleep  Medical  Decision Making:  Review of Psycho-Social Stressors (1), Discuss test with performing physician (1), Decision to obtain old records (1), Review and summation of old records (2), Review or order medicine tests (1) and Review of Medication Regimen & Side Effects (2)  Rankin, Shuvon FNP-BC 01/15/2015, 1:40 PM

## 2015-01-15 NOTE — Progress Notes (Signed)
D: Patient seen on day room with his visitor watching TV. Patient denies pain, but requested the "750 mg medication" which is Robaxin. Denies SI, AH/VH. Endorses mild anxiety 4/10. Accepted vistaril 25 mg for anxiety as ordered.  A: support and encouragement offered. Patient encouraged to continue with the treatment plan and verbalize needs to staff. Due med given as ordered. Every 15 minutes check for safety maintained. Will continue to monitor patient for stability. R: Patient is safe.

## 2015-01-15 NOTE — Plan of Care (Signed)
Problem: Ineffective individual coping Goal: STG: Patient will remain free from self harm Outcome: Progressing Patient remains free from self harm. 15 minute checks continued per protocol for patient safety.   Problem: Diagnosis: Increased Risk For Suicide Attempt Goal: STG-Patient Will Attend All Groups On The Unit Outcome: Progressing Patient is attending unit groups today. Goal: STG-Patient Will Comply With Medication Regime Outcome: Progressing Patient has adhered to medication regimen today with ease.  Problem: Alteration in mood & ability to function due to Goal: LTG-Pt reports reduction in suicidal thoughts (Patient reports reduction in suicidal thoughts and is able to verbalize a safety plan for whenever patient is feeling suicidal)  Outcome: Progressing Patient denies having any suicidal thoughts today.

## 2015-01-15 NOTE — Progress Notes (Signed)
D: Patient is alert and oriented. Pt's mood and affect is depressed and blunted/flat. Pt denies SI/HI and AVH. Pt rates depression and hopelessness 3/10 and anxiety 7/10. Pt reports his goal for the day is "not give up on myself because of my thoughts." Pt reports he will attempt to "stay positive and talk to people." Pt complains of withdrawal symptoms including anxiety and "achy all over." Pt complains of chronic lower back pain 8/10. Pt is attending unit groups. A: Active listening by RN. Encouragement/Support provided to pt. Medication education reviewed with pt. PRN medication administered for anxiety, pain, and muscles spasms per providers orders (See MAR). Scheduled medications administered per providers orders (See MAR). 15 minute checks continued per protocol for patient safety.  R: Patient cooperative and receptive to nursing interventions. Pt remains safe.

## 2015-01-15 NOTE — B[facility] Group Notes (Signed)
[place] Group Notes:  (Clinical Social Work)  01/15/2015   1:15-2:15PM  Summary of Progress/Problems:   The main focus of today's process group was for the patient to identify ways in which they have sabotaged their own mental health wellness/recovery.  Motivational interviewing and a handout were used to explore the benefits and costs of their self-sabotaging behavior as well as the benefits and costs of changing this behavior.  The Stages of Change were explained to the group using a handout, and patients identified where they are with regard to changing self-defeating behaviors.  The patient expressed he self-sabotages with drugs and alcohol, as well as procrastination.  He talked at length throughout group about how he puts other people first all the time, then insightfully stated that he actually must put himself first when he uses drugs/alcohol.  He stated it scares him to talk with people during AA/NA who have been clean for 10 years or 20 years, then relapsed.  Type of Therapy:  Process Group  Participation Level:  Active  Participation Quality:  Attentive and Sharing  Affect:  Flat  Cognitive:  Alert  Insight:  Developing/Improving  Engagement in Therapy:  Engaged  Modes of Intervention:  Education, Motivational Interviewing   Selmer Dominion, LCSW 01/15/2015, 4:00pm

## 2015-01-16 MED ORDER — BUSPIRONE HCL 5 MG PO TABS
5.0000 mg | ORAL_TABLET | Freq: Two times a day (BID) | ORAL | Status: DC
  Administered 2015-01-16 – 2015-01-18 (×4): 5 mg via ORAL
  Filled 2015-01-16 (×8): qty 1

## 2015-01-16 MED ORDER — MIRTAZAPINE 15 MG PO TABS
15.0000 mg | ORAL_TABLET | Freq: Every day | ORAL | Status: DC
  Administered 2015-01-16 – 2015-01-19 (×4): 15 mg via ORAL
  Filled 2015-01-16 (×5): qty 1
  Filled 2015-01-16: qty 14

## 2015-01-16 NOTE — B[facility] Group Notes (Signed)
[place] Group Notes: healthy support system  Date:  01/16/2015  Time:  10:19 AM  Type of Therapy:  Nurse Education  Participation Level:  Active  Participation Quality:  Appropriate  Affect:  Appropriate  Cognitive:  Appropriate  Insight:  Appropriate  Engagement in Group:  Engaged  Modes of Intervention:  Discussion  Summary of Progress/Problems:Pt stated his three pillars are: god, his children and fiancee  Giannis, [place] 01/16/2015, 10:19 AM

## 2015-01-16 NOTE — BHH Group Notes (Signed)
[place] Group Notes: (Clinical Social Work)  01/16/2015 1:15-2:00PM  Summary of Progress/Problems: The main focus of today's process group was to  1) discuss the importance of adding supports 2) define healthy supports versus unhealthy supports 3) identify the patient's current healthy supports and brainstorm what to add 4) discuss self-support and how difficult it is to support oneself, helping patients to understand the difficulties family members face with this issue.  Motivational Interviewing was used, particularly rolling with resistance as most of the group was quite resistant to change.  The patient stated that he wants his mother to understand why he drinks, does not know what to say to his 36yo son about why he drinks.  As the discussion ended after 1 hour, he stated that his conclusion after the discussion is that the problem is that he does not understand himself what his reasons are for drinking.  During group, he stated if there was an entire table full of opiates or heroin, he would use every last bit of it, regardless of knowing how horrible he would feel afterward.  Type of Therapy: Process Group with Motivational Interviewing  Participation Level: Active  Participation Quality: Attentive and Sharing  Affect: Blunted and Depressed  Cognitive: Appropriate and Oriented  Insight: Engaged  Engagement in Therapy: Engaged  Modes of Intervention: Motivational Interviewing, [employer], LCSW 01/16/2015, 3:15pm

## 2015-01-16 NOTE — Progress Notes (Signed)
Pt appears blunted and depressed. He has been attending groups and has been partcipating. He does contract for safety and denies Si and HI. Pt stated he needs more pain medication. NP made aware.

## 2015-01-16 NOTE — Progress Notes (Signed)
Patient did attend the evening speaker AA meeting.  

## 2015-01-16 NOTE — Progress Notes (Signed)
[facility] MD Progress Note  01/16/2015 11:38 AM Reon Hunley  MRN:  [id number]   Subjective: "Doc I'm feeling really depressed and emotional.  Just being away from my kids." Patient states that he is still not sleeping and no improvement with sleep.  Patient states that he is easily agitated and anxious and nothing is working.     Objective:  Patient seen; chart reviewed; and discussed with nursing staff.  Continues to be depressed and anxious.  Patient has flat affect.  Patient is tolerating medications without adverse reaction and participating in group sessions.     Principal Problem: Polysubstance dependence including opioid type drug, continuous use Diagnosis:   Patient Active Problem List   Diagnosis Date Noted  . Polysubstance dependence including opioid type drug, continuous use [F11.229] 01/13/2015  . Substance induced mood disorder [F19.94] 01/12/2015  . Chronic pain of right ankle [M25.571, G89.29] 05/20/2014  . Adjustment disorder with anxious mood [F43.22] 05/18/2014  . Asthma with acute exacerbation [J45.901] 05/18/2014  . Pain in joint, lower leg [M25.569] 05/18/2014  . Status asthmaticus with COPD (chronic obstructive pulmonary disease) [J44.9] 03/28/2014  . Asthma exacerbation [J45.901] 01/05/2013  . Status asthmaticus [J45.902] 01/05/2013   Total Time spent with patient: 30 minutes   Past Medical History:  Past Medical History  Diagnosis Date  . Asthma   . Obesity   . COPD (chronic obstructive pulmonary disease)   . Emphysema   . Cigarette smoker     Past Surgical History  Procedure Laterality Date  . Ankle surgery     Family History:  Family History  Problem Relation Age of Onset  . COPD Neg Hx   . Birth defects Sister    Social History:  History  Alcohol Use  . Yes    Comment: occ     History  Drug Use No    History   Social History  . Marital Status: Legally Separated    Spouse Name: N/A  . Number of Children: N/A  . Years of  Education: N/A   Social History Main Topics  . Smoking status: Current Every Day Smoker -- 0.25 packs/day    Types: Cigarettes  . Smokeless tobacco: Never Used  . Alcohol Use: Yes     Comment: occ  . Drug Use: No  . Sexual Activity: Yes    Birth Control/ Protection: None   Other Topics Concern  . None   Social History Narrative   Additional History:    Sleep: Poor  Appetite:  Good   Assessment:   Musculoskeletal: Strength & Muscle Tone: within normal limits Gait & Station: normal Patient leans: N/A   Psychiatric Specialty Exam: Physical Exam  Vitals reviewed. Constitutional: He is oriented to person, place, and time.  Neck: Normal range of motion.  Respiratory: Effort normal.  Musculoskeletal: Normal range of motion.  Neurological: He is alert and oriented to person, place, and time.  Psychiatric: His behavior is normal. His mood appears anxious. Cognition and memory are normal. He exhibits a depressed mood. Suicidal: Passive.    ROS  Blood pressure 117/63, pulse 71, temperature 97.7 F (36.5 C), temperature source Oral, resp. rate 16, height 6\' 1"  (1.854 m), weight 141.976 kg (313 lb).Body mass index is 41.3 kg/(m^2).   General Appearance: Casual  Eye Contact:: Minimal  Speech: Clear and Coherent  Volume: Decreased  Mood: Anxious, Depressed, and Irritable  Affect: Flat  Thought Process: Goal Directed  Orientation: Full (Time, Place, and Person)  Thought Content:  Negative  Suicidal Thoughts: Passive  Homicidal Thoughts: No  Memory: Immediate; Fair Recent; Fair Remote; Fair  Judgement: Impaired  Insight: Lacking  Psychomotor Activity: Decreased  Concentration: Fair  Recall: [employer] of Knowledge:Fair  Language: Good  Akathisia: No  Handed: Right  AIMS (if indicated):    Assets: Communication Skills Desire for Improvement Physical Health  Sleep: Number of Hours: 4.75  Cognition: WNL  ADL's:  Intact       Current Medications: Current Facility-Administered Medications  Medication Dose Route Frequency Provider Last Rate Last Dose  . acetaminophen (TYLENOL) tablet 650 mg  650 mg Oral Q4H PRN Laverle Hobby, PA-C   650 mg at 01/15/15 1432  . albuterol (PROVENTIL HFA;VENTOLIN HFA) 108 (90 BASE) MCG/ACT inhaler 2 puff  2 puff Inhalation Q4H PRN Laverle Hobby, PA-C      . alum & mag hydroxide-simeth (MAALOX/MYLANTA) 200-200-20 MG/5ML suspension 30 mL  30 mL Oral Q4H PRN Laverle Hobby, PA-C      . cloNIDine (CATAPRES) tablet 0.1 mg  0.1 mg Oral BH-qamhs Laverle Hobby, PA-C   0.1 mg at 01/15/15 2113   Followed by  . [START ON 01/18/2015] cloNIDine (CATAPRES) tablet 0.1 mg  0.1 mg Oral QAC breakfast Laverle Hobby, PA-C   0.1 mg at 01/16/15 0912  . dicyclomine (BENTYL) tablet 20 mg  20 mg Oral Q6H PRN Laverle Hobby, PA-C   20 mg at 01/13/15 1145  . gabapentin (NEURONTIN) capsule 200 mg  200 mg Oral TID Kerrie Buffalo, NP   200 mg at 01/16/15 0912  . hydrOXYzine (ATARAX/VISTARIL) tablet 25 mg  25 mg Oral Q6H PRN Laverle Hobby, PA-C   25 mg at 01/15/15 2120  . lidocaine (LIDODERM) 5 % 2 patch  2 patch Transdermal Q24H Kerrie Buffalo, NP   2 patch at 01/15/15 1637  . loperamide (IMODIUM) capsule 2-4 mg  2-4 mg Oral PRN Laverle Hobby, PA-C      . magnesium hydroxide (MILK OF MAGNESIA) suspension 30 mL  30 mL Oral Daily PRN Laverle Hobby, PA-C      . methocarbamol (ROBAXIN) tablet 750 mg  750 mg Oral Q8H PRN Kerrie Buffalo, NP   750 mg at 01/15/15 2111  . naproxen (NAPROSYN) tablet 500 mg  500 mg Oral BID PRN Laverle Hobby, PA-C   500 mg at 01/15/15 0753  . nicotine (NICODERM CQ - dosed in mg/24 hours) patch 21 mg  21 mg Transdermal Daily Kerrie Buffalo, NP   21 mg at 01/16/15 0815  . ondansetron (ZOFRAN-ODT) disintegrating tablet 4 mg  4 mg Oral Q6H PRN Laverle Hobby, PA-C   4 mg at 01/13/15 1145  . traZODone (DESYREL) tablet 100 mg  100 mg Oral QHS PRN Shuvon B Rankin, NP    100 mg at 01/15/15 2112    Lab Results:  No results found for this or any previous visit (from the past 48 hour(s)).  Physical Findings: AIMS: Facial and Oral Movements Muscles of Facial Expression: None, normal Lips and Perioral Area: None, normal Jaw: None, normal Tongue: None, normal,Extremity Movements Upper (arms, wrists, hands, fingers): None, normal Lower (legs, knees, ankles, toes): None, normal, Trunk Movements Neck, shoulders, hips: None, normal, Overall Severity Severity of abnormal movements (highest score from questions above): None, normal Incapacitation due to abnormal movements: None, normal Patient's awareness of abnormal movements (rate only patient's report): No Awareness, Dental Status Current problems with teeth and/or dentures?: No Does patient usually wear  dentures?: No  CIWA:    COWS:  COWS Total Score: 2  Treatment Plan Summary: Review of chart, vital signs, medications, and notes.  1-Individual and group therapy  2-Medication management for depression and anxiety: Medications reviewed with the patient and she stated no untoward effects, unchanged. Added Lidoderm 2 patches for back pain. Increased Robaxin to 750 mg and added Gabapentin 200 TID for numbness and tingling to bilat hands. 3-Coping skills for depression, anxiety  4-Continue crisis stabilization and management  5-Address health issues--monitoring vital signs, stable  6-Treatment plan in progress to prevent relapse of depression and anxiety  Changes 01/15/2015:   Increased Trazodone to 100 mg Q hs prn sleep  Will continue with current treatment plan except for following changes:      Start Remeron 15 mg Q hs for depression and insomnia Start Buspar 5 mg Bid for anxiety.  May increase in two days if needed  Medical Decision Making:  Review of Psycho-Social Stressors (1), Discuss test with performing physician (1), Decision to obtain old records (1), Review and summation of old records (2), Review  or order medicine tests (1), Review of Medication Regimen & Side Effects (2) and Review of New Medication or Change in Dosage (2)  Rankin, Shuvon FNP-BC 01/16/2015, 11:38 AM

## 2015-01-17 DIAGNOSIS — F11229 Opioid dependence with intoxication, unspecified: Secondary | ICD-10-CM

## 2015-01-17 MED ORDER — TRAZODONE HCL 100 MG PO TABS
200.0000 mg | ORAL_TABLET | Freq: Every evening | ORAL | Status: DC | PRN
Start: 2015-01-17 — End: 2015-01-19
  Administered 2015-01-17 – 2015-01-18 (×2): 200 mg via ORAL
  Filled 2015-01-17 (×2): qty 2

## 2015-01-17 MED ORDER — SUMATRIPTAN SUCCINATE 25 MG PO TABS
25.0000 mg | ORAL_TABLET | Freq: Four times a day (QID) | ORAL | Status: DC | PRN
Start: 2015-01-17 — End: 2015-01-20
  Administered 2015-01-17 – 2015-01-20 (×2): 25 mg via ORAL
  Filled 2015-01-17: qty 1
  Filled 2015-01-17: qty 5
  Filled 2015-01-17: qty 1

## 2015-01-17 MED ORDER — IBUPROFEN 600 MG PO TABS
600.0000 mg | ORAL_TABLET | Freq: Four times a day (QID) | ORAL | Status: DC | PRN
  Administered 2015-01-17 – 2015-01-19 (×4): 600 mg via ORAL
  Filled 2015-01-17 (×4): qty 1

## 2015-01-17 NOTE — Progress Notes (Signed)
Recreation Therapy Notes  Date: 04.04.2016 Time: 9:30am Location: [address] Group Room   Group Topic: Stress Management  Goal Area(s) Addresses:  Patient will actively participate in stress management techniques presented during session.   Behavioral Response: Did not attend.   Laureen Ochs Camielle Sizer, LRT/CTRS  Marian Meneely L 01/17/2015 3:07 PM

## 2015-01-17 NOTE — BHH Group Notes (Signed)
[name] LCSW Aftercare Discharge Planning Group Note   01/17/2015 9:20 AM  Participation Quality:  Appropriate   Mood/Affect:  Appropriate  Depression Rating:  6  Anxiety Rating:  5  Thoughts of Suicide:  No Will you contract for safety?   NA  Current AVH:  No  Plan for Discharge/Comments:  Pt reports that he is still feeling bad and reports moderate withdrawals. Pt did not sleep well. Pt referred to [facility], [facility] and [facility]. Pt no longer wants to go to [facility]. Pt interested in [facility] and [facility]. CSW providing pt info for these places. CSW assessing.   Transportation Means: unknown at this time   Supports: Scientist, research (life sciences), Research officer, trade union

## 2015-01-17 NOTE — Progress Notes (Signed)
[facility] MD Progress Note  01/17/2015 12:27 PM Danny Hale  MRN:  [id number]  Subjective: "I feel like I'm better overall. I just miss my kids a lot and I'm stressed about that. I have to figure out how I'm going to live a sober life from here on out, which will be really hard since I've been addicted for 20 years".    Objective:  Patient seen; chart reviewed. Pt denies SI, HI, and AVH, contracts for safety. He reports that he woke up feeling extremely overwhelmed on Sunday but is much better now. He reports that his anxiety is 7/10 and depression is 6/10 with his anxiety primarily arising from missing his family and secondarily from fears of coping with sobriety. Pt reports that he fears he will be discharged before long-term placement can occur and he would like to go to a 30-90 day program if possible. Pt also complains of migraine headache with Tylenol being ineffective here and at home. Imitrex 25mg  q6h PRN migraine prescribed as pt has never tried this medication; pt alert/oriented and no history of TBI or memory deficits. He reports that his sleep is poor and that he was using 300mg  Trazodone outpatient. Increased dosage to 200mg  qhs PRN.   Principal Problem: Polysubstance dependence including opioid type drug, continuous use Diagnosis:   Patient Active Problem List   Diagnosis Date Noted  . Polysubstance dependence including opioid type drug, continuous use [F11.229] 01/13/2015  . Substance induced mood disorder [F19.94] 01/12/2015  . Chronic pain of right ankle [M25.571, G89.29] 05/20/2014  . Adjustment disorder with anxious mood [F43.22] 05/18/2014  . Asthma with acute exacerbation [J45.901] 05/18/2014  . Pain in joint, lower leg [M25.569] 05/18/2014  . Status asthmaticus with COPD (chronic obstructive pulmonary disease) [J44.9] 03/28/2014  . Asthma exacerbation [J45.901] 01/05/2013  . Status asthmaticus [J45.902] 01/05/2013   Total Time spent with patient: 25 minutes  Past  Medical History:  Past Medical History  Diagnosis Date  . Asthma   . Obesity   . COPD (chronic obstructive pulmonary disease)   . Emphysema   . Cigarette smoker     Past Surgical History  Procedure Laterality Date  . Ankle surgery     Family History:  Family History  Problem Relation Age of Onset  . COPD Neg Hx   . Birth defects Sister    Social History:  History  Alcohol Use  . Yes    Comment: occ     History  Drug Use No    History   Social History  . Marital Status: Legally Separated    Spouse Name: N/A  . Number of Children: N/A  . Years of Education: N/A   Social History Main Topics  . Smoking status: Current Every Day Smoker -- 0.25 packs/day    Types: Cigarettes  . Smokeless tobacco: Never Used  . Alcohol Use: Yes     Comment: occ  . Drug Use: No  . Sexual Activity: Yes    Birth Control/ Protection: None   Other Topics Concern  . None   Social History Narrative   Additional History:    Sleep: Poor  Appetite:  Good   Assessment:   Musculoskeletal: Strength & Muscle Tone: within normal limits Gait & Station: normal Patient leans: N/A   Psychiatric Specialty Exam: Physical Exam  Vitals reviewed. Constitutional: He is oriented to person, place, and time.  Neck: Normal range of motion.  Respiratory: Effort normal.  Musculoskeletal: Normal range of motion.  Neurological:  He is alert and oriented to person, place, and time.  Psychiatric: His behavior is normal. His mood appears anxious. Cognition and memory are normal. He exhibits a depressed mood. Suicidal: Passive.    ROS  Blood pressure 138/89, pulse 76, temperature 97.8 F (36.6 C), temperature source Oral, resp. rate 16, height 6\' 1"  (1.854 m), weight 141.976 kg (313 lb).Body mass index is 41.3 kg/(m^2).   General Appearance: Casual  Eye Contact:: Minimal  Speech: Clear and Coherent  Volume: Decreased  Mood: Anxious, Depressed, and Irritable  Affect: Flat  Thought  Process: Goal Directed  Orientation: Full (Time, Place, and Person)  Thought Content: Negative  Suicidal Thoughts:Denies  Homicidal Thoughts: No  Memory: Immediate; Fair Recent; Fair Remote; Fair  Judgement: Impaired  Insight: Lacking  Psychomotor Activity: Normal  Concentration: Fair  Recall: [employer] of Knowledge:Fair  Language: Good  Akathisia: No  Handed: Right  AIMS (if indicated):    Assets: Communication Skills Desire for Improvement Physical Health  Sleep: Number of Hours: 4.75  Cognition: WNL  ADL's: Intact       Current Medications: Current Facility-Administered Medications  Medication Dose Route Frequency Provider Last Rate Last Dose  . acetaminophen (TYLENOL) tablet 650 mg  650 mg Oral Q4H PRN Laverle Hobby, PA-C   650 mg at 01/17/15 1152  . albuterol (PROVENTIL HFA;VENTOLIN HFA) 108 (90 BASE) MCG/ACT inhaler 2 puff  2 puff Inhalation Q4H PRN Laverle Hobby, PA-C      . alum & mag hydroxide-simeth (MAALOX/MYLANTA) 200-200-20 MG/5ML suspension 30 mL  30 mL Oral Q4H PRN Laverle Hobby, PA-C      . busPIRone (BUSPAR) tablet 5 mg  5 mg Oral BID Shuvon B Rankin, NP   5 mg at 01/17/15 3810  . cloNIDine (CATAPRES) tablet 0.1 mg  0.1 mg Oral BH-qamhs Laverle Hobby, PA-C   0.1 mg at 01/17/15 1751   Followed by  . [START ON 01/18/2015] cloNIDine (CATAPRES) tablet 0.1 mg  0.1 mg Oral QAC breakfast Laverle Hobby, PA-C   0.1 mg at 01/16/15 0912  . dicyclomine (BENTYL) tablet 20 mg  20 mg Oral Q6H PRN Laverle Hobby, PA-C   20 mg at 01/13/15 1145  . gabapentin (NEURONTIN) capsule 200 mg  200 mg Oral TID Kerrie Buffalo, NP   200 mg at 01/17/15 1149  . hydrOXYzine (ATARAX/VISTARIL) tablet 25 mg  25 mg Oral Q6H PRN Laverle Hobby, PA-C   25 mg at 01/17/15 1155  . ibuprofen (ADVIL,MOTRIN) tablet 600 mg  600 mg Oral Q6H PRN Benjamine Mola, FNP      . lidocaine (LIDODERM) 5 % 2 patch  2 patch Transdermal Q24H Kerrie Buffalo, NP   2  patch at 01/16/15 1551  . loperamide (IMODIUM) capsule 2-4 mg  2-4 mg Oral PRN Laverle Hobby, PA-C      . magnesium hydroxide (MILK OF MAGNESIA) suspension 30 mL  30 mL Oral Daily PRN Laverle Hobby, PA-C      . methocarbamol (ROBAXIN) tablet 750 mg  750 mg Oral Q8H PRN Kerrie Buffalo, NP   750 mg at 01/16/15 2144  . mirtazapine (REMERON) tablet 15 mg  15 mg Oral QHS Shuvon B Rankin, NP   15 mg at 01/16/15 2144  . nicotine (NICODERM CQ - dosed in mg/24 hours) patch 21 mg  21 mg Transdermal Daily Kerrie Buffalo, NP   21 mg at 01/17/15 0258  . ondansetron (ZOFRAN-ODT) disintegrating tablet 4 mg  4  mg Oral Q6H PRN Laverle Hobby, PA-C   4 mg at 01/13/15 1145  . SUMAtriptan (IMITREX) tablet 25 mg  25 mg Oral Q6H PRN Benjamine Mola, FNP      . traZODone (DESYREL) tablet 200 mg  200 mg Oral QHS PRN Benjamine Mola, FNP        Lab Results:  No results found for this or any previous visit (from the past 48 hour(s)).  Physical Findings: AIMS: Facial and Oral Movements Muscles of Facial Expression: None, normal Lips and Perioral Area: None, normal Jaw: None, normal Tongue: None, normal,Extremity Movements Upper (arms, wrists, hands, fingers): None, normal Lower (legs, knees, ankles, toes): None, normal, Trunk Movements Neck, shoulders, hips: None, normal, Overall Severity Severity of abnormal movements (highest score from questions above): None, normal Incapacitation due to abnormal movements: None, normal Patient's awareness of abnormal movements (rate only patient's report): No Awareness, Dental Status Current problems with teeth and/or dentures?: No Does patient usually wear dentures?: No  CIWA:  CIWA-Ar Total: 1 COWS:  COWS Total Score: 2  Treatment Plan Summary: Review of chart, vital signs, medications, and notes.  1-Individual and group therapy  2-Medication management for depression and anxiety: Medications reviewed with the patient and she stated no untoward effects, unchanged.    -Continue Lidoderm 2 patches for back pain. -Continue Robaxin to 750 mg -Continue Gabapentin 200 TID for numbness and tingling to bilateral hands. -Increase Trazodone to 200mg  qhs PRN insomnia (hx 300mg  at home, will titrate as necessary) -Discontinue Naproxen bid -Replace Naproxen with ibuprofen 600mg  q6h PRN headache or mild pain -Start Imitrex 25mg  q6h PRN severe headache/migraine  3-Coping skills for depression, anxiety  4-Continue crisis stabilization and management  5-Address health issues--monitoring vital signs, stable  6-Treatment plan in progress to prevent relapse of depression and anxiety    Medical Decision Making:  Established Problem, Stable/Improving (1), Review of Psycho-Social Stressors (1), Discuss test with performing physician (1), Decision to obtain old records (1), Review and summation of old records (2), Review or order medicine tests (1), Review of Medication Regimen & Side Effects (2) and Review of New Medication or Change in Dosage (2)  Benjamine Mola FNP-BC 01/17/2015, 12:27 PM  Agree with progress note as above

## 2015-01-17 NOTE — Progress Notes (Signed)
Patient did attend the evening speaker AA meeting.  

## 2015-01-17 NOTE — Progress Notes (Addendum)
D:  Patient denied SI and HI, contracts for safety.  Denied A/V hallucinations.  Denied pain.  Stated he slept "so-so". A:  Medications administered per MD orders.  Emotional support and encouragement given patient. R:  Safety maintained with 15 minute checks. Patient's self inventory sheet, patient has poor sleep, sleep medication is somewhat helpful.  Good appetite, low energy level, good concentration.  Rated depression and anxiety 6, denied hopeless.  Withdrawals of diarrhea, chilling, cravings, cramping, agitation, runny nose, irritability.  Denied SI.  Physical problems of pain, headaches, migraines.  Physical pain, worst #6, lower back, head, legs.  Pain medication is somewhat helpful.  Goal is to have a good attitude and focus on one day at a time.  Plans being around positive people and staying positive.  Does have discharge plans.  No problems anticipated after discharge.

## 2015-01-17 NOTE — BHH Group Notes (Signed)
[place] LCSW Group Therapy  01/17/2015 2:08 PM  Type of Therapy:  Group Therapy  Participation Level:  Active  Participation Quality:  Attentive  Affect:  Appropriate  Cognitive:  Oriented  Insight:  Improving  Engagement in Therapy:  Improving  Modes of Intervention:  Confrontation, Discussion, Education, Exploration, Problem-solving, Rapport Building, Socialization and Support  Summary of Progress/Problems: Today's Topic: Overcoming Obstacles. Pt identified obstacles faced currently and processed barriers involved in overcoming these obstacles. Pt identified steps necessary for overcoming these obstacles and explored motivation (internal and external) for facing these difficulties head on. Pt further identified one area of concern in their lives and chose a skill of focus pulled from their "toolbox." Danny Hale was attentive and engaged during today's processing group. He shared that his main obstacle invovles "staying clean and not relapsing." Danny Hale was informed that he was accepted to [facility] for Thursday at [place]. Pt stated that he was happy to be accepted and hopes to stay 90 days if possible. Danny Hale shared that he is aware that he is a high relapse risk if he returns home without further inpatient treatment.   Smart, Jasraj Lappe LCSWA  01/17/2015, 2:08 PM

## 2015-01-17 NOTE — Progress Notes (Signed)
Patient sad and depressed. Patient complained of body pain of 4/10. Denies SI, AH/VH at this time. Minimal conversation. Stated "I want what I can get tonight". Patient contracted for safety stated "I am not suicidal; am not going to do anything".  Patient walked up to this writer after 10 minutes of gotten all his bedtime medications and stated "I can't sleep". This Probation officer encouraged patient to give the medications time to act. Patient stated "OK"  and went back to his room. Every 15 minutes check for safety maintained. Will continue to monitor for safety and stability.

## 2015-01-17 NOTE — Plan of Care (Signed)
Problem: Consults Goal: Depression Patient Education See Patient Education Module for education specifics.  Outcome: Completed/Met Date Met:  01/17/15 Nurse discussed coping skills for depression with patient.        

## 2015-01-17 NOTE — Progress Notes (Signed)
Patient at the window at the beginning of the shift with complaint of pain. He rated her pain at 7 on a scale of 1-10 with 10 the worst. He asked; "what can I have for pain". Writer offered patient PRN  Ibuprofen for his back pain. He denied SI/HI and denied Hallucinations. Writer encouraged and supported patient. Q 15 minute check continues as ordered to maintain safety.

## 2015-01-18 DIAGNOSIS — F11220 Opioid dependence with intoxication, uncomplicated: Secondary | ICD-10-CM

## 2015-01-18 DIAGNOSIS — F1994 Other psychoactive substance use, unspecified with psychoactive substance-induced mood disorder: Secondary | ICD-10-CM

## 2015-01-18 DIAGNOSIS — F411 Generalized anxiety disorder: Secondary | ICD-10-CM | POA: Diagnosis present

## 2015-01-18 MED ORDER — METHOCARBAMOL 750 MG PO TABS
750.0000 mg | ORAL_TABLET | Freq: Three times a day (TID) | ORAL | Status: DC | PRN
  Administered 2015-01-18 – 2015-01-19 (×3): 750 mg via ORAL
  Filled 2015-01-18: qty 30
  Filled 2015-01-18 (×3): qty 1

## 2015-01-18 MED ORDER — HYDROXYZINE HCL 25 MG PO TABS
25.0000 mg | ORAL_TABLET | Freq: Four times a day (QID) | ORAL | Status: DC | PRN
  Administered 2015-01-18 – 2015-01-20 (×5): 25 mg via ORAL
  Filled 2015-01-18 (×2): qty 1
  Filled 2015-01-18: qty 40
  Filled 2015-01-18 (×4): qty 1

## 2015-01-18 MED ORDER — GABAPENTIN 300 MG PO CAPS
300.0000 mg | ORAL_CAPSULE | Freq: Three times a day (TID) | ORAL | Status: DC
  Administered 2015-01-18 – 2015-01-19 (×4): 300 mg via ORAL
  Filled 2015-01-18 (×6): qty 1

## 2015-01-18 MED ORDER — BUSPIRONE HCL 10 MG PO TABS
10.0000 mg | ORAL_TABLET | Freq: Two times a day (BID) | ORAL | Status: DC
  Administered 2015-01-18 – 2015-01-19 (×2): 10 mg via ORAL
  Filled 2015-01-18 (×4): qty 1

## 2015-01-18 NOTE — BHH Group Notes (Signed)
[place] LCSW Group Therapy  01/18/2015 1:29 PM  Type of Therapy:  Group Therapy  Participation Level:  Active  Participation Quality:  Attentive  Affect:  Appropriate  Cognitive:  Alert and Oriented  Insight:  Engaged  Engagement in Therapy:  Engaged  Modes of Intervention:  Confrontation, Education, Socialization, Support, Problem-Solving  Summary of Progress/Problems: [facility] Speaker came to talk about his personal journey with substance abuse and addiction. The pt processed ways by which to relate to the speaker. [place] speaker provided handouts and educational information pertaining to groups and services offered by the [facility].   Smart, Amanie Mcculley LCSWA 01/18/2015, 1:29 PM

## 2015-01-18 NOTE — BHH Group Notes (Signed)
Adult Psychoeducational Group Note  Date:  01/18/2015 Time:  9:57 PM  Group Topic/Focus:  Wrap-Up Group:   The focus of this group is to help patients review their daily goal of treatment and discuss progress on daily workbooks.  Participation Level:  Active  Participation Quality:  Appropriate  Affect:  Appropriate  Cognitive:  Appropriate  Insight: Appropriate  Engagement in Group:  Engaged  Modes of Intervention:  Discussion  Additional Comments:  Danny Hale said his day was positive.  He enjoyed the speaker from the 1:15 group.  He is discharging Thursday to [facility].  His said he is nervous about going to a new place but his is okay.  Danny Hale A 01/18/2015, 9:57 PM

## 2015-01-18 NOTE — Plan of Care (Signed)
Problem: Consults Goal: Suicide Risk Patient Education (See Patient Education module for education specifics)  Outcome: Completed/Met Date Met:  01/18/15 Nurse discussed coping skills with patient.

## 2015-01-18 NOTE — Progress Notes (Signed)
[facility] MD Progress Note  01/18/2015 9:28 AM Danny Hale  MRN:  [id number] Subjective:  Danny Hale states he is really committed to abstinence. States he wants to be a parent to his kids and he is committed to the relationship with his GF. The drugs keep coming back triggered by among other things feeling like his mother and his GF want to control him. He goes to the drugs to cope with those feelings. He also deals with chronic pain what affects his mood. States he really needs more help past being here. Wants to pursue a residential treatment program. States that if he was to continue to use he will lose his family Principal Problem: Polysubstance dependence including opioid type drug, continuous use Diagnosis:   Patient Active Problem List   Diagnosis Date Noted  . Polysubstance dependence including opioid type drug, continuous use [F11.229] 01/13/2015  . Substance induced mood disorder [F19.94] 01/12/2015  . Chronic pain of right ankle [M25.571, G89.29] 05/20/2014  . Adjustment disorder with anxious mood [F43.22] 05/18/2014  . Asthma with acute exacerbation [J45.901] 05/18/2014  . Pain in joint, lower leg [M25.569] 05/18/2014  . Status asthmaticus with COPD (chronic obstructive pulmonary disease) [J44.9] 03/28/2014  . Asthma exacerbation [J45.901] 01/05/2013  . Status asthmaticus [J45.902] 01/05/2013   Total Time spent with patient: 30 minutes   Past Medical History:  Past Medical History  Diagnosis Date  . Asthma   . Obesity   . COPD (chronic obstructive pulmonary disease)   . Emphysema   . Cigarette smoker     Past Surgical History  Procedure Laterality Date  . Ankle surgery     Family History:  Family History  Problem Relation Age of Onset  . COPD Neg Hx   . Birth defects Sister    Social History:  History  Alcohol Use  . Yes    Comment: occ     History  Drug Use No    History   Social History  . Marital Status: Legally Separated    Spouse Name: N/A  . Number of  Children: N/A  . Years of Education: N/A   Social History Main Topics  . Smoking status: Current Every Day Smoker -- 0.25 packs/day    Types: Cigarettes  . Smokeless tobacco: Never Used  . Alcohol Use: Yes     Comment: occ  . Drug Use: No  . Sexual Activity: Yes    Birth Control/ Protection: None   Other Topics Concern  . None   Social History Narrative   Additional History:    Sleep: Poor  Appetite:  Fair   Assessment:   Musculoskeletal: Strength & Muscle Tone: within normal limits Gait & Station: normal Patient leans: N/A   Psychiatric Specialty Exam: Physical Exam  Review of Systems  Constitutional: Positive for malaise/fatigue.  HENT:       Migraines  Eyes: Positive for photophobia.  Respiratory: Positive for cough and shortness of breath.        OCD. emphysema  Cardiovascular: Negative.   Gastrointestinal: Negative.   Genitourinary: Negative.   Musculoskeletal: Positive for back pain and joint pain.  Skin: Negative.   Neurological: Positive for headaches.  Endo/Heme/Allergies: Negative.   Psychiatric/Behavioral: Positive for depression and substance abuse. The patient is nervous/anxious and has insomnia.     Blood pressure 116/67, pulse 100, temperature 97.5 F (36.4 C), temperature source Oral, resp. rate 24, height 6\' 1"  (1.854 m), weight 141.976 kg (313 lb).Body mass index is 41.3 kg/(m^2).  General Appearance: Fairly Groomed  Engineer, water::  Fair  Speech:  Clear and Coherent  Volume:  fluctuates  Mood:  Anxious, Depressed and in pain  Affect:  anxious worried  Thought Process:  Coherent and Goal Directed  Orientation:  Full (Time, Place, and Person)  Thought Content:  symptoms events worries concerns  Suicidal Thoughts:  No  Homicidal Thoughts:  No  Memory:  Immediate;   Fair Recent;   Fair Remote;   Fair  Judgement:  Fair  Insight:  Present and Shallow  Psychomotor Activity:  Restlessness  Concentration:  Fair  Recall:  [employer] of  Knowledge:Fair  Language: Fair  Akathisia:  No  Handed:  Right  AIMS (if indicated):     Assets:  Desire for Improvement  ADL's:  Intact  Cognition: WNL  Sleep:  Number of Hours: 4.75     Current Medications: Current Facility-Administered Medications  Medication Dose Route Frequency Provider Last Rate Last Dose  . acetaminophen (TYLENOL) tablet 650 mg  650 mg Oral Q4H PRN Laverle Hobby, PA-C   650 mg at 01/17/15 2124  . albuterol (PROVENTIL HFA;VENTOLIN HFA) 108 (90 BASE) MCG/ACT inhaler 2 puff  2 puff Inhalation Q4H PRN Laverle Hobby, PA-C      . alum & mag hydroxide-simeth (MAALOX/MYLANTA) 200-200-20 MG/5ML suspension 30 mL  30 mL Oral Q4H PRN Laverle Hobby, PA-C      . busPIRone (BUSPAR) tablet 5 mg  5 mg Oral BID Shuvon B Rankin, NP   5 mg at 01/18/15 0724  . gabapentin (NEURONTIN) capsule 200 mg  200 mg Oral TID Kerrie Buffalo, NP   200 mg at 01/18/15 0725  . ibuprofen (ADVIL,MOTRIN) tablet 600 mg  600 mg Oral Q6H PRN Benjamine Mola, FNP   600 mg at 01/17/15 2002  . lidocaine (LIDODERM) 5 % 2 patch  2 patch Transdermal Q24H Kerrie Buffalo, NP   2 patch at 01/17/15 1717  . magnesium hydroxide (MILK OF MAGNESIA) suspension 30 mL  30 mL Oral Daily PRN Laverle Hobby, PA-C      . mirtazapine (REMERON) tablet 15 mg  15 mg Oral QHS Shuvon B Rankin, NP   15 mg at 01/17/15 2115  . nicotine (NICODERM CQ - dosed in mg/24 hours) patch 21 mg  21 mg Transdermal Daily Kerrie Buffalo, NP   21 mg at 01/18/15 0724  . SUMAtriptan (IMITREX) tablet 25 mg  25 mg Oral Q6H PRN Benjamine Mola, FNP   25 mg at 01/17/15 1230  . traZODone (DESYREL) tablet 200 mg  200 mg Oral QHS PRN Benjamine Mola, FNP   200 mg at 01/17/15 2122    Lab Results: No results found for this or any previous visit (from the past 48 hour(s)).  Physical Findings: AIMS: Facial and Oral Movements Muscles of Facial Expression: None, normal Lips and Perioral Area: None, normal Jaw: None, normal Tongue: None, normal,Extremity  Movements Upper (arms, wrists, hands, fingers): None, normal Lower (legs, knees, ankles, toes): None, normal, Trunk Movements Neck, shoulders, hips: None, normal, Overall Severity Severity of abnormal movements (highest score from questions above): None, normal Incapacitation due to abnormal movements: None, normal Patient's awareness of abnormal movements (rate only patient's report): No Awareness, Dental Status Current problems with teeth and/or dentures?: No Does patient usually wear dentures?: No  CIWA:  CIWA-Ar Total: 1 COWS:  COWS Total Score: 2  Treatment Plan Summary: Daily contact with patient to assess and evaluate symptoms and progress in  treatment and Medication management Supportive approach/coping skills/relapse prevention polysubstance dependence: complete the opioid detox. Work a relapse Recruitment consultant. Facilitate his admission to [facility] residential treatment on Thursday AM Anxiety; will increase the Buspar to 10 mg BID and the Neurontin to 300 mg TID Pain; will resume the Robaxin at 750 mg Q 8 and increase the Neurontin to 300 mg TID Will work with CBT, mindufulness to address the anxiety Medical Decision Making:  Review of Psycho-Social Stressors (1), Review or order clinical lab tests (1), Review of Medication Regimen & Side Effects (2) and Review of New Medication or Change in Dosage (2)     Xavier Fournier A 01/18/2015, 9:28 AM

## 2015-01-18 NOTE — Progress Notes (Signed)
Recreation Therapy Notes  Animal-Assisted Activity (AAA) Program Checklist/Progress Notes Patient Eligibility Criteria Checklist & Daily Group note for Rec Tx Intervention  Date: 04.05.2016 Time: 2:45pm Location: [address]   AAA/T Program Assumption of Risk Form signed by Patient/ or Parent Legal Guardian yes  Patient is free of allergies or sever asthma yes  Patient reports no fear of animals yes  Patient reports no history of cruelty to animals yes  Patient understands his/her participation is voluntary yes  Behavioral Response: Did not attend.   Laureen Ochs Reneshia Zuccaro, LRT/CTRS  Betheny Suchecki L 01/18/2015 5:15 PM

## 2015-01-18 NOTE — Tx Team (Signed)
Interdisciplinary Treatment Plan Update (Adult)   Date: 01/18/2015   Time Reviewed: 10:40 AM  Progress in Treatment:  Attending groups: Yes  Participating in groups:  Yes  Taking medication as prescribed: Yes  Tolerating medication: Yes  Family/Significant othe contact made: SPE completed with pt's fiance.  Patient understands diagnosis: Yes, AEB seeking treatment for SI with plan, Opiate/benzo detox, depression/mood instability, and for medication management.  Discussing patient identified problems/goals with staff: Yes  Medical problems stabilized or resolved: Yes  Denies suicidal/homicidal ideation: Yes, during group and self report.  Patient has not harmed self or Others: Yes  New problem(s) identified:  Discharge Plan or Barriers: Pt has been accepted to [facility] for Thursday at [facility]. Pt's family member will transport him Thursday morning around 7:15AM. Pt given info about [facility], AA list, and ADS-encouraged to follow-up with them for outpatient services and additional support after inpatient treatment.  Additional comments: Danny Hale is a 39 y.o., engaged, employed, Caucasian male presenting to [facility] as a walk-in requesting detox from opiates and benzos. Pt also presents with depression with SI with a plan to "get high and shoot myself". Pt presents with depressed mood and congruent affect, becoming tearful during assessment. Pt is open and cooperative, fair eye-contact, logical thought process, and linear speech pattern. Pt is engaged and states that his drug use is affecting his relationship with his fiancee and his ability to work, adding, "I'm going to lose my family and my home". Pt also has a hx of legal issues related to his SA. Pt states that he has accidentally overdosed three times in the past. Pt endorses depressive sx, including hopelessness, fatigue, insomnia, feelings of worthlessness, guilt, crying spells, anhedonia, irritability, social  isolation, and suicidal thoughts. Pt reports thinking of various plans to kill himself, such as taking large doses of drugs and then shooting himself. Pt denies any access to weapons. He denies HI. He reports a hx of auditory and visual hallucinations of "people who aren't really there". He states that he has these hallucinations even when not under the influence of substances.Pt reports daily use of prescription pain pills, regular use of anxiolytics (i.e. Xanax), and occasional use of heroin when pain pills cannot be located. Pt reports a hx of cocaine use as well but denies any current use of this drug. Pt denies any past suicide attempts. He reports two prior hospitalizations following (unintentional) overdoses of drugs - at [facility] and [facility]. Pt also states that he went to "mental health" about 3 years ago for treatment and was tried on various psychiatric medications, which he states did not work for him.  4/5/16Luvenia Hale has been attending and participating in groups. He reports minimal withdrawals and reports that his depression is decreasing since finding out that he was accepted for inpatient treatment. Pt presents with depressed mood and flat affect. Reporting minimal pain. No SI/HI/AVH reported.  Reason for Continuation of Hospitalization: Medication stabilization Mood instability/depression Estimated length of stay: 2 days (d/c scheduled for Thursday at 7:15AM) For review of initial/current patient goals, please see plan of care.  Attendees:  Patient:    Family:    Physician: Dr. Sabra Heck MD  01/18/2015 10:40 AM   Nursing: [place]; [facility] RN 01/18/2015 10:40 AM   Clinical Social Worker [place] [place]  01/18/2015 10:40 AM   Other: Adela Glimpse. LCSW 01/18/2015 10:40 AM   Other: Gerline Legacy Nurse CM 01/18/2015 10:40 AM   Other:  Hilda Lias, Community Care Coordinator  01/18/2015 10:40 AM   Other:    Scribe for Treatment Team:  [place] LCSWA 01/18/2015 10:40 AM

## 2015-01-18 NOTE — [facility] Group Notes (Signed)
The focus of this group is to educate the patient on the purpose and policies of crisis stabilization and provide a format to answer questions about their admission.  The group details unit policies and expectations of patients while admitted.  Patient attended 0900 nurse education orientation group this morning.  Patient actively participated, appropriate affect, alert, appropriate insight and engagement.  Today patient will work on 3 goals for discharge.  

## 2015-01-18 NOTE — Progress Notes (Signed)
D:  Patient's self inventory sheet, patient has fair sleep, sleep medication helps somewhat.  Energy level good, good concentration, good appetite.  Rated depression 5, denied hopelesss, anxiety 6.  Withdrawals, cravings, cramping, agitation, runny nose, irritability.  Denied SI.  Physical problems pain, headaches in past 24 hours.  Back, leg pain.  Pain medication is somewhat helpful.  Goal is to focus on going to new treatment center and not be scared about the future.  Plans to take one day at a time.  Will discuss meds with MD.  Does have discharge plans.  No problems anticipated after discharge. A:  Medications administered per MD orders.  Emotional support and encouragement given patient. R:  Denied SI and HI, contracts for safety.  Denied A/V hallucinations.  Safety maintained with 15 minute checks.

## 2015-01-19 ENCOUNTER — Encounter (HOSPITAL_COMMUNITY): Payer: Self-pay | Admitting: Registered Nurse

## 2015-01-19 MED ORDER — HYDROXYZINE HCL 25 MG PO TABS: 25.0000 mg | ORAL_TABLET | Freq: Four times a day (QID) | ORAL | Status: DC | PRN

## 2015-01-19 MED ORDER — SUMATRIPTAN SUCCINATE 25 MG PO TABS: 25.0000 mg | ORAL_TABLET | Freq: Four times a day (QID) | ORAL | Status: DC | PRN

## 2015-01-19 MED ORDER — BUSPIRONE HCL 10 MG PO TABS: 10.0000 mg | ORAL_TABLET | Freq: Two times a day (BID) | ORAL | Status: DC

## 2015-01-19 MED ORDER — GABAPENTIN 300 MG PO CAPS: 300.0000 mg | ORAL_CAPSULE | Freq: Three times a day (TID) | ORAL | Status: DC

## 2015-01-19 MED ORDER — TRAZODONE HCL 150 MG PO TABS
300.0000 mg | ORAL_TABLET | Freq: Every evening | ORAL | Status: DC | PRN
  Administered 2015-01-19: 300 mg via ORAL
  Filled 2015-01-19: qty 2
  Filled 2015-01-19: qty 28

## 2015-01-19 MED ORDER — GABAPENTIN 400 MG PO CAPS: 400.0000 mg | ORAL_CAPSULE | Freq: Three times a day (TID) | ORAL | Status: DC

## 2015-01-19 MED ORDER — TRAZODONE HCL 100 MG PO TABS: 200.0000 mg | ORAL_TABLET | Freq: Every evening | ORAL | Status: DC | PRN

## 2015-01-19 MED ORDER — TRAZODONE HCL 150 MG PO TABS: 300.0000 mg | ORAL_TABLET | Freq: Every evening | ORAL | Status: DC | PRN

## 2015-01-19 MED ORDER — TRAZODONE HCL 300 MG PO TABS: 300.0000 mg | ORAL_TABLET | Freq: Every evening | ORAL | Status: DC | PRN

## 2015-01-19 MED ORDER — GABAPENTIN 400 MG PO CAPS
400.0000 mg | ORAL_CAPSULE | Freq: Three times a day (TID) | ORAL | Status: DC
  Administered 2015-01-19: 400 mg via ORAL
  Filled 2015-01-19: qty 42
  Filled 2015-01-19: qty 1
  Filled 2015-01-19 (×2): qty 42
  Filled 2015-01-19 (×2): qty 1

## 2015-01-19 MED ORDER — BUSPIRONE HCL 15 MG PO TABS: 15.0000 mg | ORAL_TABLET | Freq: Two times a day (BID) | ORAL | Status: DC

## 2015-01-19 MED ORDER — LIDOCAINE 5 % EX PTCH: 2.0000 | MEDICATED_PATCH | CUTANEOUS | Status: DC

## 2015-01-19 MED ORDER — METHOCARBAMOL 750 MG PO TABS: 750.0000 mg | ORAL_TABLET | Freq: Three times a day (TID) | ORAL | Status: DC | PRN

## 2015-01-19 MED ORDER — BUSPIRONE HCL 15 MG PO TABS
15.0000 mg | ORAL_TABLET | Freq: Two times a day (BID) | ORAL | Status: DC
Start: 2015-01-19 — End: 2015-01-20
  Administered 2015-01-19: 15 mg via ORAL
  Filled 2015-01-19: qty 1
  Filled 2015-01-19 (×2): qty 28
  Filled 2015-01-19: qty 3
  Filled 2015-01-19: qty 1

## 2015-01-19 MED ORDER — MIRTAZAPINE 15 MG PO TABS: 15.0000 mg | ORAL_TABLET | Freq: Every day | ORAL | Status: AC

## 2015-01-19 NOTE — Progress Notes (Signed)
Pt affect/ mood anxious and depressed.. -SI/HI, -A/[place], verbally contracts for safety. Pt attended group and interacted with peers. C/o back pain, muscle spasms and anxiety, see MAR.  Emotional support and encouragement given. Will continue to monitor closely and evaluate for stabilization.

## 2015-01-19 NOTE — Progress Notes (Signed)
Patient ID: Danny Hale, male   DOB: 16-Apr-1976, 39 y.o.   MRN: [id number] D: Patient denies SI/HI and auditory and visual hallucinations. Reports he is anxious with anxiety 8 on 1 to 10 scale. Reports depression rated 6 on 1 to 10 scale.  Depressed affect at times but engages cheerfully with peers.  Reports pain in back and legs.Set goal to stay in the moment. Looking forward to discharge.  A: Patient given emotional support from RN. Patient given medications per MD orders. Patient encouraged to continue attending groups and unit activities. Patient encouraged to come to staff with any questions or concerns.  R: Patient remains cooperative and appropriate. Will continue to monitor patient for safety.

## 2015-01-19 NOTE — Discharge Summary (Signed)
Physician Discharge Summary Note  Patient:  Danny Hale is an 39 y.o., male MRN:  [id number] DOB:  12-Nov-1975 Patient phone:  [phone] (home)  Patient address:   [place] [address],  Total Time spent with patient: Greater than 30 minutes  Date of Admission:  01/12/2015 Date of Discharge: 01/20/2015  Reason for Admission:  Per H&P admission:  Danny Hale is a 39 y.o., engaged, employed, Caucasian male presenting to [facility] as a walk-in requesting detox from opiates and benzos. Pt also presents with depression with SI with a plan to "get high and shoot myself". Pt presents with depressed mood and congruent affect, becoming tearful during assessment. Pt is open and cooperative, fair eye-contact, logical thought process, and linear speech pattern. Pt is engaged and states that his drug use is affecting his relationship with his fiancee and his ability to work, adding, "I'm going to lose my family and my home". Pt also has a hx of legal issues related to his SA. Pt states that he has accidentally overdosed three times in the past. Pt endorses depressive sx, including hopelessness, fatigue, insomnia, feelings of worthlessness, guilt, crying spells, anhedonia, irritability, social isolation, and suicidal thoughts. Pt reports thinking of various plans to kill himself, such as taking large doses of drugs and then shooting himself. Pt denies any access to weapons. He denies HI. He reports a hx of auditory and visual hallucinations of "people who aren't really there". He states that he has these hallucinations even when not under the influence of substances.  Pt reports daily use of prescription pain pills, regular use of anxiolytics (i.e. Xanax), and occasional use of heroin when pain pills cannot be located. Pt reports a hx of cocaine use as well but denies any current use of this drug. Pt denies any past suicide attempts. He reports two prior hospitalizations following  (unintentional) overdoses of drugs - at [facility] and [facility]. Pt also states that he went to "mental health" about 3 years ago for treatment and was tried on various psychiatric medications, which he states did not work for him.  Principal Problem: Polysubstance dependence including opioid type drug, continuous use Discharge Diagnoses: Patient Active Problem List   Diagnosis Date Noted  . GAD (generalized anxiety disorder) [F41.1] 01/18/2015  . Polysubstance dependence including opioid type drug, continuous use [F11.229] 01/13/2015  . Substance induced mood disorder [F19.94] 01/12/2015  . Chronic pain of right ankle [M25.571, G89.29] 05/20/2014  . Adjustment disorder with anxious mood [F43.22] 05/18/2014  . Asthma with acute exacerbation [J45.901] 05/18/2014  . Pain in joint, lower leg [M25.569] 05/18/2014  . Status asthmaticus with COPD (chronic obstructive pulmonary disease) [J44.9] 03/28/2014  . Asthma exacerbation [J45.901] 01/05/2013  . Status asthmaticus [J45.902] 01/05/2013    Musculoskeletal: Strength & Muscle Tone: within normal limits Gait & Station: normal Patient leans: N/A  Psychiatric Specialty Exam:  See Suicide Risk Assessment Physical Exam  Constitutional: He is oriented to person, place, and time.  Neck: Normal range of motion.  Respiratory: Effort normal.  Musculoskeletal: Normal range of motion.  Neurological: He is alert and oriented to person, place, and time.  Psychiatric: He has a normal mood and affect. His speech is normal and behavior is normal. Thought content normal.    Review of Systems  Musculoskeletal: Positive for myalgias.  Psychiatric/Behavioral: Positive for substance abuse. Negative for suicidal ideas, hallucinations and memory loss. Depression: Stable. Nervous/anxious: Stable. Insomnia: Stable.   All other systems reviewed and are  negative.   Blood pressure 126/69, pulse 90, temperature 98.4 F (36.9 C), temperature source Oral, resp. rate  18, height 6\' 1"  (1.854 m), weight 141.976 kg (313 lb).Body mass index is 41.3 kg/(m^2).    Past Medical History:  Past Medical History  Diagnosis Date  . Asthma   . Obesity   . COPD (chronic obstructive pulmonary disease)   . Emphysema   . Cigarette smoker     Past Surgical History  Procedure Laterality Date  . Ankle surgery     Family History:  Family History  Problem Relation Age of Onset  . COPD Neg Hx   . Birth defects Sister    Social History:  History  Alcohol Use  . Yes    Comment: occ     History  Drug Use No    History   Social History  . Marital Status: Legally Separated    Spouse Name: N/A  . Number of Children: N/A  . Years of Education: N/A   Social History Main Topics  . Smoking status: Current Every Day Smoker -- 0.25 packs/day    Types: Cigarettes  . Smokeless tobacco: Never Used  . Alcohol Use: Yes     Comment: occ  . Drug Use: No  . Sexual Activity: Yes    Birth Control/ Protection: None   Other Topics Concern  . None   Social History Narrative    Risk to Self: Is patient at risk for suicide?: Yes What has been your use of drugs/alcohol within the last 12 months?: Pt reports using alcohol, opiates and cocaine.  Risk to Others:   Prior Inpatient Therapy:   Prior Outpatient Therapy:    Level of Care:  RTC  Hospital Course:  Danny Hale was admitted for Polysubstance dependence including opioid type drug, continuous use and crisis management.  He was treated discharged with the medications listed below under Medication List.  Medical problems were identified and treated as needed.  Home medications were restarted as appropriate.  Improvement was monitored by observation and Shon Millet daily report of symptom reduction.  Emotional and mental status was monitored by daily self-inventory reports completed by Shon Millet and clinical staff.         Shon Millet was evaluated by the treatment team for stability  and plans for continued recovery upon discharge.  Shon Millet motivation was an integral factor for scheduling further treatment.  Employment, transportation, bed availability, health status, family support, and any pending legal issues were also considered during his hospital stay.  He was offered further treatment options upon discharge including but not limited to Residential, Intensive Outpatient, and Outpatient treatment.  Shon Millet will follow up with the services as listed below under Follow Up Information.     Upon completion of this admission the patient was both mentally and medically stable for discharge denying suicidal/homicidal ideation, auditory/visual/tactile hallucinations, delusional thoughts and paranoia.      Consults:  psychiatry  Significant Diagnostic Studies:  labs: CBC, CMET, ETOH, UDS  Discharge Vitals:   Blood pressure 126/69, pulse 90, temperature 98.4 F (36.9 C), temperature source Oral, resp. rate 18, height 6\' 1"  (1.854 m), weight 141.976 kg (313 lb). Body mass index is 41.3 kg/(m^2). Lab Results:   No results found for this or any previous visit (from the past 72 hour(s)).  Physical Findings: AIMS: Facial and Oral Movements Muscles of Facial Expression: None, normal Lips and  Perioral Area: None, normal Jaw: None, normal Tongue: None, normal,Extremity Movements Upper (arms, wrists, hands, fingers): None, normal Lower (legs, knees, ankles, toes): None, normal, Trunk Movements Neck, shoulders, hips: None, normal, Overall Severity Severity of abnormal movements (highest score from questions above): None, normal Incapacitation due to abnormal movements: None, normal Patient's awareness of abnormal movements (rate only patient's report): No Awareness, Dental Status Current problems with teeth and/or dentures?: No Does patient usually wear dentures?: No  CIWA:  CIWA-Ar Total: 1 COWS:  COWS Total Score: 2   See Psychiatric Specialty Exam and  Suicide Risk Assessment completed by Attending Physician prior to discharge.  Discharge destination:  [facility]  Is patient on multiple antipsychotic therapies at discharge:  No   Has Patient had three or more failed trials of antipsychotic monotherapy by history:  No    Recommended Plan for Multiple Antipsychotic Therapies: NA      Discharge Instructions    Activity as tolerated - No restrictions    Complete by:  As directed      Diet general    Complete by:  As directed      Discharge instructions    Complete by:  As directed   Take all of you medications as prescribed by your mental healthcare provider.  Report any adverse effects and reactions from your medications to your outpatient provider promptly. Do not engage in alcohol and or illegal drug use while on prescription medicines. In the event of worsening symptoms call the crisis hotline, 911, and or go to the nearest emergency department for appropriate evaluation and treatment of symptoms. Follow-up with your primary care provider for your medical issues, concerns and or health care needs.   Keep all scheduled appointments.  If you are unable to keep an appointment call to reschedule.  Let the nurse know if you will need medications before next scheduled appointment.            Medication List    STOP taking these medications        acetaminophen 325 MG tablet  Commonly known as:  TYLENOL     chlorpheniramine-HYDROcodone 10-8 MG/5ML Lqcr  Commonly known as:  TUSSIONEX PENNKINETIC ER     Fluticasone-Salmeterol 250-50 MCG/DOSE Aepb  Commonly known as:  ADVAIR     metoCLOPramide 10 MG tablet  Commonly known as:  REGLAN     naproxen sodium 220 MG tablet  Commonly known as:  ANAPROX     predniSONE 20 MG tablet  Commonly known as:  DELTASONE     traMADol 50 MG tablet  Commonly known as:  ULTRAM      TAKE these medications      Indication   albuterol 108 (90 BASE) MCG/ACT inhaler  Commonly known  as:  PROVENTIL HFA;VENTOLIN HFA  Inhale 2 puffs into the lungs every 4 (four) hours as needed for wheezing or shortness of breath.      busPIRone 15 MG tablet  Commonly known as:  BUSPAR  Take 1 tablet (15 mg total) by mouth 2 (two) times daily. For anxiety   Indication:  Anxiety Disorder, Symptoms of Feeling Anxious     gabapentin 400 MG capsule  Commonly known as:  NEURONTIN  Take 1 capsule (400 mg total) by mouth 3 (three) times daily.   Indication:  Alcohol Withdrawal Syndrome, c/o of tingling and numbness to hands     hydrOXYzine 25 MG tablet  Commonly known as:  ATARAX/VISTARIL  Take 1 tablet (25 mg total) by  mouth every 6 (six) hours as needed for anxiety.   Indication:  anxiety     lidocaine 5 %  Commonly known as:  LIDODERM  Place 2 patches onto the skin daily. Remove & Discard patch within 12 hours or as directed by MD   Indication:  Pain     methocarbamol 750 MG tablet  Commonly known as:  ROBAXIN  Take 1 tablet (750 mg total) by mouth every 8 (eight) hours as needed for muscle spasms.   Indication:  Musculoskeletal Pain     mirtazapine 15 MG tablet  Commonly known as:  REMERON  Take 1 tablet (15 mg total) by mouth at bedtime. For depression and sleep   Indication:  Trouble Sleeping, Major Depressive Disorder     SUMAtriptan 25 MG tablet  Commonly known as:  IMITREX  Take 1 tablet (25 mg total) by mouth every 6 (six) hours as needed for migraine. May repeat in 2 hours if headache persists or recurs.   Indication:  Migraine Headache     trazodone 300 MG tablet  Commonly known as:  DESYREL  Take 1 tablet (300 mg total) by mouth at bedtime as needed for sleep.   Indication:  Trouble Sleeping       Follow-up Information    Follow up with [facility] On 01/20/2015.   Why:  You have been accepted for admission on this date at 8:00AM. Make sure to bring photo ID and medication supply. (accepted per [facility] in admissions). MRN: [id number]   Contact information:    [address] [place], [place] [address] Phone: [phone] Fax: [phone]      Follow up with [facility]   Why:  If interested in outpatient medication management/therapy/SA Intensive Outpatient, please contact Zollie Beckers ext [phone] to schedule assessment for services after completing inpatient treatment.    Contact information:   [address] [place], [place] [address] Phone: [phone] Fax: [phone]      Follow-up recommendations:  Activity:  As tolerated Diet:  As tolerated  Comments:   Patient has been instructed to take medications as prescribed; and report adverse effects to outpatient provider.  Follow up with primary doctor for any medical issues and If symptoms recur report to nearest emergency or crisis hot line.    Total Discharge Time: Greater than 30 minutes  Signed: Earleen Newport, FNP-BC 01/19/2015, 4:13 PM  I personally assessed the patient and formulated the plan Geralyn Flash A. Sabra Heck, M.D.

## 2015-01-19 NOTE — Progress Notes (Signed)
[facility] MD Progress Note  01/19/2015 6:35 PM Danny Hale  MRN:  [id number] Subjective: Danny Hale endorses a lot of anxiety and worry as he anticipates leaving in the AM to go to [facility]. States he has a hard time getting used to new places, the anxiety builds up as he thinks about this. He is also thinking about the consequences of a relapse. States he wants to be a good father to his twing. He is worried concerned about his oldest son who he says went trough the worst of his using and having overt conflict with his mother. States he is not doing well in school. But does say he needs to focus on getting sober right now and work on mending the relationship with his son when he comes out. Principal Problem: Polysubstance dependence including opioid type drug, continuous use Diagnosis:   Patient Active Problem List   Diagnosis Date Noted  . GAD (generalized anxiety disorder) [F41.1] 01/18/2015  . Polysubstance dependence including opioid type drug, continuous use [F11.229] 01/13/2015  . Substance induced mood disorder [F19.94] 01/12/2015  . Chronic pain of right ankle [M25.571, G89.29] 05/20/2014  . Adjustment disorder with anxious mood [F43.22] 05/18/2014  . Asthma with acute exacerbation [J45.901] 05/18/2014  . Pain in joint, lower leg [M25.569] 05/18/2014  . Status asthmaticus with COPD (chronic obstructive pulmonary disease) [J44.9] 03/28/2014  . Asthma exacerbation [J45.901] 01/05/2013  . Status asthmaticus [J45.902] 01/05/2013   Total Time spent with patient: 30 minutes   Past Medical History:  Past Medical History  Diagnosis Date  . Asthma   . Obesity   . COPD (chronic obstructive pulmonary disease)   . Emphysema   . Cigarette smoker     Past Surgical History  Procedure Laterality Date  . Ankle surgery     Family History:  Family History  Problem Relation Age of Onset  . COPD Neg Hx   . Birth defects Sister    Social History:  History  Alcohol Use  . Yes    Comment: occ      History  Drug Use No    History   Social History  . Marital Status: Legally Separated    Spouse Name: N/A  . Number of Children: N/A  . Years of Education: N/A   Social History Main Topics  . Smoking status: Current Every Day Smoker -- 0.25 packs/day    Types: Cigarettes  . Smokeless tobacco: Never Used  . Alcohol Use: Yes     Comment: occ  . Drug Use: No  . Sexual Activity: Yes    Birth Control/ Protection: None   Other Topics Concern  . None   Social History Narrative   Additional History:    Sleep: Fair  Appetite:  Fair   Assessment:   Musculoskeletal: Strength & Muscle Tone: within normal limits Gait & Station: normal Patient leans: N/A   Psychiatric Specialty Exam: Physical Exam  Review of Systems  Constitutional: Negative.   HENT: Negative.   Eyes: Negative.   Respiratory: Negative.   Cardiovascular: Negative.   Gastrointestinal: Negative.   Genitourinary: Negative.   Musculoskeletal: Negative.   Skin: Negative.   Neurological: Negative.   Endo/Heme/Allergies: Negative.   Psychiatric/Behavioral: Positive for substance abuse. The patient is nervous/anxious.     Blood pressure 126/69, pulse 90, temperature 98.4 F (36.9 C), temperature source Oral, resp. rate 18, height 6\' 1"  (1.854 m), weight 141.976 kg (313 lb).Body mass index is 41.3 kg/(m^2).  General Appearance: Fairly Groomed  Eye  Contact::  Fair  Speech:  Clear and Coherent  Volume:  Normal  Mood:  Anxious and worried  Affect:  anxious worried  Thought Process:  Coherent and Goal Directed  Orientation:  Full (Time, Place, and Person)  Thought Content:  symptoms events worries concerns  Suicidal Thoughts:  No  Homicidal Thoughts:  No  Memory:  Immediate;   Fair Recent;   Fair Remote;   Fair  Judgement:  Fair  Insight:  Present  Psychomotor Activity:  Restlessness  Concentration:  Fair  Recall:  [employer] of Knowledge:Fair  Language: Fair  Akathisia:  No  Handed:  Right   AIMS (if indicated):     Assets:  Desire for Improvement  ADL's:  Intact  Cognition: WNL  Sleep:  Number of Hours: 6     Current Medications: Current Facility-Administered Medications  Medication Dose Route Frequency Provider Last Rate Last Dose  . acetaminophen (TYLENOL) tablet 650 mg  650 mg Oral Q4H PRN Laverle Hobby, PA-C   650 mg at 01/19/15 1209  . albuterol (PROVENTIL HFA;VENTOLIN HFA) 108 (90 BASE) MCG/ACT inhaler 2 puff  2 puff Inhalation Q4H PRN Laverle Hobby, PA-C      . alum & mag hydroxide-simeth (MAALOX/MYLANTA) 200-200-20 MG/5ML suspension 30 mL  30 mL Oral Q4H PRN Laverle Hobby, PA-C      . busPIRone (BUSPAR) tablet 15 mg  15 mg Oral BID Nicholaus Bloom, MD   15 mg at 01/19/15 1700  . gabapentin (NEURONTIN) capsule 400 mg  400 mg Oral TID Nicholaus Bloom, MD   400 mg at 01/19/15 1700  . hydrOXYzine (ATARAX/VISTARIL) tablet 25 mg  25 mg Oral Q6H PRN Nicholaus Bloom, MD   25 mg at 01/19/15 1820  . ibuprofen (ADVIL,MOTRIN) tablet 600 mg  600 mg Oral Q6H PRN Benjamine Mola, FNP   600 mg at 01/19/15 1702  . lidocaine (LIDODERM) 5 % 2 patch  2 patch Transdermal Q24H Kerrie Buffalo, NP   2 patch at 01/18/15 1702  . magnesium hydroxide (MILK OF MAGNESIA) suspension 30 mL  30 mL Oral Daily PRN Laverle Hobby, PA-C      . methocarbamol (ROBAXIN) tablet 750 mg  750 mg Oral Q8H PRN Nicholaus Bloom, MD   750 mg at 01/19/15 0800  . mirtazapine (REMERON) tablet 15 mg  15 mg Oral QHS Shuvon B Rankin, NP   15 mg at 01/18/15 2150  . nicotine (NICODERM CQ - dosed in mg/24 hours) patch 21 mg  21 mg Transdermal Daily Kerrie Buffalo, NP   21 mg at 01/19/15 0757  . SUMAtriptan (IMITREX) tablet 25 mg  25 mg Oral Q6H PRN Benjamine Mola, FNP   25 mg at 01/17/15 1230  . traZODone (DESYREL) tablet 300 mg  300 mg Oral QHS PRN Nicholaus Bloom, MD        Lab Results: No results found for this or any previous visit (from the past 48 hour(s)).  Physical Findings: AIMS: Facial and Oral Movements Muscles  of Facial Expression: None, normal Lips and Perioral Area: None, normal Jaw: None, normal Tongue: None, normal,Extremity Movements Upper (arms, wrists, hands, fingers): None, normal Lower (legs, knees, ankles, toes): None, normal, Trunk Movements Neck, shoulders, hips: None, normal, Overall Severity Severity of abnormal movements (highest score from questions above): None, normal Incapacitation due to abnormal movements: None, normal Patient's awareness of abnormal movements (rate only patient's report): No Awareness, Dental Status Current problems with teeth and/or  dentures?: No Does patient usually wear dentures?: No  CIWA:  CIWA-Ar Total: 1 COWS:  COWS Total Score: 2  Treatment Plan Summary: Daily contact with patient to assess and evaluate symptoms and progress in treatment and Medication management Polysubstance Dependence; will complete the detox and continue to work a relapse prevention plan Anxiety; will use CBT/mindfulness to help handle the anticipatory anxiety. Will increase Buspar to 15 mg BID Pain will increase the Neurontin to 400 mg TID Insomnia; will increase the Trazodone to 300 mg HS ( dose he has used before) Will facilitate admission to [facility] residential treatment in the morning Medical Decision Making:  Review of Psycho-Social Stressors (1), Review of Medication Regimen & Side Effects (2) and Review of New Medication or Change in Dosage (2)     Danny Hale A 01/19/2015, 6:35 PM

## 2015-01-19 NOTE — BHH Group Notes (Signed)
[facility] LCSW Aftercare Discharge Planning Group Note   01/19/2015 10:51 AM  Participation Quality:  Appropriate   Mood/Affect:  Appropriate  Depression Rating:  6  Anxiety Rating:  7  Thoughts of Suicide:  No Will you contract for safety?   NA  Current AVH:  No  Plan for Discharge/Comments:  Pt reports that he is "excited and nervous" about going to treatment tomorrow at [facility]. Pt reports no withdrawals and good sleep. Pt given [facility] info for outpatient treatment after [facility].   Transportation Means: mom coming at 7:15AM  Supports: mother and stepdad. Fiance   [employer]

## 2015-01-19 NOTE — Progress Notes (Signed)
D: Patient in the dayroom on approach.  Patient states he had a good day.  Patient states his goal was to work on his discharge plan.  Patient is discharging tomorrow to [facility].  Patient denies SI/HI and denies AVH.  Patient states he is excited about getting his life together.  A: Staff to monitor Q 15 mins for safety.  Encouragement and support offered.  Scheduled medications administered per orders. R: Patient remains safe on the unit.  Patient attended group tonight.  Patient visible on the unit and interacting with peers.  Patient taking administered medications.

## 2015-01-19 NOTE — BHH Suicide Risk Assessment (Signed)
[facility] Discharge Suicide Risk Assessment   Demographic Factors:  Male and Caucasian  Total Time spent with patient: 30 minutes  Musculoskeletal: Strength & Muscle Tone: within normal limits Gait & Station: normal Patient leans: N/A  Psychiatric Specialty Exam: Physical Exam  Review of Systems  Constitutional: Negative.   HENT: Negative.   Eyes: Negative.   Respiratory: Negative.   Cardiovascular: Negative.   Gastrointestinal: Negative.   Genitourinary: Negative.   Musculoskeletal: Positive for back pain.  Skin: Negative.   Neurological: Negative.   Endo/Heme/Allergies: Negative.   Psychiatric/Behavioral: Positive for substance abuse. The patient is nervous/anxious.     Blood pressure 126/69, pulse 90, temperature 98.4 F (36.9 C), temperature source Oral, resp. rate 18, height 6\' 1"  (1.854 m), weight 141.976 kg (313 lb).Body mass index is 41.3 kg/(m^2).  General Appearance: Fairly Groomed  Engineer, water::  Fair  Speech:  Clear and [id number]  Volume:  Normal  Mood:  Anxious  Affect:  Appropriate  Thought Process:  Coherent and Goal Directed  Orientation:  Full (Time, Place, and Person)  Thought Content:  symptoms events worries concerns  Suicidal Thoughts:  No  Homicidal Thoughts:  No  Memory:  Immediate;   Fair Recent;   Fair Remote;   Fair  Judgement:  Fair  Insight:  Present  Psychomotor Activity:  Restlessness  Concentration:  Fair  Recall:  [employer] of Knowledge:Fair  Language: Fair  Akathisia:  No  Handed:  Right  AIMS (if indicated):     Assets:  Desire for Improvement  Sleep:  Number of Hours: 6  Cognition: WNL  ADL's:  Intact   Have you used any form of tobacco in the last 30 days? (Cigarettes, Smokeless Tobacco, Cigars, and/or Pipes): Yes  Has this patient used any form of tobacco in the last 30 days? (Cigarettes, Smokeless Tobacco, Cigars, and/or Pipes) Yes, A prescription for an FDA-approved tobacco cessation medication was offered at discharge  and the patient refused  Mental Status Per Nursing Assessment::   On Admission:  NA  Current Mental Status by Physician: In full contact with reality. There are no active S/S of withdrawal. There are no active SI plans or intent. He is anxious about going to [facility] but states he knows he needs to do it. He states he is committed to do this work for himself and his family   Loss Factors: NA  Historical Factors: NA  Risk Reduction Factors:   Sense of responsibility to family  Continued Clinical Symptoms:  Severe Anxiety and/or Agitation Alcohol/Substance Abuse/Dependencies  Cognitive Features That Contribute To Risk:  Closed-mindedness, Polarized thinking and Thought constriction (tunnel vision)    Suicide Risk:  Minimal: No identifiable suicidal ideation.  Patients presenting with no risk factors but with morbid ruminations; may be classified as minimal risk based on the severity of the depressive symptoms  Principal Problem: Polysubstance dependence including opioid type drug, continuous use Discharge Diagnoses:  Patient Active Problem List   Diagnosis Date Noted  . GAD (generalized anxiety disorder) [F41.1] 01/18/2015  . Polysubstance dependence including opioid type drug, continuous use [F11.229] 01/13/2015  . Substance induced mood disorder [F19.94] 01/12/2015  . Chronic pain of right ankle [M25.571, G89.29] 05/20/2014  . Adjustment disorder with anxious mood [F43.22] 05/18/2014  . Asthma with acute exacerbation [J45.901] 05/18/2014  . Pain in joint, lower leg [M25.569] 05/18/2014  . Status asthmaticus with COPD (chronic obstructive pulmonary disease) [J44.9] 03/28/2014  . Asthma exacerbation [J45.901] 01/05/2013  . Status asthmaticus [J45.902] 01/05/2013  Follow-up Information    Follow up with [facility] On 01/20/2015.   Why:  You have been accepted for admission on this date at 8:00AM. Make sure to bring photo ID and medication supply. (accepted per [facility]  in admissions). MRN: [id number]   Contact information:   [address] [place], [place] [address] Phone: [phone] Fax: [phone]      [facility] with [facility]   Why:  If interested in outpatient medication management/therapy/SA Intensive Outpatient, please contact Zollie Beckers ext [phone] to schedule assessment for services after completing inpatient treatment.    Contact information:   [address] [place], [place] [address] Phone: [phone] Fax: [phone]      Plan Of Care/Follow-up recommendations:  Activity:  as tolerated Diet:  heart healthy diet Follow up [facility] and [facility] as above Is patient on multiple antipsychotic therapies at discharge:  No Do you recommend tapering to monotherapy for antipsychotics?  NA   Has Patient had three or more failed trials of antipsychotic monotherapy by history:  No  Recommended Plan for Multiple Antipsychotic Therapies: NA    Danny Hale A 01/19/2015, 6:46 PM

## 2015-01-19 NOTE — BHH Group Notes (Signed)
[place] LCSW Group Therapy  01/19/2015 4:25 PM  Type of Therapy:  Group Therapy  Participation Level:  Active  Participation Quality:  Attentive  Affect:  Appropriate  Cognitive:  Alert and Oriented  Insight:  Engaged  Engagement in Therapy:  Engaged  Modes of Intervention:  Confrontation, Discussion, Education, Exploration, Problem-solving, Rapport Building, Socialization and Support  Summary of Progress/Problems: Emotion Regulation: This group focused on both positive and negative emotion identification and allowed group members to process ways to identify feelings, regulate negative emotions, and find healthy ways to manage internal/external emotions. Group members were asked to reflect on a time when their reaction to an emotion led to a negative outcome and explored how alternative responses using emotion regulation would have benefited them. Group members were also asked to discuss a time when emotion regulation was utilized when a negative emotion was experienced. Danny Hale was attentive and engaged during today's processing group. He shared that he plans to follow-up at [facility] and is nervous that "I have one more in me." Pt reports that he has not felt real emotions in over 20 years due to substance abuse and continues to struggle in managing his anxiety. " I have a lot of people depending on me." Pt shared that AA/NA is not for him but he is interested [facility] for [facility] support and group therapy after he completes inpatient treatment.    [place], Nivedita Mirabella LCSWA  01/19/2015, 4:25 PM

## 2015-01-19 NOTE — Progress Notes (Signed)
Patient ID: Danny Hale, male   DOB: Jan 29, 1976, 39 y.o.   MRN: [id number] Discharge instructions discussed with patient.  Patient verbalized understanding of instructions.  Patient to go to [facility] in AM by 8 AM.

## 2015-01-19 NOTE — Progress Notes (Signed)
Recreation Therapy Notes  Date: 04.06.2016 Time: 9:30am Location: [address] Group Room   Group Topic: Stress Management  Goal Area(s) Addresses:  Patient will actively participate in stress management techniques presented during session.   Behavioral Response: Did not attend.   Laureen Ochs Chayla Shands, LRT/CTRS  Chanta Bauers L 01/19/2015 2:17 PM

## 2015-01-19 NOTE — Progress Notes (Signed)
  [facility] Adult Case Management Discharge Plan :  Will you be returning to the same living situation after discharge:  No.pt accepted to [facility] [place] on Thursday 01/20/15 At discharge, do you have transportation home?: Yes,  pt's mother picking him up at 7:15AM on Thursday. Do you have the ability to pay for your medications: Yes,  mental health  Release of information consent forms completed and submitted to Medical Records by CSW.  Patient to Follow up at: Follow-up Information    Follow up with [facility] On 01/20/2015.   Why:  You have been accepted for admission on this date at 8:00AM. Make sure to bring photo ID and medication supply. (accepted per [facility] in admissions). MRN: [id number]   Contact information:   [address] [place], [place] [address] Phone: [phone] Fax: [phone]      [facility] with [facility]   Why:  If interested in outpatient medication management/therapy/SA Intensive Outpatient, please contact Zollie Beckers ext [phone] to schedule assessment for services after completing inpatient treatment.    Contact information:   [address] [place], [place] [address] Phone: [phone] Fax: [phone]      Patient denies SI/HI: Yes,  during group/self report.     Safety Planning and Suicide Prevention discussed: Yes,  SPE completed with pt's fiance. SPI pamphlet provided to pt and he was encouraged to share information with support network, ask questions, and talk about any concerns relating to SPE.  Have you used any form of tobacco in the last 30 days? (Cigarettes, Smokeless Tobacco, Cigars, and/or Pipes): Yes  Has patient been referred to the Quitline?: Yes, faxed on 01/19/15  Smart, [place]  01/19/2015, 11:25 AM

## 2015-01-20 NOTE — Progress Notes (Signed)
Patient discharge instructions reviewed and patient verified understanding.  Patient RX and med samples give to patient.  Patient belongings retrieved from locker 9.  Patient denies SI/HI and denies AVH.  Patient d/c to [facility].

## 2015-01-25 NOTE — Progress Notes (Signed)
Patient Discharge Instructions:  After Visit Summary (AVS):   Faxed to:  01/25/15 Discharge Summary Note:   Faxed to:  01/25/15 Psychiatric Admission Assessment Note:   Faxed to:  01/25/15 Suicide Risk Assessment - Discharge Assessment:   Faxed to:  01/25/15 Faxed/Sent to the Next Level Care provider:  01/25/15 Faxed to [facility] @ [phone] Faxed to [facility] [place], 01/25/2015, 2:03 PM

## 2015-08-19 ENCOUNTER — Ambulatory Visit: Payer: Self-pay | Attending: Internal Medicine

## 2015-08-19 ENCOUNTER — Encounter: Payer: Self-pay | Admitting: Internal Medicine

## 2015-08-23 ENCOUNTER — Telehealth: Payer: Self-pay | Admitting: Hematology and Oncology

## 2015-08-23 NOTE — Telephone Encounter (Signed)
Per Dr Vevelyn Royals needs to see a Dermatologist.

## 2015-08-24 ENCOUNTER — Other Ambulatory Visit: Payer: Self-pay | Admitting: Dermatology

## 2015-08-24 ENCOUNTER — Ambulatory Visit
Admission: RE | Admit: 2015-08-24 | Discharge: 2015-08-24 | Disposition: A | Discharge status: Home / Self Care | Payer: No Typology Code available for payment source | Source: Ambulatory Visit | Attending: Dermatology | Admitting: Dermatology

## 2015-08-24 DIAGNOSIS — C4402 Squamous cell carcinoma of skin of lip: Secondary | ICD-10-CM

## 2015-09-09 ENCOUNTER — Encounter (HOSPITAL_COMMUNITY): Payer: Self-pay | Admitting: *Deleted

## 2015-09-09 ENCOUNTER — Emergency Department (HOSPITAL_COMMUNITY)
Admission: EM | Admit: 2015-09-09 | Discharge: 2015-09-09 | Disposition: A | Discharge status: Home / Self Care | Payer: Self-pay | Attending: Emergency Medicine | Admitting: Emergency Medicine

## 2015-09-09 ENCOUNTER — Emergency Department (HOSPITAL_COMMUNITY): Payer: No Typology Code available for payment source

## 2015-09-09 ENCOUNTER — Other Ambulatory Visit: Payer: Self-pay

## 2015-09-09 DIAGNOSIS — K1379 Other lesions of oral mucosa: Secondary | ICD-10-CM | POA: Insufficient documentation

## 2015-09-09 DIAGNOSIS — J45909 Unspecified asthma, uncomplicated: Secondary | ICD-10-CM

## 2015-09-09 DIAGNOSIS — J441 Chronic obstructive pulmonary disease with (acute) exacerbation: Secondary | ICD-10-CM | POA: Insufficient documentation

## 2015-09-09 DIAGNOSIS — F17210 Nicotine dependence, cigarettes, uncomplicated: Secondary | ICD-10-CM | POA: Insufficient documentation

## 2015-09-09 DIAGNOSIS — E669 Obesity, unspecified: Secondary | ICD-10-CM | POA: Insufficient documentation

## 2015-09-09 DIAGNOSIS — Z79899 Other long term (current) drug therapy: Secondary | ICD-10-CM | POA: Insufficient documentation

## 2015-09-09 DIAGNOSIS — R000 Tachycardia, unspecified: Secondary | ICD-10-CM | POA: Insufficient documentation

## 2015-09-09 MED ORDER — IPRATROPIUM-ALBUTEROL 0.5-2.5 (3) MG/3ML IN SOLN
3.0000 mL | RESPIRATORY_TRACT | Status: AC
  Administered 2015-09-09 (×3): 3 mL via RESPIRATORY_TRACT
  Filled 2015-09-09: qty 9

## 2015-09-09 MED ORDER — AZITHROMYCIN 250 MG PO TABS: 250.0000 mg | ORAL_TABLET | Freq: Every day | ORAL | Status: DC

## 2015-09-09 MED ORDER — SODIUM CHLORIDE 0.9 % IV BOLUS (SEPSIS)
1000.0000 mL | Freq: Once | INTRAVENOUS | Status: AC
  Administered 2015-09-09: 1000 mL via INTRAVENOUS

## 2015-09-09 MED ORDER — PREDNISONE 20 MG PO TABS
40.0000 mg | ORAL_TABLET | Freq: Once | ORAL | Status: AC
  Administered 2015-09-09: 40 mg via ORAL
  Filled 2015-09-09: qty 2

## 2015-09-09 MED ORDER — DEXTROSE 5 % IV SOLN
500.0000 mg | Freq: Once | INTRAVENOUS | Status: AC
  Administered 2015-09-09: 500 mg via INTRAVENOUS
  Filled 2015-09-09: qty 500

## 2015-09-09 MED ORDER — ALBUTEROL SULFATE (2.5 MG/3ML) 0.083% IN NEBU
INHALATION_SOLUTION | RESPIRATORY_TRACT | Status: AC
  Filled 2015-09-09: qty 6

## 2015-09-09 MED ORDER — PREDNISONE 20 MG PO TABS: 40.0000 mg | ORAL_TABLET | Freq: Every day | ORAL | Status: DC

## 2015-09-09 MED ORDER — ALPRAZOLAM 0.25 MG PO TABS
1.0000 mg | ORAL_TABLET | Freq: Once | ORAL | Status: AC
  Administered 2015-09-09: 1 mg via ORAL
  Filled 2015-09-09: qty 4

## 2015-09-09 MED ORDER — ALBUTEROL SULFATE (2.5 MG/3ML) 0.083% IN NEBU: 2.5000 mg | INHALATION_SOLUTION | RESPIRATORY_TRACT | Status: DC | PRN

## 2015-09-09 MED ORDER — ALBUTEROL SULFATE (2.5 MG/3ML) 0.083% IN NEBU
5.0000 mg | INHALATION_SOLUTION | Freq: Once | RESPIRATORY_TRACT | Status: AC
  Administered 2015-09-09: 5 mg via RESPIRATORY_TRACT

## 2015-09-09 NOTE — ED Notes (Signed)
Pt has history of copd and asthma is here with complaints of sob and coughing up thick stuff.

## 2015-09-09 NOTE — ED Notes (Signed)
Patient left at this time with all belongings. 

## 2015-09-09 NOTE — ED Provider Notes (Signed)
CSN: ID:[id number]     Arrival date & time 09/09/15  1323 History   First MD Initiated Contact with Patient 09/09/15 1615     Chief Complaint  Patient presents with  . Shortness of Breath  . Cough     (Consider location/radiation/quality/duration/timing/severity/associated sxs/prior Treatment) Patient is a 39 y.o. male presenting with cough.  Cough Cough characteristics:  Productive Sputum characteristics:  Nondescript Severity:  Mild Onset quality:  Gradual Duration:  3 days Timing:  Constant Progression:  Worsening Chronicity:  Recurrent Smoker: yes   Relieved by:  None tried Worsened by:  Nothing tried Ineffective treatments:  None tried Associated symptoms: chills, rhinorrhea and shortness of breath   Associated symptoms: no chest pain and no fever     Past Medical History  Diagnosis Date  . Asthma   . Obesity   . COPD (chronic obstructive pulmonary disease) ([place])   . Emphysema   . Cigarette smoker    Past Surgical History  Procedure Laterality Date  . Ankle surgery     Family History  Problem Relation Age of Onset  . COPD Neg Hx   . Birth defects Sister    Social History  Substance Use Topics  . Smoking status: Current Every Day Smoker -- 0.25 packs/day    Types: Cigarettes  . Smokeless tobacco: Never Used  . Alcohol Use: Yes     Comment: occ    Review of Systems  Constitutional: Positive for chills. Negative for fever.  HENT: Positive for congestion, mouth sores, postnasal drip, rhinorrhea and sinus pressure. Negative for dental problem and ear discharge.   Eyes: Negative for pain.  Respiratory: Positive for cough and shortness of breath.   Cardiovascular: Negative for chest pain.  Gastrointestinal: Negative for abdominal pain.  Endocrine: Negative for polydipsia and polyuria.  Musculoskeletal: Negative for back pain.  All other systems reviewed and are negative.     Allergies  Review of patient's allergies indicates no known  allergies.  Home Medications   Prior to Admission medications   Medication Sig Start Date End Date Taking? Authorizing Provider  ALPRAZolam Duanne Moron) 0.5 MG tablet Take 0.5 mg by mouth 2 (two) times daily. 08/30/15  Yes Historical Provider, MD  amphetamine-dextroamphetamine (ADDERALL) 30 MG tablet Take 30 mg by mouth 2 (two) times daily. 09/06/15  Yes Historical Provider, MD  guaiFENesin (MUCINEX) 600 MG 12 hr tablet Take 600 mg by mouth 2 (two) times daily as needed for cough.   Yes Historical Provider, MD  mirtazapine (REMERON) 15 MG tablet Take 1 tablet (15 mg total) by mouth at bedtime. For depression and sleep Patient taking differently: Take 15 mg by mouth at bedtime as needed (depression, sleep).  01/19/15  Yes Shuvon B Rankin, NP  Pseudoephedrine-Ibuprofen (IBUPROFEN AND PSE COLD & SINUS) 30-200 MG TABS Take 1 tablet by mouth daily as needed (cough).   Yes Historical Provider, MD  traZODone (DESYREL) 300 MG tablet Take 1 tablet (300 mg total) by mouth at bedtime as needed for sleep. 01/19/15  Yes Nicholaus Bloom, MD  albuterol (PROVENTIL) (2.5 MG/3ML) 0.083% nebulizer solution Take 3 mLs (2.5 mg total) by nebulization every 4 (four) hours as needed for wheezing or shortness of breath. 09/09/15   Merrily Pew, MD  azithromycin (ZITHROMAX Z-PAK) 250 MG tablet Take 1 tablet (250 mg total) by mouth daily. 09/10/15   Merrily Pew, MD  predniSONE (DELTASONE) 20 MG tablet Take 2 tablets (40 mg total) by mouth daily with breakfast. 09/10/15   Merrily Pew,  MD   BP 122/69 mmHg  Pulse 106  Temp(Src) 98.3 F (36.8 C) (Oral)  Resp 21  Ht 6\' 1"  (1.854 m)  Wt 320 lb (145.151 kg)  BMI 42.23 kg/m2  SpO2 96% Physical Exam  Constitutional: He is oriented to person, place, and time. He appears well-developed and well-nourished.  HENT:  Head: Normocephalic and atraumatic.  Eyes: Conjunctivae are normal. Pupils are equal, round, and reactive to light.  Neck: Normal range of motion.  Cardiovascular:  Tachycardia present.   Pulmonary/Chest: No accessory muscle usage. Tachypnea noted. No respiratory distress. He has decreased breath sounds. He has wheezes.  Abdominal: Soft. Bowel sounds are normal. He exhibits no distension. There is no tenderness.  Musculoskeletal: Normal range of motion. He exhibits no edema or tenderness.  Neurological: He is alert and oriented to person, place, and time.  Skin: Skin is warm and dry.  Nursing note and vitals reviewed.   ED Course  Procedures (including critical care time) Labs Review Labs Reviewed - No data to display  Imaging Review Dg Chest 2 View  09/09/2015  CLINICAL DATA:  Cough and nasal congestion. Difficulty breathing. Shortness of breath. EXAM: CHEST - 2 VIEW COMPARISON:  Two-view chest x-ray 08/24/2015 FINDINGS: The heart size is normal. No focal airspace disease is present. There is no edema or effusion to suggest failure. Multiple healed left-sided rib fractures are noted. The visualized soft tissues and bony thorax are otherwise unremarkable. IMPRESSION: 1. No acute cardiopulmonary disease. 2. Healed left-sided rib fractures. Electronically Signed   By: San Morelle M.D.   On: 09/09/2015 15:28   I have personally reviewed and evaluated these images and lab results as part of my medical decision-making.   EKG Interpretation None      MDM   Final diagnoses:  Asthma, unspecified asthma severity, uncomplicated     39 year old male here with likely asthma versus COPD exacerbation. Treated with steroids and antibiotics and some breathing treatments. Chest x-ray was negative. Patient is initially tachycardic however this improved during his emergency department stay. Doubt that he has a pulmonary embolus or other acute causes for his symptoms. We'll DC on steroids, antibiotics and every 4 hours nebulized albuterol and then every hour as needed. Will return here if he is having to use excessive amounts of nebulized treatments or any  other worsening of symptoms.     Merrily Pew, MD 09/09/15 [phone]

## 2015-11-04 ENCOUNTER — Encounter: Payer: Self-pay | Admitting: Emergency Medicine

## 2015-11-04 ENCOUNTER — Emergency Department
Admission: EM | Admit: 2015-11-04 | Discharge: 2015-11-04 | Disposition: A | Discharge status: Home / Self Care | Payer: No Typology Code available for payment source | Attending: Emergency Medicine | Admitting: Emergency Medicine

## 2015-11-04 ENCOUNTER — Emergency Department: Payer: No Typology Code available for payment source

## 2015-11-04 DIAGNOSIS — Z792 Long term (current) use of antibiotics: Secondary | ICD-10-CM | POA: Insufficient documentation

## 2015-11-04 DIAGNOSIS — J40 Bronchitis, not specified as acute or chronic: Secondary | ICD-10-CM

## 2015-11-04 DIAGNOSIS — F17210 Nicotine dependence, cigarettes, uncomplicated: Secondary | ICD-10-CM | POA: Insufficient documentation

## 2015-11-04 DIAGNOSIS — Z79899 Other long term (current) drug therapy: Secondary | ICD-10-CM | POA: Insufficient documentation

## 2015-11-04 DIAGNOSIS — J441 Chronic obstructive pulmonary disease with (acute) exacerbation: Secondary | ICD-10-CM | POA: Insufficient documentation

## 2015-11-04 DIAGNOSIS — M722 Plantar fascial fibromatosis: Secondary | ICD-10-CM | POA: Insufficient documentation

## 2015-11-04 MED ORDER — PREDNISONE 20 MG PO TABS
ORAL_TABLET | ORAL | Status: AC
  Administered 2015-11-04: 60 mg via ORAL
  Filled 2015-11-04: qty 3

## 2015-11-04 MED ORDER — LEVOFLOXACIN 500 MG PO TABS: 500.0000 mg | ORAL_TABLET | Freq: Every day | ORAL | Status: DC

## 2015-11-04 MED ORDER — ALBUTEROL SULFATE HFA 108 (90 BASE) MCG/ACT IN AERS: 2.0000 | INHALATION_SPRAY | Freq: Four times a day (QID) | RESPIRATORY_TRACT | Status: DC | PRN

## 2015-11-04 MED ORDER — HYDROCOD POLST-CPM POLST ER 10-8 MG/5ML PO SUER
5.0000 mL | Freq: Once | ORAL | Status: AC
  Administered 2015-11-04: 5 mL via ORAL
  Filled 2015-11-04: qty 5

## 2015-11-04 MED ORDER — PREDNISONE 10 MG (21) PO TBPK: ORAL_TABLET | ORAL | Status: DC

## 2015-11-04 MED ORDER — HYDROCODONE-HOMATROPINE 5-1.5 MG/5ML PO SYRP: 5.0000 mL | ORAL_SOLUTION | Freq: Four times a day (QID) | ORAL | Status: DC | PRN

## 2015-11-04 MED ORDER — PREDNISONE 20 MG PO TABS
60.0000 mg | ORAL_TABLET | Freq: Once | ORAL | Status: AC
  Administered 2015-11-04: 60 mg via ORAL

## 2015-11-04 NOTE — ED Provider Notes (Signed)
[facility] Emergency Department Provider Note  ____________________________________________  Time seen: Approximately 9:26 PM  I have reviewed the triage vital signs and the nursing notes.   HISTORY  Chief Complaint Cough    HPI Danny Hale is a 40 y.o. male with history of asthma who reports having a cold pack and November. He's had persistent cough and congestion since then. He was seen in December and diagnosed with pneumonia. He thinks symptoms improved with treatment but have not resolved. They have worsened again the last few days. He does continue to smoke. He also complains of head congestion and hoarseness, and sore throat. He has had some diarrhea as well. He was taking amoxicillin that he had left over. He also complains of left heel pain, that is worse when initially walking.   Past Medical History  Diagnosis Date  . Asthma   . Obesity   . COPD (chronic obstructive pulmonary disease) ([place])   . Emphysema   . Cigarette smoker     Patient Active Problem List   Diagnosis Date Noted  . GAD (generalized anxiety disorder) 01/18/2015  . Polysubstance dependence including opioid type drug, continuous use ([place]) 01/13/2015  . Substance induced mood disorder ([place]) 01/12/2015  . Chronic pain of right ankle 05/20/2014  . Adjustment disorder with anxious mood 05/18/2014  . Asthma with acute exacerbation 05/18/2014  . Pain in joint, lower leg 05/18/2014  . Status asthmaticus with COPD (chronic obstructive pulmonary disease) ([place]) 03/28/2014  . Asthma exacerbation 01/05/2013  . Status asthmaticus 01/05/2013    Past Surgical History  Procedure Laterality Date  . Ankle surgery      Current Outpatient Rx  Name  Route  Sig  Dispense  Refill  . albuterol (PROVENTIL) (2.5 MG/3ML) 0.083% nebulizer solution   Nebulization   Take 3 mLs (2.5 mg total) by nebulization every 4 (four) hours as needed for wheezing or shortness of breath.   75 mL   0   . ALPRAZolam (XANAX) 0.5 MG tablet   Oral   Take 0.5 mg by mouth 2 (two) times daily.      1   . amphetamine-dextroamphetamine (ADDERALL) 30 MG tablet   Oral   Take 30 mg by mouth 2 (two) times daily.      0   . mirtazapine (REMERON) 15 MG tablet   Oral   Take 1 tablet (15 mg total) by mouth at bedtime. For depression and sleep Patient taking differently: Take 15 mg by mouth at bedtime as needed (depression, sleep).    30 tablet   0   . traZODone (DESYREL) 300 MG tablet   Oral   Take 1 tablet (300 mg total) by mouth at bedtime as needed for sleep.   30 tablet   0   . azithromycin (ZITHROMAX Z-PAK) 250 MG tablet   Oral   Take 1 tablet (250 mg total) by mouth daily.   4 tablet   0   . guaiFENesin (MUCINEX) 600 MG 12 hr tablet   Oral   Take 600 mg by mouth 2 (two) times daily as needed for cough.         Marland Kitchen HYDROcodone-homatropine (HYCODAN) 5-1.5 MG/5ML syrup   Oral   Take 5 mLs by mouth every 6 (six) hours as needed for cough.   120 mL   0   . levofloxacin (LEVAQUIN) 500 MG tablet   Oral   Take 1 tablet (500 mg total) by mouth daily.   10  tablet   0   . predniSONE (STERAPRED UNI-PAK 21 TAB) 10 MG (21) TBPK tablet      6 tablets on day 1, 5 tablets on day 2, 4 tablets on day 3, etc...   21 tablet   0   . Pseudoephedrine-Ibuprofen (IBUPROFEN AND PSE COLD & SINUS) 30-200 MG TABS   Oral   Take 1 tablet by mouth daily as needed (cough).           Allergies Review of patient's allergies indicates no known allergies.  Family History  Problem Relation Age of Onset  . COPD Neg Hx   . Birth defects Sister     Social History Social History  Substance Use Topics  . Smoking status: Current Every Day Smoker -- 0.25 packs/day    Types: Cigarettes  . Smokeless tobacco: Never Used  . Alcohol Use: No     Comment: occ    Review of Systems Constitutional: No fever/chills Eyes: No visual changes. ENT: sore throat. As per HPI Cardiovascular: Denies chest  pain. Respiratory:  shortness of breath, and wheezing, with chest soreness from coughing Gastrointestinal: No abdominal pain.  No nausea, no vomiting.  diarrhea.  No constipation. Genitourinary: Negative for dysuria. Skin: Negative for rash. Neurological: Negative for focal weakness or numbness. 10-point ROS otherwise negative.  ____________________________________________   PHYSICAL EXAM:  VITAL SIGNS: ED Triage Vitals  Enc Vitals Group     BP 11/04/15 2100 132/89 mmHg     Pulse Rate 11/04/15 2100 117     Resp 11/04/15 2100 20     Temp 11/04/15 2100 98.2 F (36.8 C)     Temp Source 11/04/15 2100 Oral     SpO2 11/04/15 2100 99 %     Weight 11/04/15 2100 310 lb (140.615 kg)     Height 11/04/15 2100 6\' 1"  (1.854 m)     Head Cir --      Peak Flow --      Pain Score 11/04/15 2101 8     Pain Loc --      Pain Edu? --      Excl. in [place]? --     Constitutional: Alert and oriented. Well appearing and in no acute distress. Eyes: Conjunctivae are normal. PERRL. EOMI. Ears:  Clear with normal landmarks. No erythema. Head: Atraumatic. Nose: No congestion/rhinnorhea. Mouth/Throat: Mucous membranes are moist.  Oropharynx non-erythematous. No lesions. Neck:  Supple.  No adenopathy.   Cardiovascular: Normal rate, regular rhythm. Grossly normal heart sounds.  Good peripheral circulation. Respiratory: Normal respiratory effort.  No retractions. Exp wheezing bilateral. Gastrointestinal: Soft and nontender. No distention. No abdominal bruits. No CVA tenderness. Musculoskeletal: Nml ROM of upper and lower extremity joints.  Tender on the left heel, medial calcaneal insertion Neurologic:  Normal speech and language. No gross focal neurologic deficits are appreciated. No gait instability. Skin:  Skin is warm, dry and intact. No rash noted. Psychiatric: Mood and affect are normal. Speech and behavior are normal.  ____________________________________________   LABS (all labs ordered are  listed, but only abnormal results are displayed)  Labs Reviewed - No data to display ____________________________________________  EKG   ____________________________________________  RADIOLOGY  CLINICAL DATA: Chronic cough for 3 months. Shortness of breath and chest tightness. Initial encounter.  EXAM: CHEST 2 VIEW  COMPARISON: Chest radiograph performed 09/09/2015  FINDINGS: The lungs are well-aerated. Mild peribronchial thickening is noted. There is no evidence of focal opacification, pleural effusion or pneumothorax.  The heart is normal in size; the  mediastinal contour is within normal limits. No acute osseous abnormalities are seen. Mild chronic left-sided rib deformities are seen.  IMPRESSION: Mild peribronchial thickening noted. Lungs otherwise clear.   Electronically Signed  By: Garald Balding M.D.  On: 11/04/2015 21:49  ____________________________________________   PROCEDURES  Procedure(s) performed: None  Critical Care performed: No  ____________________________________________   INITIAL IMPRESSION / ASSESSMENT AND PLAN / ED COURSE  Pertinent labs & imaging results that were available during my care of the patient were reviewed by me and considered in my medical decision making (see chart for details). 39 year old male with history of asthma presents with persistent cough and congestion over the last 2 months. Has been treated for bronchitis, possible pneumonia on November 25. Symptoms improved but did not resolve. They have worsened again over the last week. He does continue to smoke. His x-ray today reveals no infiltrate. He is given prednisone and Levaquin in the emergency room and will continue a one-week course. He is also given Hycodan cough syrup, and albuterol inhaler. He can follow-up with his physician for persistent symptoms, or urgent care as needed. He will return to emergency room for any concerns.     FINAL CLINICAL  IMPRESSION(S) / ED DIAGNOSES  Final diagnoses:  Bronchitis  Plantar fasciitis      Mortimer Fries, PA-C 11/04/15 2159  Nena Polio, MD 11/04/15 2201

## 2015-11-04 NOTE — ED Notes (Signed)
Pt returned to room, pillow given with splinting instructions to reduce coughing pain

## 2015-11-04 NOTE — ED Notes (Signed)
Patient reports cough since before Thanksgiving, not getting any better.

## 2015-11-04 NOTE — ED Notes (Signed)
Pt reports taking prescribed zanax and adderall today, as well as amoxicillin

## 2015-11-04 NOTE — Discharge Instructions (Signed)
Take antibiotic and prednisone as directed. Use inhaler and cough medicine as needed. Recommend stop smoking. Follow-up with your physician or urgent care as needed if not improving. Return to emergency room for any concerns.

## 2015-11-04 NOTE — ED Notes (Signed)
Patient transported to X-ray 

## 2015-12-18 ENCOUNTER — Emergency Department (HOSPITAL_COMMUNITY): Payer: Self-pay

## 2015-12-18 ENCOUNTER — Encounter (HOSPITAL_COMMUNITY): Payer: Self-pay | Admitting: Emergency Medicine

## 2015-12-18 ENCOUNTER — Emergency Department (HOSPITAL_COMMUNITY)
Admission: EM | Admit: 2015-12-18 | Discharge: 2015-12-18 | Disposition: A | Discharge status: Home / Self Care | Payer: Self-pay | Attending: Emergency Medicine | Admitting: Emergency Medicine

## 2015-12-18 DIAGNOSIS — M722 Plantar fascial fibromatosis: Secondary | ICD-10-CM

## 2015-12-18 DIAGNOSIS — E669 Obesity, unspecified: Secondary | ICD-10-CM | POA: Insufficient documentation

## 2015-12-18 DIAGNOSIS — Z79899 Other long term (current) drug therapy: Secondary | ICD-10-CM | POA: Insufficient documentation

## 2015-12-18 DIAGNOSIS — Z792 Long term (current) use of antibiotics: Secondary | ICD-10-CM | POA: Insufficient documentation

## 2015-12-18 DIAGNOSIS — J439 Emphysema, unspecified: Secondary | ICD-10-CM | POA: Insufficient documentation

## 2015-12-18 DIAGNOSIS — F17210 Nicotine dependence, cigarettes, uncomplicated: Secondary | ICD-10-CM | POA: Insufficient documentation

## 2015-12-18 DIAGNOSIS — M25811 Other specified joint disorders, right shoulder: Secondary | ICD-10-CM

## 2015-12-18 DIAGNOSIS — M7541 Impingement syndrome of right shoulder: Secondary | ICD-10-CM | POA: Insufficient documentation

## 2015-12-18 MED ORDER — HYDROCODONE-ACETAMINOPHEN 5-325 MG PO TABS
2.0000 | ORAL_TABLET | Freq: Once | ORAL | Status: AC
  Administered 2015-12-18: 2 via ORAL
  Filled 2015-12-18: qty 2

## 2015-12-18 MED ORDER — OXYCODONE-ACETAMINOPHEN 5-325 MG PO TABS: 1.0000 | ORAL_TABLET | Freq: Four times a day (QID) | ORAL | Status: DC | PRN

## 2015-12-18 NOTE — ED Provider Notes (Signed)
CSN: [id number]     Arrival date & time 12/18/15  1939 History  By signing my name below, I, Dora Sims, attest that this documentation has been prepared under the direction and in the presence of non-physician practitioner, Montine Circle, PA-C . Electronically Signed: Dora Sims, Scribe. 12/18/2015. 8:01 PM.     Chief Complaint  Patient presents with  . Foot Pain    The history is provided by the patient. No language interpreter was used.     Danny Hale is a 40 y.o. male with h/o left foot plantar fasciitis who presents to the Emergency Department with a chief complaint of sudden onset, constant, stabbing, 9/10, left foot pain for the past few weeks. He notes that his foot pain radiates from the arch of his left foot into his left heel. 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 He notes that his left foot pain is the most severe in the morning and reports that he is barely able to ambulate after he wakes up. He denies falling or any recent injury.  Pt additionally complains of sudden onset, constant, sharp, 9/10, right shoulder pain for the past few weeks. Pt states that he is unable to lift his right arm above his head and endorses difficulty scratching the back of his head with his right arm due to pain. He notes that his father had similar symptoms in his shoulder and eventually had rotator cuff surgery. Pt endorses pain exacerbation with palpation to his right shoulder. He reports that he has been taking 20-30 ibuprofen a day for his foot and shoulder pains. Pt has no known allergies to medications. He denies numbness or any other associated symptoms.    Past Medical History  Diagnosis Date  . Asthma   . Obesity   . COPD (chronic obstructive pulmonary disease) ([place])   . Emphysema   . Cigarette smoker    Past Surgical History  Procedure Laterality Date  . Ankle surgery     Family History  Problem  Relation Age of Onset  . COPD Neg Hx   . Birth defects Sister    Social History  Substance Use Topics  . Smoking status: Current Every Day Smoker -- 0.25 packs/day    Types: Cigarettes  . Smokeless tobacco: Never Used  . Alcohol Use: No     Comment: occ    Review of Systems  Constitutional: Negative for fever and chills.  Respiratory: Negative for shortness of breath.   Cardiovascular: Negative for chest pain.  Gastrointestinal: Negative for nausea, vomiting, diarrhea and constipation.  Genitourinary: Negative for dysuria.  Musculoskeletal: Positive for myalgias and arthralgias.      Allergies  Review of patient's allergies indicates no known allergies.  Home Medications   Prior to Admission medications   Medication Sig Start Date End Date Taking? Authorizing Provider  albuterol (PROVENTIL HFA;VENTOLIN HFA) 108 (90 Base) MCG/ACT inhaler Inhale 2 puffs into the lungs every 6 (six) hours as needed for wheezing or shortness of breath. 11/04/15   Mortimer Fries, PA-C  ALPRAZolam Duanne Moron) 0.5 MG tablet Take 0.5 mg by mouth 2 (two) times daily. 08/30/15   Historical Provider, MD  amphetamine-dextroamphetamine (ADDERALL) 30 MG tablet Take 30 mg by mouth 2 (two) times daily. 09/06/15   Historical Provider, MD  azithromycin (ZITHROMAX Z-PAK) 250 MG tablet Take 1 tablet (250 mg total) by mouth daily. 09/10/15   Merrily Pew, MD  guaiFENesin (MUCINEX) 600 MG 12 hr tablet Take 600 mg by mouth 2 (two) times daily as needed for cough.    Historical Provider, MD  HYDROcodone-homatropine (HYCODAN) 5-1.5 MG/5ML syrup Take 5 mLs by mouth every 6 (six) hours as needed for cough. 11/04/15   Mortimer Fries, PA-C  levofloxacin (LEVAQUIN) 500 MG tablet Take 1 tablet (500 mg total) by mouth daily. 11/04/15   Mortimer Fries, PA-C  mirtazapine (REMERON) 15 MG tablet Take 1 tablet (15 mg total) by mouth at bedtime. For depression and sleep Patient taking differently: Take 15 mg by mouth at bedtime as needed  (depression, sleep).  01/19/15   Shuvon B Rankin, NP  predniSONE (STERAPRED UNI-PAK 21 TAB) 10 MG (21) TBPK tablet 6 tablets on day 1, 5 tablets on day 2, 4 tablets on day 3, etc... 11/04/15   Mortimer Fries, PA-C  Pseudoephedrine-Ibuprofen (IBUPROFEN AND PSE COLD & SINUS) 30-200 MG TABS Take 1 tablet by mouth daily as needed (cough).    Historical Provider, MD  traZODone (DESYREL) 300 MG tablet Take 1 tablet (300 mg total) by mouth at bedtime as needed for sleep. 01/19/15   Nicholaus Bloom, MD   There were no vitals taken for this visit. Physical Exam   Physical Exam  Constitutional: Pt appears well-developed and well-nourished. No distress.  HENT:  Head: Normocephalic and atraumatic.  Eyes: Conjunctivae are normal.  Neck: Normal range of motion.  Cardiovascular: Normal rate, regular rhythm and intact distal pulses.   Capillary refill < 3 sec  Pulmonary/Chest: Effort normal and breath sounds normal.  Musculoskeletal: Pt exhibits tenderness at the right shoulder. No bony abnormality or deformity. Also tender along the plantar surface of the left foot. No bony abnormality or deformity. Pt exhibits no edema.  ROM: shoulder: 4/5 left foot 5/5   Neurological: Pt  is alert. Coordination normal.  Sensation 5/5 throughout Strength shoulder: 4/5 limited by pain and foot 5/5  Skin: Skin is warm and dry. Pt is not diaphoretic.  No tenting of the skin  Psychiatric: Pt has a normal mood and affect.  Nursing note and vitals reviewed.   ED Course  Procedures (including critical care time)  DIAGNOSTIC STUDIES: Oxygen Saturation is 97% on RA, normal by my interpretation.    COORDINATION OF CARE: 8:01 PM Will administer hydrocodone. Will order DG Foot Complete Left. Discussed treatment plan with pt at bedside and pt agreed to plan.   Imaging Review Dg Foot Complete Left  12/18/2015  CLINICAL DATA:  Left heel pain.  No known injury. EXAM: LEFT FOOT - COMPLETE 3+ VIEW COMPARISON:  None. FINDINGS: There is  no evidence of fracture or dislocation. There is no evidence of arthropathy or other focal bone abnormality. No evidence of calcaneal bone spur. Soft tissues are unremarkable. IMPRESSION: Negative. Electronically Signed   By: Earle Gell M.D.   On: 12/18/2015 21:18   I have personally reviewed and evaluated these images as part of my medical decision-making.    MDM   Final diagnoses:  Plantar fasciitis  Shoulder impingement, right    Patient symptoms are consistent with plantar fasciitis and right shoulder impingement syndrome. Recommend stretching, exercise, and orthopedic follow-up. Patient understands and agrees to plan. He is stable and ready for discharge.  I personally performed the services described in this documentation, which was scribed in my presence. The recorded information has been reviewed and is accurate.        Montine Circle, PA-C 12/18/15 [id number]  Fredia Sorrow, MD 12/21/15  [id number] 

## 2015-12-18 NOTE — ED Notes (Addendum)
Per pt, he has pain at the arch of his foot through the heel in his left foot. The pain in the worse in the morning.  Pt also c/o right shoulder pain. Denies fall.

## 2015-12-18 NOTE — Discharge Instructions (Signed)
Impingement Syndrome, Rotator Cuff, Bursitis With Rehab °Impingement syndrome is a condition that involves inflammation of the tendons of the rotator cuff and the subacromial bursa, that causes pain in the shoulder. The rotator cuff consists of four tendons and muscles that control much of the shoulder and upper arm function. The subacromial bursa is a fluid filled sac that helps reduce friction between the rotator cuff and one of the bones of the shoulder (acromion). Impingement syndrome is usually an overuse injury that causes swelling of the bursa (bursitis), swelling of the tendon (tendonitis), and/or a tear of the tendon (strain). Strains are classified into three categories. Grade 1 strains cause pain, but the tendon is not lengthened. Grade 2 strains include a lengthened ligament, due to the ligament being stretched or partially ruptured. With grade 2 strains there is still function, although the function may be decreased. Grade 3 strains include a complete tear of the tendon or muscle, and function is usually impaired. °SYMPTOMS  °· Pain around the shoulder, often at the outer portion of the upper arm. °· Pain that gets worse with shoulder function, especially when reaching overhead or lifting. °· Sometimes, aching when not using the arm. °· Pain that wakes you up at night. °· Sometimes, tenderness, swelling, warmth, or redness over the affected area. °· Loss of strength. °· Limited motion of the shoulder, especially reaching behind the back (to the back pocket or to unhook bra) or across your body. °· Crackling sound (crepitation) when moving the arm. °· Biceps tendon pain and inflammation (in the front of the shoulder). Worse when bending the elbow or lifting. °CAUSES  °Impingement syndrome is often an overuse injury, in which chronic (repetitive) motions cause the tendons or bursa to become inflamed. A strain occurs when a force is paced on the tendon or muscle that is greater than it can withstand.  Common mechanisms of injury include: °Stress from sudden increase in duration, frequency, or intensity of training. °· Direct hit (trauma) to the shoulder. °· Aging, erosion of the tendon with normal use. °· Bony bump on shoulder (acromial spur). °RISK INCREASES WITH: °· Contact sports (football, wrestling, boxing). °· Throwing sports (baseball, tennis, volleyball). °· Weightlifting and bodybuilding. °· Heavy labor. °· Previous injury to the rotator cuff, including impingement. °· Poor shoulder strength and flexibility. °· Failure to warm up properly before activity. °· Inadequate protective equipment. °· Old age. °· Bony bump on shoulder (acromial spur). °PREVENTION  °· Warm up and stretch properly before activity. °· Allow for adequate recovery between workouts. °· Maintain physical fitness: °¨ Strength, flexibility, and endurance. °¨ Cardiovascular fitness. °· Learn and use proper exercise technique. °PROGNOSIS  °If treated properly, impingement syndrome usually goes away within 6 weeks. Sometimes surgery is required.  °RELATED COMPLICATIONS  °· Longer healing time if not properly treated, or if not given enough time to heal. °· Recurring symptoms, that result in a chronic condition. °· Shoulder stiffness, frozen shoulder, or loss of motion. °· Rotator cuff tendon tear. °· Recurring symptoms, especially if activity is resumed too soon, with overuse, with a direct blow, or when using poor technique. °TREATMENT  °Treatment first involves the use of ice and medicine, to reduce pain and inflammation. The use of strengthening and stretching exercises may help reduce pain with activity. These exercises may be performed at home or with a therapist. If non-surgical treatment is unsuccessful after more than 6 months, surgery may be advised. After surgery and rehabilitation, activity is usually possible in 3 months.  °  MEDICATION  If pain medicine is needed, nonsteroidal anti-inflammatory medicines (aspirin and  ibuprofen), or other minor pain relievers (acetaminophen), are often advised.  Do not take pain medicine for 7 days before surgery.  Prescription pain relievers may be given, if your caregiver thinks they are needed. Use only as directed and only as much as you need.  Corticosteroid injections may be given by your caregiver. These injections should be reserved for the most serious cases, because they may only be given a certain number of times. HEAT AND COLD  Cold treatment (icing) should be applied for 10 to 15 minutes every 2 to 3 hours for inflammation and pain, and immediately after activity that aggravates your symptoms. Use ice packs or an ice massage.  Heat treatment may be used before performing stretching and strengthening activities prescribed by your caregiver, physical therapist, or athletic trainer. Use a heat pack or a warm water soak. SEEK MEDICAL CARE IF:   Symptoms get worse or do not improve in 4 to 6 weeks, despite treatment.  New, unexplained symptoms develop. (Drugs used in treatment may produce side effects.) EXERCISES  RANGE OF MOTION (ROM) AND STRETCHING EXERCISES - Impingement Syndrome (Rotator Cuff  Tendinitis, Bursitis) These exercises may help you when beginning to rehabilitate your injury. Your symptoms may go away with or without further involvement from your physician, physical therapist or athletic trainer. While completing these exercises, remember:   Restoring tissue flexibility helps normal motion to return to the joints. This allows healthier, less painful movement and activity.  An effective stretch should be held for at least 30 seconds.  A stretch should never be painful. You should only feel a gentle lengthening or release in the stretched tissue. STRETCH - Flexion, Standing  Stand with good posture. With an underhand grip on your right / left hand, and an overhand grip on the opposite hand, grasp a broomstick or cane so that your hands are a  little more than shoulder width apart.  Keeping your right / left elbow straight and shoulder muscles relaxed, push the stick with your opposite hand, to raise your right / left arm in front of your body and then overhead. Raise your arm until you feel a stretch in your right / left shoulder, but before you have increased shoulder pain.  Try to avoid shrugging your right / left shoulder as your arm rises, by keeping your shoulder blade tucked down and toward your mid-back spine. Hold for __________ seconds.  Slowly return to the starting position. Repeat __________ times. Complete this exercise __________ times per day. STRETCH - Abduction, Supine  Lie on your back. With an underhand grip on your right / left hand and an overhand grip on the opposite hand, grasp a broomstick or cane so that your hands are a little more than shoulder width apart.  Keeping your right / left elbow straight and your shoulder muscles relaxed, push the stick with your opposite hand, to raise your right / left arm out to the side of your body and then overhead. Raise your arm until you feel a stretch in your right / left shoulder, but before you have increased shoulder pain.  Try to avoid shrugging your right / left shoulder as your arm rises, by keeping your shoulder blade tucked down and toward your mid-back spine. Hold for __________ seconds.  Slowly return to the starting position. Repeat __________ times. Complete this exercise __________ times per day. ROM - Flexion, Active-Assisted  Lie on your back.  You may bend your knees for comfort.  Grasp a broomstick or cane so your hands are about shoulder width apart. Your right / left hand should grip the end of the stick, so that your hand is positioned "thumbs-up," as if you were about to shake hands.  Using your healthy arm to lead, raise your right / left arm overhead, until you feel a gentle stretch in your shoulder. Hold for __________ seconds.  Use the stick  to assist in returning your right / left arm to its starting position. Repeat __________ times. Complete this exercise __________ times per day.  ROM - Internal Rotation, Supine   Lie on your back on a firm surface. Place your right / left elbow about 60 degrees away from your side. Elevate your elbow with a folded towel, so that the elbow and shoulder are the same height.  Using a broomstick or cane and your strong arm, pull your right / left hand toward your body until you feel a gentle stretch, but no increase in your shoulder pain. Keep your shoulder and elbow in place throughout the exercise.  Hold for __________ seconds. Slowly return to the starting position. Repeat __________ times. Complete this exercise __________ times per day. STRETCH - Internal Rotation  Place your right / left hand behind your back, palm up.  Throw a towel or belt over your opposite shoulder. Grasp the towel with your right / left hand.  While keeping an upright posture, gently pull up on the towel, until you feel a stretch in the front of your right / left shoulder.  Avoid shrugging your right / left shoulder as your arm rises, by keeping your shoulder blade tucked down and toward your mid-back spine.  Hold for __________ seconds. Release the stretch, by lowering your healthy hand. Repeat __________ times. Complete this exercise __________ times per day. ROM - Internal Rotation   Using an underhand grip, grasp a stick behind your back with both hands.  While standing upright with good posture, slide the stick up your back until you feel a mild stretch in the front of your shoulder.  Hold for __________ seconds. Slowly return to your starting position. Repeat __________ times. Complete this exercise __________ times per day.  STRETCH - Posterior Shoulder Capsule   Stand or sit with good posture. Grasp your right / left elbow and draw it across your chest, keeping it at the same height as your  shoulder.  Pull your elbow, so your upper arm comes in closer to your chest. Pull until you feel a gentle stretch in the back of your shoulder.  Hold for __________ seconds. Repeat __________ times. Complete this exercise __________ times per day. STRENGTHENING EXERCISES - Impingement Syndrome (Rotator Cuff Tendinitis, Bursitis) These exercises may help you when beginning to rehabilitate your injury. They may resolve your symptoms with or without further involvement from your physician, physical therapist or athletic trainer. While completing these exercises, remember:  Muscles can gain both the endurance and the strength needed for everyday activities through controlled exercises.  Complete these exercises as instructed by your physician, physical therapist or athletic trainer. Increase the resistance and repetitions only as guided.  You may experience muscle soreness or fatigue, but the pain or discomfort you are trying to eliminate should never worsen during these exercises. If this pain does get worse, stop and make sure you are following the directions exactly. If the pain is still present after adjustments, discontinue the exercise until you can discuss  the trouble with your clinician.  During your recovery, avoid activity or exercises which involve actions that place your injured hand or elbow above your head or behind your back or head. These positions stress the tissues which you are trying to heal. STRENGTH - Scapular Depression and Adduction   With good posture, sit on a firm chair. Support your arms in front of you, with pillows, arm rests, or on a table top. Have your elbows in line with the sides of your body.  Gently draw your shoulder blades down and toward your mid-back spine. Gradually increase the tension, without tensing the muscles along the top of your shoulders and the back of your neck.  Hold for __________ seconds. Slowly release the tension and relax your muscles  completely before starting the next repetition.  After you have practiced this exercise, remove the arm support and complete the exercise in standing as well as sitting position. Repeat __________ times. Complete this exercise __________ times per day.  STRENGTH - Shoulder Abductors, Isometric  With good posture, stand or sit about 4-6 inches from a wall, with your right / left side facing the wall.  Bend your right / left elbow. Gently press your right / left elbow into the wall. Increase the pressure gradually, until you are pressing as hard as you can, without shrugging your shoulder or increasing any shoulder discomfort.  Hold for __________ seconds.  Release the tension slowly. Relax your shoulder muscles completely before you begin the next repetition. Repeat __________ times. Complete this exercise __________ times per day.  STRENGTH - External Rotators, Isometric  Keep your right / left elbow at your side and bend it 90 degrees.  Step into a door frame so that the outside of your right / left wrist can press against the door frame without your upper arm leaving your side.  Gently press your right / left wrist into the door frame, as if you were trying to swing the back of your hand away from your stomach. Gradually increase the tension, until you are pressing as hard as you can, without shrugging your shoulder or increasing any shoulder discomfort.  Hold for __________ seconds.  Release the tension slowly. Relax your shoulder muscles completely before you begin the next repetition. Repeat __________ times. Complete this exercise __________ times per day.  STRENGTH - Supraspinatus   Stand or sit with good posture. Grasp a __________ weight, or an exercise band or tubing, so that your hand is "thumbs-up," like you are shaking hands.  Slowly lift your right / left arm in a "V" away from your thigh, diagonally into the space between your side and straight ahead. Lift your hand to  shoulder height or as far as you can, without increasing any shoulder pain. At first, many people do not lift their hands above shoulder height.  Avoid shrugging your right / left shoulder as your arm rises, by keeping your shoulder blade tucked down and toward your mid-back spine.  Hold for __________ seconds. Control the descent of your hand, as you slowly return to your starting position. Repeat __________ times. Complete this exercise __________ times per day.  STRENGTH - External Rotators  Secure a rubber exercise band or tubing to a fixed object (table, pole) so that it is at the same height as your right / left elbow when you are standing or sitting on a firm surface.  Stand or sit so that the secured exercise band is at your uninjured side.  [employer]  your right / left elbow 90 degrees. Place a folded towel or small pillow under your right / left arm, so that your elbow is a few inches away from your side.  Keeping the tension on the exercise band, pull it away from your body, as if pivoting on your elbow. Be sure to keep your body steady, so that the movement is coming only from your rotating shoulder.  Hold for __________ seconds. Release the tension in a controlled manner, as you return to the starting position. Repeat __________ times. Complete this exercise __________ times per day.  STRENGTH - Internal Rotators   Secure a rubber exercise band or tubing to a fixed object (table, pole) so that it is at the same height as your right / left elbow when you are standing or sitting on a firm surface.  Stand or sit so that the secured exercise band is at your right / left side.  Bend your elbow 90 degrees. Place a folded towel or small pillow under your right / left arm so that your elbow is a few inches away from your side.  Keeping the tension on the exercise band, pull it across your body, toward your stomach. Be sure to keep your body steady, so that the movement is coming only from  your rotating shoulder.  Hold for __________ seconds. Release the tension in a controlled manner, as you return to the starting position. Repeat __________ times. Complete this exercise __________ times per day.  STRENGTH - Scapular Protractors, Standing   Stand arms length away from a wall. Place your hands on the wall, keeping your elbows straight.  Begin by dropping your shoulder blades down and toward your mid-back spine.  To strengthen your protractors, keep your shoulder blades down, but slide them forward on your rib cage. It will feel as if you are lifting the back of your rib cage away from the wall. This is a subtle motion and can be challenging to complete. Ask your caregiver for further instruction, if you are not sure you are doing the exercise correctly.  Hold for __________ seconds. Slowly return to the starting position, resting the muscles completely before starting the next repetition. Repeat __________ times. Complete this exercise __________ times per day. STRENGTH - Scapular Protractors, Supine  Lie on your back on a firm surface. Extend your right / left arm straight into the air while holding a __________ weight in your hand.  Keeping your head and back in place, lift your shoulder off the floor.  Hold for __________ seconds. Slowly return to the starting position, and allow your muscles to relax completely before starting the next repetition. Repeat __________ times. Complete this exercise __________ times per day. STRENGTH - Scapular Protractors, Quadruped  Get onto your hands and knees, with your shoulders directly over your hands (or as close as you can be, comfortably).  Keeping your elbows locked, lift the back of your rib cage up into your shoulder blades, so your mid-back rounds out. Keep your neck muscles relaxed.  Hold this position for __________ seconds. Slowly return to the starting position and allow your muscles to relax completely before starting the  next repetition. Repeat __________ times. Complete this exercise __________ times per day.  STRENGTH - Scapular Retractors  Secure a rubber exercise band or tubing to a fixed object (table, pole), so that it is at the height of your shoulders when you are either standing, or sitting on a firm armless chair.  With a  palm down grip, grasp an end of the band in each hand. Straighten your elbows and lift your hands straight in front of you, at shoulder height. Step back, away from the secured end of the band, until it becomes tense.  Squeezing your shoulder blades together, draw your elbows back toward your sides, as you bend them. Keep your upper arms lifted away from your body throughout the exercise.  Hold for __________ seconds. Slowly ease the tension on the band, as you reverse the directions and return to the starting position. Repeat __________ times. Complete this exercise __________ times per day. STRENGTH - Shoulder Extensors   Secure a rubber exercise band or tubing to a fixed object (table, pole) so that it is at the height of your shoulders when you are either standing, or sitting on a firm armless chair.  With a thumbs-up grip, grasp an end of the band in each hand. Straighten your elbows and lift your hands straight in front of you, at shoulder height. Step back, away from the secured end of the band, until it becomes tense.  Squeezing your shoulder blades together, pull your hands down to the sides of your thighs. Do not allow your hands to go behind you.  Hold for __________ seconds. Slowly ease the tension on the band, as you reverse the directions and return to the starting position. Repeat __________ times. Complete this exercise __________ times per day.  STRENGTH - Scapular Retractors and External Rotators   Secure a rubber exercise band or tubing to a fixed object (table, pole) so that it is at the height as your shoulders, when you are either standing, or sitting on a  firm armless chair.  With a palm down grip, grasp an end of the band in each hand. Bend your elbows 90 degrees and lift your elbows to shoulder height, at your sides. Step back, away from the secured end of the band, until it becomes tense.  Squeezing your shoulder blades together, rotate your shoulders so that your upper arms and elbows remain stationary, but your fists travel upward to head height.  Hold for __________ seconds. Slowly ease the tension on the band, as you reverse the directions and return to the starting position. Repeat __________ times. Complete this exercise __________ times per day.  STRENGTH - Scapular Retractors and External Rotators, Rowing   Secure a rubber exercise band or tubing to a fixed object (table, pole) so that it is at the height of your shoulders, when you are either standing, or sitting on a firm armless chair.  With a palm down grip, grasp an end of the band in each hand. Straighten your elbows and lift your hands straight in front of you, at shoulder height. Step back, away from the secured end of the band, until it becomes tense.  Step 1: Squeeze your shoulder blades together. Bending your elbows, draw your hands to your chest, as if you are rowing a boat. At the end of this motion, your hands and elbow should be at shoulder height and your elbows should be out to your sides.  Step 2: Rotate your shoulders, to raise your hands above your head. Your forearms should be vertical and your upper arms should be horizontal.  Hold for __________ seconds. Slowly ease the tension on the band, as you reverse the directions and return to the starting position. Repeat __________ times. Complete this exercise __________ times per day.  STRENGTH - Scapular Depressors  Find a sturdy chair  without wheels, such as a dining room chair.  Keeping your feet on the floor, and your hands on the chair arms, lift your bottom up from the seat, and lock your elbows.  Keeping  your elbows straight, allow gravity to pull your body weight down. Your shoulders will rise toward your ears.  Raise your body against gravity by drawing your shoulder blades down your back, shortening the distance between your shoulders and ears. Although your feet should always maintain contact with the floor, your feet should progressively support less body weight, as you get stronger.  Hold for __________ seconds. In a controlled and slow manner, lower your body weight to begin the next repetition. Repeat __________ times. Complete this exercise __________ times per day.    This information is not intended to replace advice given to you by your health care provider. Make sure you discuss any questions you have with your health care provider.   Document Released: 10/01/2005 Document Revised: 10/22/2014 Document Reviewed: 01/13/2009 [employer] Interactive Patient Education 2016 [employer] Plantar Fasciitis With Rehab The plantar fascia is a fibrous, ligament-like, soft-tissue structure that spans the bottom of the foot. Plantar fasciitis, also called heel spur syndrome, is a condition that causes pain in the foot due to inflammation of the tissue. SYMPTOMS   Pain and tenderness on the underneath side of the foot.  Pain that worsens with standing or walking. CAUSES  Plantar fasciitis is caused by irritation and injury to the plantar fascia on the underneath side of the foot. Common mechanisms of injury include:  Direct trauma to bottom of the foot.  Damage to a small nerve that runs under the foot where the main fascia attaches to the heel bone.  Stress placed on the plantar fascia due to bone spurs. RISK INCREASES WITH:   Activities that place stress on the plantar fascia (running, jumping, pivoting, or cutting).  Poor strength and flexibility.  Improperly fitted shoes.  Tight calf muscles.  Flat feet.  Failure to warm-up properly before  activity.  Obesity. PREVENTION  Warm up and stretch properly before activity.  Allow for adequate recovery between workouts.  Maintain physical fitness:  Strength, flexibility, and endurance.  Cardiovascular fitness.  Maintain a health body weight.  Avoid stress on the plantar fascia.  Wear properly fitted shoes, including arch supports for individuals who have flat feet. PROGNOSIS  If treated properly, then the symptoms of plantar fasciitis usually resolve without surgery. However, occasionally surgery is necessary. RELATED COMPLICATIONS   Recurrent symptoms that may result in a chronic condition.  Problems of the lower back that are caused by compensating for the injury, such as limping.  Pain or weakness of the foot during push-off following surgery.  Chronic inflammation, scarring, and partial or complete fascia tear, occurring more often from repeated injections. TREATMENT  Treatment initially involves the use of ice and medication to help reduce pain and inflammation. The use of strengthening and stretching exercises may help reduce pain with activity, especially stretches of the Achilles tendon. These exercises may be performed at home or with a therapist. Your caregiver may recommend that you use heel cups of arch supports to help reduce stress on the plantar fascia. Occasionally, corticosteroid injections are given to reduce inflammation. If symptoms persist for greater than 6 months despite non-surgical (conservative), then surgery may be recommended.  MEDICATION   If pain medication is necessary, then nonsteroidal anti-inflammatory medications, such as aspirin and ibuprofen, or other minor pain relievers, such as acetaminophen, are  often recommended.  Do not take pain medication within 7 days before surgery.  Prescription pain relievers may be given if deemed necessary by your caregiver. Use only as directed and only as much as you need.  Corticosteroid injections  may be given by your caregiver. These injections should be reserved for the most serious cases, because they may only be given a certain number of times. HEAT AND COLD  Cold treatment (icing) relieves pain and reduces inflammation. Cold treatment should be applied for 10 to 15 minutes every 2 to 3 hours for inflammation and pain and immediately after any activity that aggravates your symptoms. Use ice packs or massage the area with a piece of ice (ice massage).  Heat treatment may be used prior to performing the stretching and strengthening activities prescribed by your caregiver, physical therapist, or athletic trainer. Use a heat pack or soak the injury in warm water. SEEK IMMEDIATE MEDICAL CARE IF:  Treatment seems to offer no benefit, or the condition worsens.  Any medications produce adverse side effects. EXERCISES RANGE OF MOTION (ROM) AND STRETCHING EXERCISES - Plantar Fasciitis (Heel Spur Syndrome) These exercises may help you when beginning to rehabilitate your injury. Your symptoms may resolve with or without further involvement from your physician, physical therapist or athletic trainer. While completing these exercises, remember:   Restoring tissue flexibility helps normal motion to return to the joints. This allows healthier, less painful movement and activity.  An effective stretch should be held for at least 30 seconds.  A stretch should never be painful. You should only feel a gentle lengthening or release in the stretched tissue. RANGE OF MOTION - Toe Extension, Flexion  Sit with your right / left leg crossed over your opposite knee.  Grasp your toes and gently pull them back toward the top of your foot. You should feel a stretch on the bottom of your toes and/or foot.  Hold this stretch for __________ seconds.  Now, gently pull your toes toward the bottom of your foot. You should feel a stretch on the top of your toes and or foot.  Hold this stretch for __________  seconds. Repeat __________ times. Complete this stretch __________ times per day.  RANGE OF MOTION - Ankle Dorsiflexion, Active Assisted  Remove shoes and sit on a chair that is preferably not on a carpeted surface.  Place right / left foot under knee. Extend your opposite leg for support.  Keeping your heel down, slide your right / left foot back toward the chair until you feel a stretch at your ankle or calf. If you do not feel a stretch, slide your bottom forward to the edge of the chair, while still keeping your heel down.  Hold this stretch for __________ seconds. Repeat __________ times. Complete this stretch __________ times per day.  STRETCH - Gastroc, Standing  Place hands on wall.  Extend right / left leg, keeping the front knee somewhat bent.  Slightly point your toes inward on your back foot.  Keeping your right / left heel on the floor and your knee straight, shift your weight toward the wall, not allowing your back to arch.  You should feel a gentle stretch in the right / left calf. Hold this position for __________ seconds. Repeat __________ times. Complete this stretch __________ times per day. STRETCH - S[place]us, Standing  Place hands on wall.  Extend right / left leg, keeping the other knee somewhat bent.  Slightly point your toes inward on your back foot.  Keep your right / left heel on the floor, bend your back knee, and slightly shift your weight over the back leg so that you feel a gentle stretch deep in your back calf.  Hold this position for __________ seconds. Repeat __________ times. Complete this stretch __________ times per day. STRETCH - Gastrocsoleus, Standing  Note: This exercise can place a lot of stress on your foot and ankle. Please complete this exercise only if specifically instructed by your caregiver.   Place the ball of your right / left foot on a step, keeping your other foot firmly on the same step.  Hold on to the wall or a rail for  balance.  Slowly lift your other foot, allowing your body weight to press your heel down over the edge of the step.  You should feel a stretch in your right / left calf.  Hold this position for __________ seconds.  Repeat this exercise with a slight bend in your right / left knee. Repeat __________ times. Complete this stretch __________ times per day.  STRENGTHENING EXERCISES - Plantar Fasciitis (Heel Spur Syndrome)  These exercises may help you when beginning to rehabilitate your injury. They may resolve your symptoms with or without further involvement from your physician, physical therapist or athletic trainer. While completing these exercises, remember:   Muscles can gain both the endurance and the strength needed for everyday activities through controlled exercises.  Complete these exercises as instructed by your physician, physical therapist or athletic trainer. Progress the resistance and repetitions only as guided. STRENGTH - Towel Curls  Sit in a chair positioned on a non-carpeted surface.  Place your foot on a towel, keeping your heel on the floor.  Pull the towel toward your heel by only curling your toes. Keep your heel on the floor.  If instructed by your physician, physical therapist or athletic trainer, add ____________________ at the end of the towel. Repeat __________ times. Complete this exercise __________ times per day. STRENGTH - Ankle Inversion  Secure one end of a rubber exercise band/tubing to a fixed object (table, pole). Loop the other end around your foot just before your toes.  Place your fists between your knees. This will focus your strengthening at your ankle.  Slowly, pull your big toe up and in, making sure the band/tubing is positioned to resist the entire motion.  Hold this position for __________ seconds.  Have your muscles resist the band/tubing as it slowly pulls your foot back to the starting position. Repeat __________ times. Complete this  exercises __________ times per day.    This information is not intended to replace advice given to you by your health care provider. Make sure you discuss any questions you have with your health care provider.   Document Released: 10/01/2005 Document Revised: 02/15/2015 Document Reviewed: 01/13/2009 Elsevier Interactive Patient Education [employer].

## 2016-01-19 ENCOUNTER — Emergency Department (HOSPITAL_COMMUNITY)
Admission: EM | Admit: 2016-01-19 | Discharge: 2016-01-19 | Disposition: A | Discharge status: Home / Self Care | Payer: Self-pay | Attending: Emergency Medicine | Admitting: Emergency Medicine

## 2016-01-19 ENCOUNTER — Encounter (HOSPITAL_COMMUNITY): Payer: Self-pay | Admitting: *Deleted

## 2016-01-19 DIAGNOSIS — J449 Chronic obstructive pulmonary disease, unspecified: Secondary | ICD-10-CM | POA: Insufficient documentation

## 2016-01-19 DIAGNOSIS — Z859 Personal history of malignant neoplasm, unspecified: Secondary | ICD-10-CM | POA: Insufficient documentation

## 2016-01-19 DIAGNOSIS — Z79899 Other long term (current) drug therapy: Secondary | ICD-10-CM | POA: Insufficient documentation

## 2016-01-19 DIAGNOSIS — F17210 Nicotine dependence, cigarettes, uncomplicated: Secondary | ICD-10-CM | POA: Insufficient documentation

## 2016-01-19 DIAGNOSIS — K0889 Other specified disorders of teeth and supporting structures: Secondary | ICD-10-CM | POA: Insufficient documentation

## 2016-01-19 DIAGNOSIS — Z792 Long term (current) use of antibiotics: Secondary | ICD-10-CM | POA: Insufficient documentation

## 2016-01-19 DIAGNOSIS — H9202 Otalgia, left ear: Secondary | ICD-10-CM | POA: Insufficient documentation

## 2016-01-19 DIAGNOSIS — E669 Obesity, unspecified: Secondary | ICD-10-CM | POA: Insufficient documentation

## 2016-01-19 HISTORY — DX: Malignant (primary) neoplasm, unspecified: C80.1

## 2016-01-19 MED ORDER — PENICILLIN V POTASSIUM 500 MG PO TABS: 500.0000 mg | ORAL_TABLET | Freq: Three times a day (TID) | ORAL | Status: DC

## 2016-01-19 MED ORDER — IBUPROFEN 800 MG PO TABS: 800.0000 mg | ORAL_TABLET | Freq: Three times a day (TID) | ORAL | Status: DC

## 2016-01-19 NOTE — ED Notes (Signed)
Pt walked out of the room stating "keep your (swear word) prescriptions you (swear word).

## 2016-01-19 NOTE — ED Notes (Signed)
C/o left ear pain x 3-4 days. Now radiates to left jaw.

## 2016-01-19 NOTE — ED Provider Notes (Signed)
CSN: [id number]     Arrival date & time 01/19/16  1252 History  By signing my name below, I, Stephania Fragmin, attest that this documentation has been prepared under the direction and in the presence of Domenic Moras, PA-C. Electronically Signed: Stephania Fragmin, ED Scribe. 01/19/2016. 2:51 PM.    Chief Complaint  Patient presents with  . Otalgia   The history is provided by the patient. No language interpreter was used.    HPI Comments: Danny Hale is a 40 y.o. male with a history of asthma, obesity, COPD, emphysema, and cancer, who presents to the Emergency Department complaining of atraumatic, constant, sharp, left ear pain radiating to his left-sided jaw that began 3-4 days ago. Patient complains of associated ear popping and the sensation of dental pain when putting his teeth together. Patient reports a history of ruptured left eardrum and states this feels similar. He denies any recent swimming. He states he sometimes uses a Q-tip. He states he has tried ibuprofen, Tylenol, and Goody's Powders with no relief. He denies any known fever. Patient has known allergies to hydrocodone.   Past Medical History  Diagnosis Date  . Asthma   . Obesity   . COPD (chronic obstructive pulmonary disease) ([place])   . Emphysema   . Cigarette smoker   . Cancer [facility])    Past Surgical History  Procedure Laterality Date  . Ankle surgery     Family History  Problem Relation Age of Onset  . COPD Neg Hx   . Birth defects Sister    Social History  Substance Use Topics  . Smoking status: Current Every Day Smoker -- 3.00 packs/day    Types: Cigarettes, Cigars  . Smokeless tobacco: Never Used  . Alcohol Use: No     Comment: occ    Review of Systems  Constitutional: Negative for fever.  HENT: Positive for dental problem and ear pain.    Allergies  Hydrocodone  Home Medications   Prior to Admission medications   Medication Sig Start Date End Date Taking? Authorizing Provider  albuterol (PROVENTIL  HFA;VENTOLIN HFA) 108 (90 Base) MCG/ACT inhaler Inhale 2 puffs into the lungs every 6 (six) hours as needed for wheezing or shortness of breath. 11/04/15   Mortimer Fries, PA-C  ALPRAZolam Duanne Moron) 0.5 MG tablet Take 0.5 mg by mouth 2 (two) times daily. 08/30/15   Historical Provider, MD  amphetamine-dextroamphetamine (ADDERALL) 30 MG tablet Take 30 mg by mouth 2 (two) times daily. 09/06/15   Historical Provider, MD  azithromycin (ZITHROMAX Z-PAK) 250 MG tablet Take 1 tablet (250 mg total) by mouth daily. 09/10/15   Merrily Pew, MD  guaiFENesin (MUCINEX) 600 MG 12 hr tablet Take 600 mg by mouth 2 (two) times daily as needed for cough.    Historical Provider, MD  HYDROcodone-homatropine (HYCODAN) 5-1.5 MG/5ML syrup Take 5 mLs by mouth every 6 (six) hours as needed for cough. 11/04/15   Mortimer Fries, PA-C  levofloxacin (LEVAQUIN) 500 MG tablet Take 1 tablet (500 mg total) by mouth daily. 11/04/15   Mortimer Fries, PA-C  mirtazapine (REMERON) 15 MG tablet Take 1 tablet (15 mg total) by mouth at bedtime. For depression and sleep Patient taking differently: Take 15 mg by mouth at bedtime as needed (depression, sleep).  01/19/15   Shuvon B Rankin, NP  oxyCODONE-acetaminophen (PERCOCET/ROXICET) 5-325 MG tablet Take 1-2 tablets by mouth every 6 (six) hours as needed for severe pain. 12/18/15   Montine Circle, PA-C  predniSONE (STERAPRED UNI-PAK 21 TAB)  10 MG (21) TBPK tablet 6 tablets on day 1, 5 tablets on day 2, 4 tablets on day 3, etc... 11/04/15   Mortimer Fries, PA-C  Pseudoephedrine-Ibuprofen (IBUPROFEN AND PSE COLD & SINUS) 30-200 MG TABS Take 1 tablet by mouth daily as needed (cough).    Historical Provider, MD  traZODone (DESYREL) 300 MG tablet Take 1 tablet (300 mg total) by mouth at bedtime as needed for sleep. 01/19/15   Nicholaus Bloom, MD   BP 155/85 mmHg  Pulse 89  Temp(Src) 98 F (36.7 C) (Oral)  Resp 18  Ht 6\' 1"  (1.854 m)  Wt 311 lb 9 oz (141.324 kg)  BMI 41.11 kg/m2  SpO2 99% Physical Exam   Constitutional: He is oriented to person, place, and time. He appears well-developed and well-nourished. No distress.  HENT:  Head: Normocephalic and atraumatic.  Right Ear: Tympanic membrane normal.  Left Ear: Tympanic membrane and ear canal normal. There is tenderness.  Right ear: Normal TM.  Left ear: Normal TM. No signs of erythema or perforation. Ear canal normal, but tender noted to earlobe on manupulation. No dental pain. No evidence of mastoiditis.   Eyes: Conjunctivae and EOM are normal.  Neck: Neck supple. No tracheal deviation present.  Cardiovascular: Normal rate.   Pulmonary/Chest: Effort normal. No respiratory distress.  Musculoskeletal: Normal range of motion.  Neurological: He is alert and oriented to person, place, and time.  Skin: Skin is warm and dry.  Psychiatric: He has a normal mood and affect. His behavior is normal.  Nursing note and vitals reviewed.   ED Course  Procedures (including critical care time)  DIAGNOSTIC STUDIES: Oxygen Saturation is 99% on RA, normal by my interpretation.    COORDINATION OF CARE: 2:49 PM - no obvious signs of infection on exam.  No signs of mastoiditis or deep space infection.  Doubt TMJ.  Pt has drug seeking behaviors.  Discussed treatment plan with pt at bedside which includes Rx antibiotics and referral to dentist/ENT specialist for f/u. Patient declines ibuprofen/Tylenol for pain. Pt verbalized understanding and agreed to plan.    MDM   Final diagnoses:  Left ear pain   BP 155/85 mmHg  Pulse 89  Temp(Src) 98 F (36.7 C) (Oral)  Resp 18  Ht 6\' 1"  (1.854 m)  Wt 141.324 kg  BMI 41.11 kg/m2  SpO2 99%   I personally performed the services described in this documentation, which was scribed in my presence. The recorded information has been reviewed and is accurate.       Domenic Moras, PA-C 01/19/16 [place], MD 01/20/16 [phone]

## 2016-01-19 NOTE — Discharge Instructions (Signed)
We are unable to determine the cause of your left ear and jaw pain.  Take antibiotic and ibuprofen and follow up with a dentist or ENT specialist for further care.    Earache An earache, also called otalgia, can be caused by many things. Pain from an earache can be sharp, dull, or burning. The pain may be temporary or constant. Earaches can be caused by problems with the ear, such as infection in either the middle ear or the ear canal, injury, impacted ear wax, middle ear pressure, or a foreign body in the ear. Ear pain can also result from problems in other areas. This is called referred pain. For example, pain can come from a sore throat, a tooth infection, or problems with the jaw or the joint between the jaw and the skull (temporomandibular joint, or TMJ). The cause of an earache is not always easy to identify. Watchful waiting may be appropriate for some earaches until a clear cause of the pain can be found. HOME CARE INSTRUCTIONS Watch your condition for any changes. The following actions may help to lessen any discomfort that you are feeling:  Take medicines only as directed by your health care provider. This includes ear drops.  Apply ice to your outer ear to help reduce pain.  Put ice in a plastic bag.  Place a towel between your skin and the bag.  Leave the ice on for 20 minutes, 2-3 times per day.  Do not put anything in your ear other than medicine that is prescribed by your health care provider.  Try resting in an upright position instead of lying down. This may help to reduce pressure in the middle ear and relieve pain.  Chew gum if it helps to relieve your ear pain.  Control any allergies that you have.  Keep all follow-up visits as directed by your health care provider. This is important. SEEK MEDICAL CARE IF:  Your pain does not improve within 2 days.  You have a fever.  You have new or worsening symptoms. SEEK IMMEDIATE MEDICAL CARE IF:  You have a severe  headache.  You have a stiff neck.  You have difficulty swallowing.  You have redness or swelling behind your ear.  You have drainage from your ear.  You have hearing loss.  You feel dizzy.   This information is not intended to replace advice given to you by your health care provider. Make sure you discuss any questions you have with your health care provider.   Document Released: 05/18/2004 Document Revised: 10/22/2014 Document Reviewed: 05/02/2014 Elsevier Interactive Patient Education [employer].

## 2016-01-19 NOTE — ED Notes (Signed)
Pt c/o L ear pain onset x 2-3 days, with pain radiating into L face, pt reports hx of ruptured L ear drum, pt reports similar feeling, pt denies injury to the area, pt A&O x4

## 2016-01-21 ENCOUNTER — Emergency Department
Admission: EM | Admit: 2016-01-21 | Discharge: 2016-01-21 | Disposition: A | Discharge status: Home / Self Care | Payer: Self-pay | Attending: Emergency Medicine | Admitting: Emergency Medicine

## 2016-01-21 DIAGNOSIS — F17210 Nicotine dependence, cigarettes, uncomplicated: Secondary | ICD-10-CM | POA: Insufficient documentation

## 2016-01-21 DIAGNOSIS — M779 Enthesopathy, unspecified: Secondary | ICD-10-CM | POA: Insufficient documentation

## 2016-01-21 DIAGNOSIS — M26622 Arthralgia of left temporomandibular joint: Secondary | ICD-10-CM | POA: Insufficient documentation

## 2016-01-21 DIAGNOSIS — Z792 Long term (current) use of antibiotics: Secondary | ICD-10-CM | POA: Insufficient documentation

## 2016-01-21 DIAGNOSIS — Z79899 Other long term (current) drug therapy: Secondary | ICD-10-CM | POA: Insufficient documentation

## 2016-01-21 DIAGNOSIS — M7581 Other shoulder lesions, right shoulder: Secondary | ICD-10-CM

## 2016-01-21 DIAGNOSIS — J45909 Unspecified asthma, uncomplicated: Secondary | ICD-10-CM | POA: Insufficient documentation

## 2016-01-21 DIAGNOSIS — M26609 Unspecified temporomandibular joint disorder, unspecified side: Secondary | ICD-10-CM

## 2016-01-21 DIAGNOSIS — J449 Chronic obstructive pulmonary disease, unspecified: Secondary | ICD-10-CM | POA: Insufficient documentation

## 2016-01-21 MED ORDER — PREDNISONE 10 MG PO TABS: ORAL_TABLET | ORAL | Status: DC

## 2016-01-21 MED ORDER — PREDNISONE 20 MG PO TABS
60.0000 mg | ORAL_TABLET | Freq: Once | ORAL | Status: AC
  Administered 2016-01-21: 60 mg via ORAL
  Filled 2016-01-21: qty 3

## 2016-01-21 MED ORDER — KETOROLAC TROMETHAMINE 30 MG/ML IJ SOLN
30.0000 mg | Freq: Once | INTRAMUSCULAR | Status: AC
  Administered 2016-01-21: 30 mg via INTRAMUSCULAR
  Filled 2016-01-21: qty 1

## 2016-01-21 MED ORDER — TRAMADOL HCL 50 MG PO TABS: 50.0000 mg | ORAL_TABLET | Freq: Four times a day (QID) | ORAL | Status: DC | PRN

## 2016-01-21 MED ORDER — TRAMADOL HCL 50 MG PO TABS
100.0000 mg | ORAL_TABLET | Freq: Once | ORAL | Status: AC
  Administered 2016-01-21: 100 mg via ORAL
  Filled 2016-01-21: qty 2

## 2016-01-21 NOTE — Discharge Instructions (Signed)
Temporomandibular Joint Syndrome  Temporomandibular joint (TMJ) syndrome is a condition that affects the joints between your jaw and your skull. The TMJs are located near your ears and allow your jaw to open and close. These joints and the nearby muscles are involved in all movements of the jaw. People with TMJ syndrome have pain in the area of these joints and muscles. Chewing, biting, or other movements of the jaw can be difficult or painful.  TMJ syndrome can be caused by various things. In many cases, the condition is mild and goes away within a few weeks. For some people, the condition can become a long-term problem.  CAUSES  Possible causes of TMJ syndrome include:  · Grinding your teeth or clenching your jaw. Some people do this when they are under stress.  · Arthritis.  · Injury to the jaw.  · Head or neck injury.  · Teeth or dentures that are not aligned well.  In some cases, the cause of TMJ syndrome may not be known.  SIGNS AND SYMPTOMS  The most common symptom is an aching pain on the side of the head in the area of the TMJ. Other symptoms may include:  · Pain when moving your jaw, such as when chewing or biting.  · Being unable to open your jaw all the way.  · Making a clicking sound when you open your mouth.  · Headache.  · Earache.  · Neck or shoulder pain.  DIAGNOSIS  Diagnosis can usually be made based on your symptoms, your medical history, and a physical exam. Your health care provider may check the range of motion of your jaw. Imaging tests, such as X-rays or an MRI, are sometimes done. You may need to see your dentist to determine if your teeth and jaw are lined up correctly.  TREATMENT  TMJ syndrome often goes away on its own. If treatment is needed, the options may include:  · Eating soft foods and applying ice or heat.  · Medicines to relieve pain or inflammation.  · Medicines to relax the muscles.  · A splint, bite plate, or mouthpiece to prevent teeth grinding or jaw  clenching.  · Relaxation techniques or counseling to help reduce stress.  · Transcutaneous electrical nerve stimulation (TENS). This helps to relieve pain by applying an electrical current through the skin.  · Acupuncture. This is sometimes helpful to relieve pain.  · Jaw surgery. This is rarely needed.  HOME CARE INSTRUCTIONS  · Take medicines only as directed by your health care provider.  · Eat a soft diet if you are having trouble chewing.  · Apply ice to the painful area.    Put ice in a plastic bag.    Place a towel between your skin and the bag.    Leave the ice on for 20 minutes, 2-3 times a day.  · Apply a warm compress to the painful area as directed.  · Massage your jaw area and perform any jaw stretching exercises as recommended by your health care provider.  · If you were given a mouthpiece or bite plate, wear it as directed.  · Avoid foods that require a lot of chewing. Do not chew gum.  · Keep all follow-up visits as directed by your health care provider. This is important.  SEEK MEDICAL CARE IF:  · You are having trouble eating.  · You have new or worsening symptoms.  SEEK IMMEDIATE MEDICAL CARE IF:  · Your jaw locks open or   Interactive Patient Education 2016 [place] Cuff Tendinitis    Rotator cuff tendinitis is inflammation of the tough, cord-like bands that connect muscle to bone (tendons) in your rotator cuff. Your rotator cuff is the collection of all the muscles and tendons that connect your arm to your shoulder. Your rotator cuff holds the head of your upper arm bone (humerus) in the cup (fossa) of your shoulder blade (scapula).  CAUSES  Rotator cuff tendinitis is usually caused by overusing the joint involved.  SIGNS AND SYMPTOMS    Deep ache in the shoulder also felt on the outside upper arm over the shoulder muscle.  Point tenderness over the area that is injured.  Pain comes on gradually and becomes worse with lifting the arm to the side (abduction) or turning it inward (internal rotation).  May lead to a chronic tear: When a rotator cuff tendon becomes inflamed, it runs the risk of losing its blood supply, causing some tendon fibers to die. This increases the risk that the tendon can fray and partially or completely tear. DIAGNOSIS  Rotator cuff tendinitis is diagnosed by taking a medical history, performing a physical exam, and reviewing results of imaging exams. The medical history is useful to help determine the type of rotator cuff injury. The physical exam will include looking at the injured shoulder, feeling the injured area, and watching you do range-of-motion exercises. X-ray exams are typically done to rule out other causes of shoulder pain, such as fractures. MRI is the imaging exam usually used for significant shoulder injuries. Sometimes a dye study called CT arthrogram is done, but it is not as widely used as MRI. In some institutions, special ultrasound tests may also be used to aid in the diagnosis.  TREATMENT  Less Severe Cases  Use of a sling to rest the shoulder for a short period of time. Prolonged use of the sling can cause stiffness, weakness, and loss of motion of the shoulder joint.  Anti-inflammatory medicines, such as ibuprofen or naproxen sodium, may be prescribed. More Severe Cases  Physical therapy.  Use of steroid injections into the shoulder joint.  Surgery. HOME CARE INSTRUCTIONS  Use a sling or splint until the pain decreases. Prolonged use of the sling can cause stiffness, weakness, and loss of motion of the shoulder joint.  Apply ice to the injured area:  Put ice in a plastic bag.  Place a towel between your skin and the bag.  Leave the ice on for 20 minutes, 2-3 times a day. Try to  avoid use other than gentle range of motion while your shoulder is painful. Use the shoulder and exercise only as directed by your health care provider. Stop exercises or range of motion if pain or discomfort increases, unless directed otherwise by your health care provider.  Only take over-the-counter or prescription medicines for pain, discomfort, or fever as directed by your health care provider.  If you were given a shoulder sling and straps (immobilizer), do not remove it except as directed, or until you see a health care provider for a follow-up exam. If you need to remove it, move your arm as little as possible or as directed.  You may want to sleep on several pillows at night to lessen swelling and pain. SEEK IMMEDIATE MEDICAL CARE IF:  Your shoulder pain increases or new pain develops in your arm, hand, or fingers and is not relieved with medicines.  You have new, unexplained symptoms, especially increased numbness  in the hands or loss of strength.  You develop any worsening of the problems that brought you in for care.  Your arm, hand, or fingers are numb or tingling.  Your arm, hand, or fingers are swollen, painful, or turn white or blue. MAKE SURE YOU:  Understand these instructions.  Will watch your condition.  Will get help right away if you are not doing well or get worse. This information is not intended to replace advice given to you by your health care provider. Make sure you discuss any questions you have with your health care provider.  Document Released: 12/22/2003 Document Revised: 10/22/2014 Document Reviewed: 05/13/2013  Elsevier Interactive Patient Education [employer].

## 2016-01-21 NOTE — ED Provider Notes (Signed)
CSN: [id number]     Arrival date & time 01/21/16  1226 History   First MD Initiated Contact with Patient 01/21/16 1341     Chief Complaint  Patient presents with  . Otalgia  . Shoulder Pain     (Consider location/radiation/quality/duration/timing/severity/associated sxs/prior Treatment) HPI  40 year old male presents of her department for evaluation of left ear pain, points to the left TMJ has been present for 7 days. No trauma injury cough congestion or fevers. No drainage. Patient has pain with chewing, wife states he grinds his teeth at nighttime. Patient also complains of 6-7 months of right shoulder pain along the subacromial bursa. Patient has pain with overhead lifting. He has 2 small kids that cause some pain with lifting. 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 He is right-hand dominant. He has had no improvement with ibuprofen. Denies any neck pain numbness or tingling. No trauma or injury.  Past Medical History  Diagnosis Date  . Asthma   . Obesity   . COPD (chronic obstructive pulmonary disease) ([place])   . Emphysema   . Cigarette smoker   . Cancer [facility])    Past Surgical History  Procedure Laterality Date  . Ankle surgery     Family History  Problem Relation Age of Onset  . COPD Neg Hx   . Birth defects Sister    Social History  Substance Use Topics  . Smoking status: Current Every Day Smoker -- 3.00 packs/day    Types: Cigarettes, Cigars  . Smokeless tobacco: Never Used  . Alcohol Use: No     Comment: occ    Review of Systems  Constitutional: Negative.  Negative for fever, chills, activity change and appetite change.  HENT: Negative for congestion, ear pain, mouth sores, rhinorrhea, sinus pressure, sore throat and trouble swallowing.   Eyes: Negative for photophobia, pain and discharge.  Respiratory: Negative for cough, chest tightness and shortness of breath.   Cardiovascular: Negative for chest pain and leg swelling.   Gastrointestinal: Negative for nausea, vomiting, abdominal pain, diarrhea and abdominal distention.  Genitourinary: Negative for dysuria and difficulty urinating.  Musculoskeletal: Positive for arthralgias. Negative for back pain and gait problem.  Skin: Negative for color change and rash.  Neurological: Negative for dizziness and headaches.  Hematological: Negative for adenopathy.  Psychiatric/Behavioral: Negative for behavioral problems and agitation.      Allergies  Hydrocodone  Home Medications   Prior to Admission medications   Medication Sig Start Date End Date Taking? Authorizing Provider  albuterol (PROVENTIL HFA;VENTOLIN HFA) 108 (90 Base) MCG/ACT inhaler Inhale 2 puffs into the lungs every 6 (six) hours as needed for wheezing or shortness of breath. 11/04/15   Mortimer Fries, PA-C  ALPRAZolam Duanne Moron) 0.5 MG tablet Take 0.5 mg by mouth 2 (two) times daily. 08/30/15   Historical Provider, MD  amphetamine-dextroamphetamine (ADDERALL) 30 MG tablet Take 30 mg by mouth 2 (two) times daily. 09/06/15   Historical Provider, MD  azithromycin (ZITHROMAX Z-PAK) 250 MG tablet Take 1 tablet (250 mg total) by mouth daily. 09/10/15   Merrily Pew, MD  guaiFENesin (MUCINEX) 600 MG 12 hr tablet Take 600 mg by mouth 2 (two) times daily as needed for cough.    Historical Provider, MD  HYDROcodone-homatropine (HYCODAN) 5-1.5 MG/5ML syrup Take 5 mLs by mouth every 6 (six) hours as needed for cough. 11/04/15   Mortimer Fries, PA-C  ibuprofen (ADVIL,MOTRIN) 800 MG tablet Take 1 tablet (800 mg total) by mouth 3 (three) times daily. 01/19/16  Domenic Moras, PA-C  levofloxacin (LEVAQUIN) 500 MG tablet Take 1 tablet (500 mg total) by mouth daily. 11/04/15   Mortimer Fries, PA-C  mirtazapine (REMERON) 15 MG tablet Take 1 tablet (15 mg total) by mouth at bedtime. For depression and sleep Patient taking differently: Take 15 mg by mouth at bedtime as needed (depression, sleep).  01/19/15   Shuvon B Rankin, NP   oxyCODONE-acetaminophen (PERCOCET/ROXICET) 5-325 MG tablet Take 1-2 tablets by mouth every 6 (six) hours as needed for severe pain. 12/18/15   Montine Circle, PA-C  penicillin v potassium (VEETID) 500 MG tablet Take 1 tablet (500 mg total) by mouth 3 (three) times daily. 01/19/16   Domenic Moras, PA-C  predniSONE (DELTASONE) 10 MG tablet 10 day taper. 5,5,4,4,3,3,2,2,1,1 01/21/16   Duanne Guess, PA-C  Pseudoephedrine-Ibuprofen (IBUPROFEN AND PSE COLD & SINUS) 30-200 MG TABS Take 1 tablet by mouth daily as needed (cough).    Historical Provider, MD  traMADol (ULTRAM) 50 MG tablet Take 1 tablet (50 mg total) by mouth every 6 (six) hours as needed. 01/21/16   Duanne Guess, PA-C  traZODone (DESYREL) 300 MG tablet Take 1 tablet (300 mg total) by mouth at bedtime as needed for sleep. 01/19/15   Nicholaus Bloom, MD   BP 154/81 mmHg  Pulse 87  Temp(Src) 98 F (36.7 C) (Oral)  Resp 20  Ht 6\' 1"  (1.854 m)  Wt 136.079 kg  BMI 39.59 kg/m2  SpO2 97% Physical Exam  Constitutional: He is oriented to person, place, and time. He appears well-developed and well-nourished.  HENT:  Head: Normocephalic and atraumatic.  Right Ear: Hearing, tympanic membrane, external ear and ear canal normal.  Left Ear: Hearing, tympanic membrane, external ear and ear canal normal.  Nose: Nose normal.  Mouth/Throat: Oropharynx is clear and moist. No oropharyngeal exudate.  Patient moderate point tender over the left TMJ. He has pain with opening and closing the jaw. No swelling.  Eyes: Conjunctivae and EOM are normal. Pupils are equal, round, and reactive to light.  Neck: Normal range of motion. Neck supple.  Cardiovascular: Normal rate, regular rhythm, normal heart sounds and intact distal pulses.   Pulmonary/Chest: Effort normal and breath sounds normal. No respiratory distress. He has no wheezes. He has no rales. He exhibits no tenderness.  Abdominal: Soft. Bowel sounds are normal. He exhibits no distension. There is no  tenderness.  Musculoskeletal: Normal range of motion. He exhibits no edema or tenderness.  Examination of the right shoulder shows patient has full range of motion with discomfort with abduction and flexion greater than 90. He has full composite fist with grip strength 5 out of 5. He has 55 strength with biceps and triceps strength. He is tender subacromial space, no before meals joint tenderness. No deformity noted.  Lymphadenopathy:    He has no cervical adenopathy.  Neurological: He is alert and oriented to person, place, and time.  Skin: Skin is warm and dry.  Psychiatric: He has a normal mood and affect. His behavior is normal. Judgment and thought content normal.    ED Course  Procedures (including critical care time) Labs Review Labs Reviewed - No data to display  Imaging Review No results found. I have personally reviewed and evaluated these images and lab results as part of my medical decision-making.   EKG Interpretation None      MDM   Final diagnoses:  Rotator cuff tendinitis, right  TMJ (temporomandibular joint syndrome)   40 year old male with seven-day history of  left TMJ pain. He will follow-up with ENT if no improvement with steroid taper. Patient also with chronic right shoulder rotator cuff tendinitis. Hopefully will see improvement with steroid taper. He is given tramadol for additional pain relief, 50 mg 1 tab by mouth every 6 hours as needed for pain quantity #15. Follow-up with orthopedics if no improvement right shoulder pain. Return to the ER for any worsening symptoms urgent changes in health.    Duanne Guess, PA-C 01/21/16 [place], MD 01/21/16 [phone]
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
## 2016-01-21 NOTE — ED Notes (Signed)
NAD noted at time of D/C. Pt denies questions or concerns. Pt ambulatory to the lobby at this time.  Pt refused wheelchair to the lobby at this time.  

## 2016-01-21 NOTE — ED Notes (Signed)
Patient states that he has been having left ear pain for the past week, pain radiates into his jaw.   Patient is also c/o right shoulder pain with some limited movement. Pain is worse with movement. Patient denies numbness and tingling in his hands.

## 2016-03-25 IMAGING — CR DG FOOT COMPLETE 3+V*L*
3 series · 3 of 3 positions shown · non-contrast
Comparison: None.

CLINICAL DATA: Left heel pain.  No known injury.

EXAM:
LEFT FOOT - COMPLETE 3+ VIEW

[foot ap]
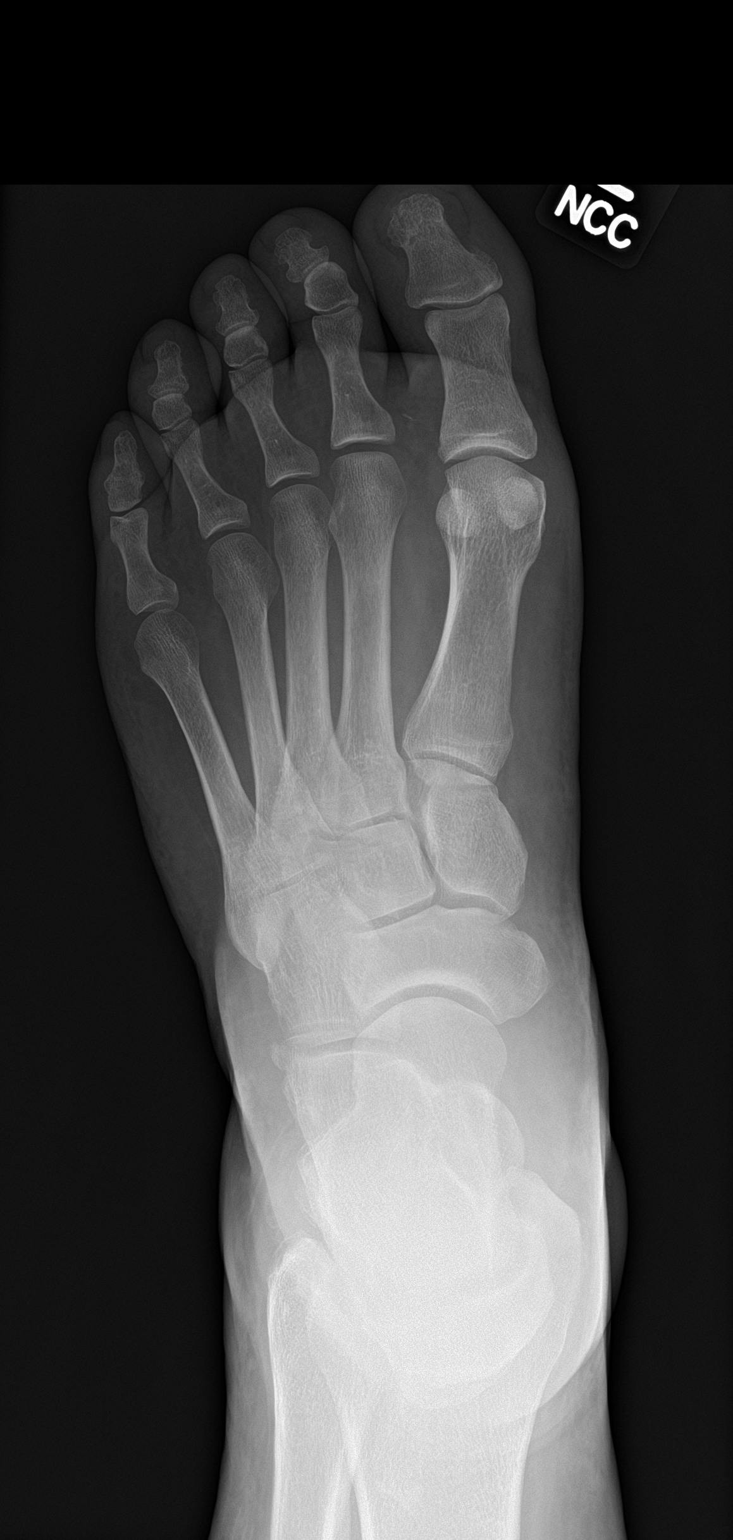

[foot obl]
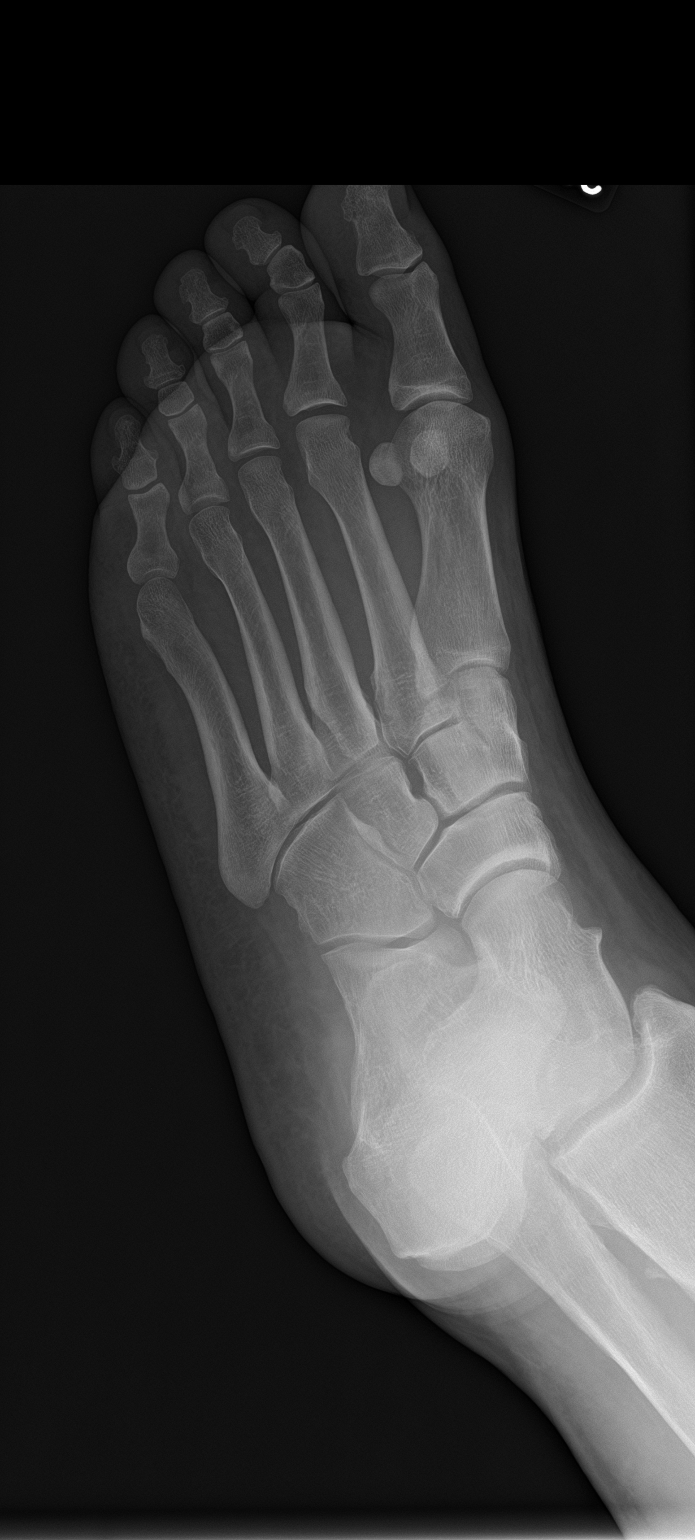

[foot lat]
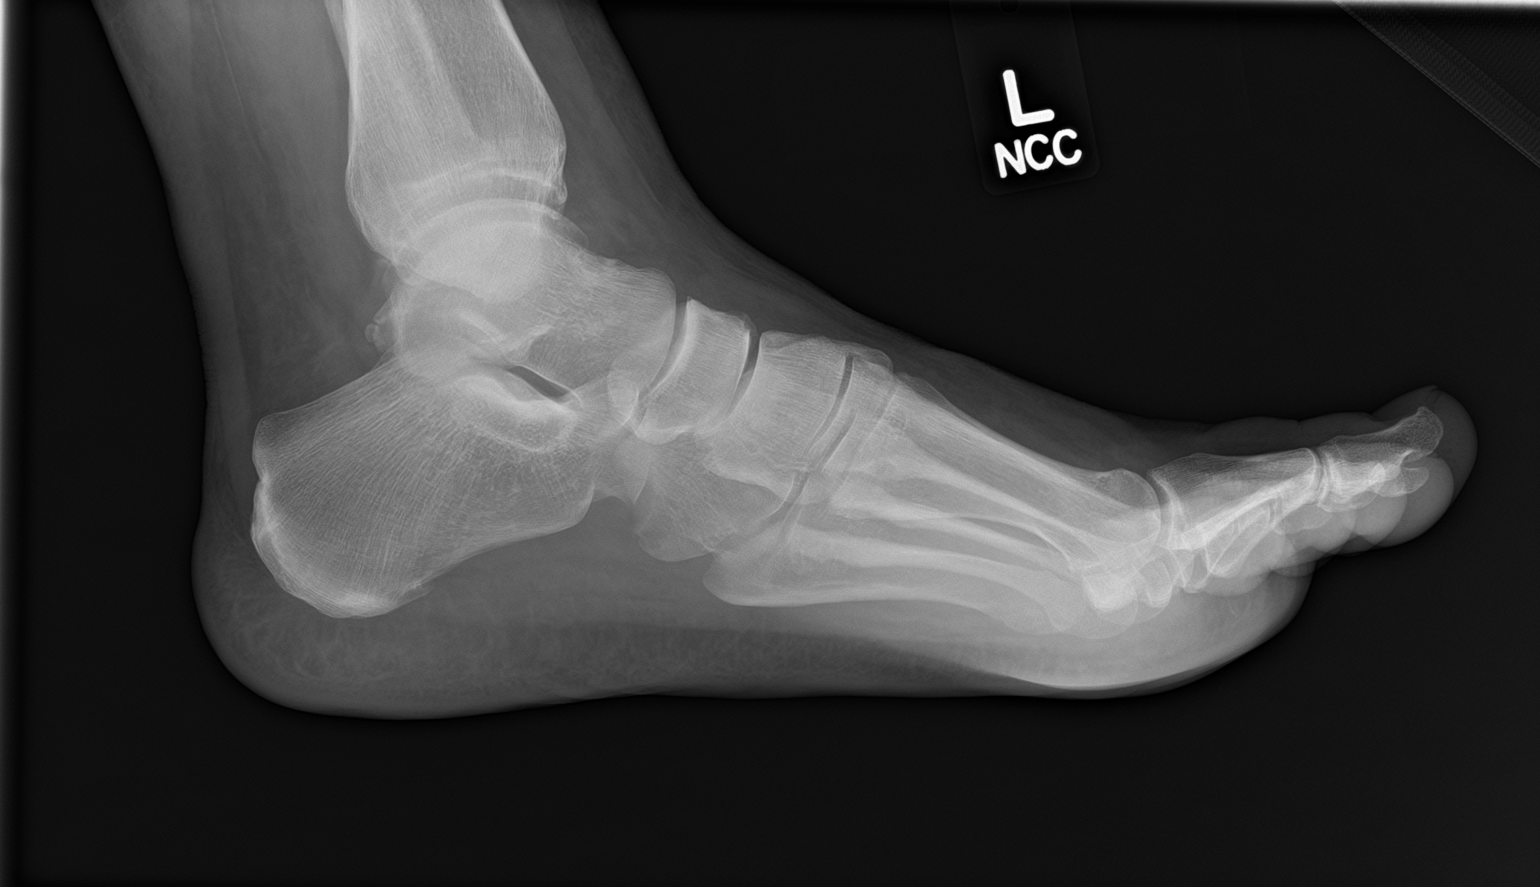

[3 of 3 positions shown; findings below may reference images not displayed]

FINDINGS: There is no evidence of fracture or dislocation. There is no
evidence of arthropathy or other focal bone abnormality. No evidence
of calcaneal bone spur. Soft tissues are unremarkable.
IMPRESSION: Negative.

## 2016-06-04 ENCOUNTER — Emergency Department (HOSPITAL_COMMUNITY): Payer: Self-pay

## 2016-06-04 ENCOUNTER — Encounter (HOSPITAL_COMMUNITY): Payer: Self-pay | Admitting: Emergency Medicine

## 2016-06-04 ENCOUNTER — Inpatient Hospital Stay (HOSPITAL_COMMUNITY)
Admission: EM | Admit: 2016-06-04 | Discharge: 2016-06-06 | DRG: 194 | Disposition: A | Discharge status: Home / Self Care | Payer: Self-pay | Attending: Internal Medicine | Admitting: Internal Medicine

## 2016-06-04 DIAGNOSIS — C009 Malignant neoplasm of lip, unspecified: Secondary | ICD-10-CM | POA: Diagnosis present

## 2016-06-04 DIAGNOSIS — F329 Major depressive disorder, single episode, unspecified: Secondary | ICD-10-CM | POA: Diagnosis present

## 2016-06-04 DIAGNOSIS — J181 Lobar pneumonia, unspecified organism: Secondary | ICD-10-CM

## 2016-06-04 DIAGNOSIS — C4402 Squamous cell carcinoma of skin of lip: Secondary | ICD-10-CM

## 2016-06-04 DIAGNOSIS — M549 Dorsalgia, unspecified: Secondary | ICD-10-CM | POA: Diagnosis present

## 2016-06-04 DIAGNOSIS — Z79899 Other long term (current) drug therapy: Secondary | ICD-10-CM

## 2016-06-04 DIAGNOSIS — E669 Obesity, unspecified: Secondary | ICD-10-CM | POA: Diagnosis present

## 2016-06-04 DIAGNOSIS — Z8249 Family history of ischemic heart disease and other diseases of the circulatory system: Secondary | ICD-10-CM

## 2016-06-04 DIAGNOSIS — J45909 Unspecified asthma, uncomplicated: Secondary | ICD-10-CM | POA: Diagnosis present

## 2016-06-04 DIAGNOSIS — G8929 Other chronic pain: Secondary | ICD-10-CM | POA: Diagnosis present

## 2016-06-04 DIAGNOSIS — R042 Hemoptysis: Secondary | ICD-10-CM | POA: Diagnosis present

## 2016-06-04 DIAGNOSIS — J189 Pneumonia, unspecified organism: Principal | ICD-10-CM

## 2016-06-04 DIAGNOSIS — C801 Malignant (primary) neoplasm, unspecified: Secondary | ICD-10-CM

## 2016-06-04 DIAGNOSIS — F17210 Nicotine dependence, cigarettes, uncomplicated: Secondary | ICD-10-CM | POA: Diagnosis present

## 2016-06-04 DIAGNOSIS — Z6836 Body mass index (BMI) 36.0-36.9, adult: Secondary | ICD-10-CM

## 2016-06-04 DIAGNOSIS — F17290 Nicotine dependence, other tobacco product, uncomplicated: Secondary | ICD-10-CM | POA: Diagnosis present

## 2016-06-04 DIAGNOSIS — J45901 Unspecified asthma with (acute) exacerbation: Secondary | ICD-10-CM | POA: Diagnosis present

## 2016-06-04 LAB — URINALYSIS, ROUTINE W REFLEX MICROSCOPIC
Glucose, UA: NEGATIVE mg/dL
Hgb urine dipstick: NEGATIVE
Ketones, ur: NEGATIVE mg/dL
Leukocytes, UA: NEGATIVE
Nitrite: NEGATIVE
Protein, ur: 30 mg/dL — AB
Specific Gravity, Urine: 1.038 — ABNORMAL HIGH (ref 1.005–1.030)
pH: 6 (ref 5.0–8.0)

## 2016-06-04 LAB — CBC WITH DIFFERENTIAL/PLATELET
Basophils Absolute: 0 10*3/uL (ref 0.0–0.1)
Basophils Relative: 0 %
Eosinophils Absolute: 0.1 10*3/uL (ref 0.0–0.7)
Eosinophils Relative: 1 %
HCT: 35.2 % — ABNORMAL LOW (ref 39.0–52.0)
Hemoglobin: 11.5 g/dL — ABNORMAL LOW (ref 13.0–17.0)
Lymphocytes Relative: 19 %
Lymphs Abs: 1.8 10*3/uL (ref 0.7–4.0)
MCH: 27.8 pg (ref 26.0–34.0)
MCHC: 32.7 g/dL (ref 30.0–36.0)
MCV: 85.2 fL (ref 78.0–100.0)
Monocytes Absolute: 0.9 10*3/uL (ref 0.1–1.0)
Monocytes Relative: 9 %
Neutro Abs: 6.5 10*3/uL (ref 1.7–7.7)
Neutrophils Relative %: 71 %
Platelets: 225 10*3/uL (ref 150–400)
RBC: 4.13 MIL/uL — ABNORMAL LOW (ref 4.22–5.81)
RDW: 13.4 % (ref 11.5–15.5)
WBC: 9.2 10*3/uL (ref 4.0–10.5)

## 2016-06-04 LAB — COMPREHENSIVE METABOLIC PANEL
ALT: 20 U/L (ref 17–63)
AST: 16 U/L (ref 15–41)
Albumin: 3.8 g/dL (ref 3.5–5.0)
Alkaline Phosphatase: 83 U/L (ref 38–126)
Anion gap: 8 (ref 5–15)
BUN: 10 mg/dL (ref 6–20)
CO2: 30 mmol/L (ref 22–32)
Calcium: 8.8 mg/dL — ABNORMAL LOW (ref 8.9–10.3)
Chloride: 101 mmol/L (ref 101–111)
Creatinine, Ser: 0.71 mg/dL (ref 0.61–1.24)
GFR calc Af Amer: >60 mL/min (ref >60)
GFR calc non Af Amer: >60 mL/min (ref >60)
Glucose, Bld: 103 mg/dL — ABNORMAL HIGH (ref 65–99)
Potassium: 3.3 mmol/L — ABNORMAL LOW (ref 3.5–5.1)
Sodium: 139 mmol/L (ref 135–145)
Total Bilirubin: 0.5 mg/dL (ref 0.3–1.2)
Total Protein: 8 g/dL (ref 6.5–8.1)

## 2016-06-04 LAB — URINE MICROSCOPIC-ADD ON
Bacteria, UA: NONE SEEN
Squamous Epithelial / LPF: NONE SEEN

## 2016-06-04 MED ORDER — AZITHROMYCIN 250 MG PO TABS
500.0000 mg | ORAL_TABLET | ORAL | Status: DC
  Administered 2016-06-05: 500 mg via ORAL
  Filled 2016-06-04: qty 2

## 2016-06-04 MED ORDER — ALBUTEROL SULFATE (2.5 MG/3ML) 0.083% IN NEBU
5.0000 mg | INHALATION_SOLUTION | Freq: Once | RESPIRATORY_TRACT | Status: AC
  Administered 2016-06-04: 5 mg via RESPIRATORY_TRACT
  Filled 2016-06-04: qty 6

## 2016-06-04 MED ORDER — IOPAMIDOL (ISOVUE-370) INJECTION 76%
100.0000 mL | Freq: Once | INTRAVENOUS | Status: AC | PRN
  Administered 2016-06-04: 100 mL via INTRAVENOUS

## 2016-06-04 MED ORDER — ALPRAZOLAM 0.5 MG PO TABS
0.5000 mg | ORAL_TABLET | Freq: Two times a day (BID) | ORAL | Status: DC
  Administered 2016-06-04 – 2016-06-06 (×4): 0.5 mg via ORAL
  Filled 2016-06-04 (×4): qty 1

## 2016-06-04 MED ORDER — ALBUTEROL (5 MG/ML) CONTINUOUS INHALATION SOLN
10.0000 mg/h | INHALATION_SOLUTION | RESPIRATORY_TRACT | Status: DC
  Filled 2016-06-04: qty 20

## 2016-06-04 MED ORDER — MIRTAZAPINE 30 MG PO TABS
15.0000 mg | ORAL_TABLET | Freq: Every evening | ORAL | Status: DC | PRN
Start: 2016-06-04 — End: 2016-06-06
  Administered 2016-06-04 – 2016-06-05 (×2): 15 mg via ORAL
  Filled 2016-06-04 (×2): qty 1

## 2016-06-04 MED ORDER — SODIUM CHLORIDE 0.9 % IV BOLUS (SEPSIS)
1000.0000 mL | Freq: Once | INTRAVENOUS | Status: AC
  Administered 2016-06-04: 1000 mL via INTRAVENOUS

## 2016-06-04 MED ORDER — DEXTROSE 5 % IV SOLN
500.0000 mg | Freq: Once | INTRAVENOUS | Status: AC
  Administered 2016-06-04: 500 mg via INTRAVENOUS
  Filled 2016-06-04: qty 500

## 2016-06-04 MED ORDER — BENZONATATE 100 MG PO CAPS: 200.0000 mg | ORAL_CAPSULE | Freq: Three times a day (TID) | ORAL | Status: DC | PRN

## 2016-06-04 MED ORDER — OXYCODONE-ACETAMINOPHEN 5-325 MG PO TABS: 1.0000 | ORAL_TABLET | Freq: Four times a day (QID) | ORAL | Status: DC | PRN

## 2016-06-04 MED ORDER — DEXTROSE 5 % IV SOLN
1.0000 g | Freq: Once | INTRAVENOUS | Status: AC
  Administered 2016-06-04: 1 g via INTRAVENOUS
  Filled 2016-06-04: qty 10

## 2016-06-04 MED ORDER — IPRATROPIUM BROMIDE 0.02 % IN SOLN
0.5000 mg | Freq: Once | RESPIRATORY_TRACT | Status: DC
  Filled 2016-06-04: qty 2.5

## 2016-06-04 MED ORDER — ALBUTEROL SULFATE (2.5 MG/3ML) 0.083% IN NEBU: 2.5000 mg | INHALATION_SOLUTION | Freq: Four times a day (QID) | RESPIRATORY_TRACT | Status: DC | PRN

## 2016-06-04 MED ORDER — DEXTROSE 5 % IV SOLN
1.0000 g | INTRAVENOUS | Status: DC
  Administered 2016-06-05: 1 g via INTRAVENOUS
  Filled 2016-06-04: qty 10

## 2016-06-04 MED ORDER — METHYLPREDNISOLONE SODIUM SUCC 125 MG IJ SOLR
125.0000 mg | Freq: Once | INTRAMUSCULAR | Status: AC
  Administered 2016-06-04: 125 mg via INTRAVENOUS
  Filled 2016-06-04: qty 2

## 2016-06-04 MED ORDER — TRAZODONE HCL 100 MG PO TABS: 300.0000 mg | ORAL_TABLET | Freq: Every evening | ORAL | Status: DC | PRN

## 2016-06-04 NOTE — ED Notes (Signed)
Went in to collect labs and patient was upset and stated he did not need any labs he was just here because of his asthma.  I explain to him why we do labs and he stated "that he has been here all fucking day"  so I left the room.  Nurse is going to collect the labs when she starts the IV

## 2016-06-04 NOTE — ED Notes (Signed)
Pt given dinner tray and awaiting further disposition.

## 2016-06-04 NOTE — ED Notes (Signed)
PA at bedside to discuss plan of care for pt.

## 2016-06-04 NOTE — Progress Notes (Signed)
PHARMACY NOTE   Pharmacy has been assisting with dosing of Ceftriaxone and Azithromycin for CAP. Dosage remains stable at Ceftriaxone 1g IV q24h and Azithromycin 500mg  IV q24h and need for further dosage adjustment appears unlikely at present.    Will sign off at this time.  Please reconsult if a change in clinical status warrants re-evaluation.   Lindell Spar, PharmD, BCPS Pager: [phone] 06/04/2016 10:11 PM

## 2016-06-04 NOTE — H&P (Signed)
History and Physical    Ehaan Stagnaro [id number] DOB: December 04, 1975 DOA: 06/04/2016   PCP: Delia Chimes, NP Chief Complaint:  Chief Complaint  Patient presents with  . Shortness of Breath    HPI: Amogh Beauman is a 40 y.o. male with medical history significant of Asthma, prior heavy smoking but quit other than cigars.  Patient presents to ED with c/o cough, SOB, hemoptysis, fever, chills.  Symptoms onset on Friday, have been persistent since onset, sputum became bloody today and he presents to ED.  Home inhalors provide minimal relief.  No h/o blood clots, does have h/o "skin cancer on lower lip".  ED Course: CT scan chest performed, no PE but has LLL PNA with surrounding alveolar hemorrhage.  Review of Systems: As per HPI otherwise 10 point review of systems negative.    Past Medical History:  Diagnosis Date  . Asthma   . Cancer ([place])   . Cigarette smoker   . COPD (chronic obstructive pulmonary disease) ([place])   . Emphysema   . Obesity     Past Surgical History:  Procedure Laterality Date  . ANKLE SURGERY       reports that he has been smoking Cigarettes and Cigars.  He has been smoking about 3.00 packs per day. He has never used smokeless tobacco. He reports that he does not drink alcohol or use drugs.  No Active Allergies  Family History  Problem Relation Age of Onset  . Birth defects Sister   . COPD Neg Hx      Prior to Admission medications   Medication Sig Start Date End Date Taking? Authorizing Provider  albuterol (PROVENTIL) (2.5 MG/3ML) 0.083% nebulizer solution Take 2.5 mg by nebulization every 6 (six) hours as needed for wheezing or shortness of breath.   Yes Historical Provider, MD  ALPRAZolam Duanne Moron) 0.5 MG tablet Take 0.5 mg by mouth 2 (two) times daily. 08/30/15  Yes Historical Provider, MD  amphetamine-dextroamphetamine (ADDERALL) 30 MG tablet Take 30 mg by mouth 2 (two) times daily. 09/06/15  Yes Historical Provider, MD  ibuprofen  (ADVIL,MOTRIN) 200 MG tablet Take 600-1,000 mg by mouth every 6 (six) hours as needed for moderate pain.   Yes Historical Provider, MD  mirtazapine (REMERON) 15 MG tablet Take 1 tablet (15 mg total) by mouth at bedtime. For depression and sleep Patient taking differently: Take 15 mg by mouth at bedtime as needed (depression, sleep).  01/19/15   Shuvon B Rankin, NP  oxyCODONE-acetaminophen (PERCOCET/ROXICET) 5-325 MG tablet Take 1-2 tablets by mouth every 6 (six) hours as needed for severe pain. 12/18/15   Montine Circle, PA-C  traZODone (DESYREL) 300 MG tablet Take 1 tablet (300 mg total) by mouth at bedtime as needed for sleep. Patient not taking: Reported on 06/04/2016 01/19/15   Nicholaus Bloom, MD    Physical Exam: Vitals:   06/04/16 1700 06/04/16 1718 06/04/16 1942 06/04/16 2103  BP: 133/71  137/88 136/70  Pulse: 83  98 76  Resp: 16  18 18   Temp:    98.6 F (37 C)  TempSrc:    Oral  SpO2: 97% 95% 99% 98%  Weight:    124.7 kg (275 lb)  Height:    6\' 1"  (1.854 m)      Constitutional: NAD, calm, comfortable Eyes: PERRL, lids and conjunctivae normal ENMT: Mucous membranes are moist. Posterior pharynx clear of any exudate or lesions.Normal dentition.  Neck: normal, supple, no masses, no thyromegaly Respiratory: clear to auscultation bilaterally, no wheezing,  no crackles. Normal respiratory effort. No accessory muscle use.  Cardiovascular: Regular rate and rhythm, no murmurs / rubs / gallops. No extremity edema. 2+ pedal pulses. No carotid bruits.  Abdomen: no tenderness, no masses palpated. No hepatosplenomegaly. Bowel sounds positive.  Musculoskeletal: no clubbing / cyanosis. No joint deformity upper and lower extremities. Good ROM, no contractures. Normal muscle tone.  Skin: no rashes, lesions, ulcers. No induration Neurologic: CN 2-12 grossly intact. Sensation intact, DTR normal. Strength 5/5 in all 4.  Psychiatric: Normal judgment and insight. Alert and oriented x 3. Normal mood.     Labs on Admission: I have personally reviewed following labs and imaging studies  CBC:  Recent Labs Lab 06/04/16 1732  WBC 9.2  NEUTROABS 6.5  HGB 11.5*  HCT 35.2*  MCV 85.2  PLT 123456   Basic Metabolic Panel:  Recent Labs Lab 06/04/16 1732  NA 139  K 3.3*  CL 101  CO2 30  GLUCOSE 103*  BUN 10  CREATININE 0.71  CALCIUM 8.8*   GFR: Estimated Creatinine Clearance: 173.2 mL/min (by C-G formula based on SCr of 0.8 mg/dL). Liver Function Tests:  Recent Labs Lab 06/04/16 1732  AST 16  ALT 20  ALKPHOS 83  BILITOT 0.5  PROT 8.0  ALBUMIN 3.8   No results for input(s): LIPASE, AMYLASE in the last 168 hours. No results for input(s): AMMONIA in the last 168 hours. Coagulation Profile: No results for input(s): INR, PROTIME in the last 168 hours. Cardiac Enzymes: No results for input(s): CKTOTAL, CKMB, CKMBINDEX, TROPONINI in the last 168 hours. BNP (last 3 results) No results for input(s): PROBNP in the last 8760 hours. HbA1C: No results for input(s): HGBA1C in the last 72 hours. CBG: No results for input(s): GLUCAP in the last 168 hours. Lipid Profile: No results for input(s): CHOL, HDL, LDLCALC, TRIG, CHOLHDL, LDLDIRECT in the last 72 hours. Thyroid Function Tests: No results for input(s): TSH, T4TOTAL, FREET4, T3FREE, THYROIDAB in the last 72 hours. Anemia Panel: No results for input(s): VITAMINB12, FOLATE, FERRITIN, TIBC, IRON, RETICCTPCT in the last 72 hours. Urine analysis:    Component Value Date/Time   COLORURINE AMBER (A) 06/04/2016 1915   APPEARANCEUR CLEAR 06/04/2016 1915   LABSPEC 1.038 (H) 06/04/2016 1915   PHURINE 6.0 06/04/2016 1915   GLUCOSEU NEGATIVE 06/04/2016 [place] NEGATIVE 06/04/2016 1915   BILIRUBINUR SMALL (A) 06/04/2016 1915   KETONESUR NEGATIVE 06/04/2016 1915   PROTEINUR 30 (A) 06/04/2016 1915   UROBILINOGEN 1.0 01/04/2013 2120   NITRITE NEGATIVE 06/04/2016 1915   LEUKOCYTESUR NEGATIVE 06/04/2016 1915   Sepsis  Labs: @LABRCNTIP (procalcitonin:4,lacticidven:4) )No results found for this or any previous visit (from the past 240 hour(s)).   Radiological Exams on Admission: Dg Chest 2 View  Result Date: 06/04/2016 CLINICAL DATA:  Shortness of breath and hemoptysis. EXAM: CHEST  2 VIEW COMPARISON:  None 22,017 FINDINGS: Cardiomediastinal silhouette is normal. Mediastinal contours appear intact. There is no evidence of pleural effusion or pneumothorax. Subtle opacity is seen overlying the left lung base on the frontal view, likely located in the left lower lobe. Osseous structures are without acute abnormality. Soft tissues are grossly normal. IMPRESSION: Subtle opacity within the left lung base. This may represent focal airspace consolidation, round atelectasis or potentially pulmonary mass. CT of the chest may be considered if found clinically necessary. Electronically Signed   By: Fidela Salisbury M.D.   On: 06/04/2016 13:21   Ct Angio Chest Pe W/cm &/or Wo Cm  Result Date: 06/04/2016 CLINICAL  DATA:  Shortness of breath and hemoptysis.  Chills. EXAM: CT ANGIOGRAPHY CHEST WITH CONTRAST TECHNIQUE: Multidetector CT imaging of the chest was performed using the standard protocol during bolus administration of intravenous contrast. Multiplanar CT image reconstructions and MIPs were obtained to evaluate the vascular anatomy. CONTRAST:  100 cc Isovue 370 intravenous COMPARISON:  03/26/2012 FINDINGS: Cardiovascular: Study optimized for the pulmonary arteries. No identified pulmonary embolism. Diminished blood flow to the left lower lobe, likely shunting in the setting of extensive airspace disease. If the extensive airspace disease was related to an embolism with infarct, would expect clot at the level of the visualized vessels. Negative aorta. Normal heart size. No pericardial effusion. Mediastinum: Mild prominence of left hilar lymph nodes considered reactive in this setting. Lungs/Pleura: Ground-glass and consolidative  opacities in the left lower lobe with volume loss. Patchy ground-glass opacity in the lingula. Trace left pleural effusion. Appearance suggests pneumonia. Alveolar hemorrhage may be superimposed given the history. No airway lesion or cavitary nodule is seen. Airways are diffusely thickened which may be related to patient's smoking status. Upper abdomen: No acute findings. Musculoskeletal: No acute or aggressive finding. Review of the MIP images confirms the above findings. IMPRESSION: 1. Left basilar airspace favoring pneumonia. Some of the opacity is alveolar hemorrhage based on the history. Followup PA and lateral chest X-ray is recommended in 3-4 weeks following trial of antibiotic therapy to ensure resolution. 2. No visualized pulmonary embolism. Electronically Signed   By: Monte Fantasia M.D.   On: 06/04/2016 18:54    EKG: Independently reviewed.  Assessment/Plan Principal Problem:   CAP (community acquired pneumonia) Active Problems:   Hemoptysis   Left lower lobe pneumonia    1. LLL CAP with hemoptysis - 1. PNA pathway 2. Rocephin and azithromycin 3. Suppress cough with tessalon 4. Repeat labs in AM 2. Hemoptysis - 1. Needs follow up imaging in 3-4 weeks   DVT prophylaxis: SCDs Code Status: Full Family Communication: Family at bedside Consults called: None Admission status: Admit to obs   Tonisha Silvey, [facility] Pager [phone] from 7PM-7AM  If 7AM-7PM, please contact the day physician for the patient [email or web address] Password [email or web address]  06/04/2016, 9:15 PM

## 2016-06-04 NOTE — ED Notes (Signed)
Dr.Gardner at bedside to assess pt.

## 2016-06-04 NOTE — ED Notes (Signed)
Report attempted to be called and was advised nurse was not available.

## 2016-06-04 NOTE — ED Notes (Signed)
Patient transported to CT 

## 2016-06-04 NOTE — ED Notes (Signed)
Pt reporting that he wants to leave at this time. Pt reports improvement in breathing but wants to go home.

## 2016-06-04 NOTE — ED Triage Notes (Addendum)
Patient presents with multiple complaints including SOB, coughing up blood, HA, chills, right ear pain x2-3 days.

## 2016-06-04 NOTE — ED Notes (Signed)
Report attempted was told nurse would call back.  

## 2016-06-04 NOTE — ED Notes (Signed)
Pt tolerated ambulation well. o2 stat maintained at 99

## 2016-06-04 NOTE — ED Provider Notes (Signed)
[facility] DEPT Provider Note   CSN: [id number] Arrival date & time: 06/04/16  1144     History   Chief Complaint Chief Complaint  Patient presents with  . Shortness of Breath    HPI Danny Hale is a 40 y.o. male.  HPI Patient presents to the emergency department with cough, wheezing, body aches and fever.  The patient states that the symptoms started this past Friday.  He states that nothing seems to make the condition better or worse.  The patient states that he was trying uses at home nebulizer without significant relief of his symptoms.  He should states that he also has coughed up some blood over the last few days.  Patient states this has happened in the past.  The patient states that he did not take any other medications prior to arrival. The patient denies chest pain,  headache,blurred vision, neck pain, fever, cough, weakness, numbness, dizziness, anorexia, edema, abdominal pain, nausea, vomiting, diarrhea, rash, back pain, dysuria, hematemesis, bloody stool, near syncope, or syncope. Past Medical History:  Diagnosis Date  . Asthma   . Cancer ([place])   . Cigarette smoker   . COPD (chronic obstructive pulmonary disease) ([place])   . Emphysema   . Obesity     Patient Active Problem List   Diagnosis Date Noted  . GAD (generalized anxiety disorder) 01/18/2015  . Polysubstance dependence including opioid type drug, continuous use ([place]) 01/13/2015  . Substance induced mood disorder ([place]) 01/12/2015  . Chronic pain of right ankle 05/20/2014  . Adjustment disorder with anxious mood 05/18/2014  . Asthma with acute exacerbation 05/18/2014  . Pain in joint, lower leg 05/18/2014  . Status asthmaticus with COPD (chronic obstructive pulmonary disease) ([place]) 03/28/2014  . Asthma exacerbation 01/05/2013  . Status asthmaticus 01/05/2013    Past Surgical History:  Procedure Laterality Date  . ANKLE SURGERY         Home Medications    Prior to Admission medications     Medication Sig Start Date End Date Taking? Authorizing Provider  albuterol (PROVENTIL) (2.5 MG/3ML) 0.083% nebulizer solution Take 2.5 mg by nebulization every 6 (six) hours as needed for wheezing or shortness of breath.   Yes Historical Provider, MD  ALPRAZolam Duanne Moron) 0.5 MG tablet Take 0.5 mg by mouth 2 (two) times daily. 08/30/15  Yes Historical Provider, MD  amphetamine-dextroamphetamine (ADDERALL) 30 MG tablet Take 30 mg by mouth 2 (two) times daily. 09/06/15  Yes Historical Provider, MD  ibuprofen (ADVIL,MOTRIN) 200 MG tablet Take 600-1,000 mg by mouth every 6 (six) hours as needed for moderate pain.   Yes Historical Provider, MD  albuterol (PROVENTIL HFA;VENTOLIN HFA) 108 (90 Base) MCG/ACT inhaler Inhale 2 puffs into the lungs every 6 (six) hours as needed for wheezing or shortness of breath. Patient not taking: Reported on 06/04/2016 11/04/15   Mortimer Fries, PA-C  azithromycin (ZITHROMAX Z-PAK) 250 MG tablet Take 1 tablet (250 mg total) by mouth daily. Patient not taking: Reported on 06/04/2016 09/10/15   Merrily Pew, MD  HYDROcodone-homatropine [facility]) 5-1.5 MG/5ML syrup Take 5 mLs by mouth every 6 (six) hours as needed for cough. Patient not taking: Reported on 06/04/2016 11/04/15   Mortimer Fries, PA-C  ibuprofen (ADVIL,MOTRIN) 800 MG tablet Take 1 tablet (800 mg total) by mouth 3 (three) times daily. Patient not taking: Reported on 06/04/2016 01/19/16   Domenic Moras, PA-C  levofloxacin (LEVAQUIN) 500 MG tablet Take 1 tablet (500 mg total) by mouth daily. Patient not taking: Reported on  06/04/2016 11/04/15   Mortimer Fries, PA-C  mirtazapine (REMERON) 15 MG tablet Take 1 tablet (15 mg total) by mouth at bedtime. For depression and sleep Patient taking differently: Take 15 mg by mouth at bedtime as needed (depression, sleep).  01/19/15   Shuvon B Rankin, NP  oxyCODONE-acetaminophen (PERCOCET/ROXICET) 5-325 MG tablet Take 1-2 tablets by mouth every 6 (six) hours as needed for severe pain. 12/18/15    Montine Circle, PA-C  penicillin v potassium (VEETID) 500 MG tablet Take 1 tablet (500 mg total) by mouth 3 (three) times daily. Patient not taking: Reported on 06/04/2016 01/19/16   Domenic Moras, PA-C  predniSONE (DELTASONE) 10 MG tablet 10 day taper. 5,5,4,4,3,3,2,2,1,1 Patient not taking: Reported on 06/04/2016 01/21/16   Duanne Guess, PA-C  traMADol (ULTRAM) 50 MG tablet Take 1 tablet (50 mg total) by mouth every 6 (six) hours as needed. Patient not taking: Reported on 06/04/2016 01/21/16   Duanne Guess, PA-C  traZODone (DESYREL) 300 MG tablet Take 1 tablet (300 mg total) by mouth at bedtime as needed for sleep. Patient not taking: Reported on 06/04/2016 01/19/15   Nicholaus Bloom, MD    Family History Family History  Problem Relation Age of Onset  . Birth defects Sister   . COPD Neg Hx     Social History Social History  Substance Use Topics  . Smoking status: Current Every Day Smoker    Packs/day: 3.00    Types: Cigarettes, Cigars  . Smokeless tobacco: Never Used  . Alcohol use No     Comment: occ     Allergies   Review of patient's allergies indicates no active allergies.   Review of Systems Review of Systems All other systems negative except as documented in the HPI. All pertinent positives and negatives as reviewed in the HPI.  Physical Exam Updated Vital Signs BP 137/88 (BP Location: Left Arm)   Pulse 98   Temp 98.8 F (37.1 C) (Oral)   Resp 18   SpO2 99%   Physical Exam  Constitutional: He is oriented to person, place, and time. He appears well-developed and well-nourished. No distress.  HENT:  Head: Normocephalic and atraumatic.  Mouth/Throat: Oropharynx is clear and moist.  Eyes: Pupils are equal, round, and reactive to light.  Neck: Normal range of motion. Neck supple.  Cardiovascular: Normal rate, regular rhythm and normal heart sounds.  Exam reveals no gallop and no friction rub.   No murmur heard. Pulmonary/Chest: Effort normal. No respiratory distress.  He has wheezes. He exhibits no tenderness.  Abdominal: Soft. Bowel sounds are normal. He exhibits no distension. There is no tenderness.  Neurological: He is alert and oriented to person, place, and time. He exhibits normal muscle tone. Coordination normal.  Skin: Skin is warm and dry. No rash noted. No erythema.  Psychiatric: He has a normal mood and affect. His behavior is normal.  Nursing note and vitals reviewed.    ED Treatments / Results  Labs (all labs ordered are listed, but only abnormal results are displayed) Labs Reviewed  COMPREHENSIVE METABOLIC PANEL - Abnormal; Notable for the following:       Result Value   Potassium 3.3 (*)    Glucose, Bld 103 (*)    Calcium 8.8 (*)    All other components within normal limits  CBC WITH DIFFERENTIAL/PLATELET - Abnormal; Notable for the following:    RBC 4.13 (*)    Hemoglobin 11.5 (*)    HCT 35.2 (*)    All other  components within normal limits  URINALYSIS, ROUTINE W REFLEX MICROSCOPIC (NOT AT [facility])    EKG  EKG Interpretation  Date/Time:  Monday June 04 2016 12:24:48 EDT Ventricular Rate:  88 PR Interval:    QRS Duration: 91 QT Interval:  351 QTC Calculation: 425 R Axis:   66 Text Interpretation:  Sinus rhythm since previous tracing, tachycardia has resolved otherwise similar to previous EKGs Confirmed by LITTLE MD, RACHEL [phone]) on 06/04/2016 6:58:52 PM       Radiology Dg Chest 2 View  Result Date: 06/04/2016 CLINICAL DATA:  Shortness of breath and hemoptysis. EXAM: CHEST  2 VIEW COMPARISON:  None 22,017 FINDINGS: Cardiomediastinal silhouette is normal. Mediastinal contours appear intact. There is no evidence of pleural effusion or pneumothorax. Subtle opacity is seen overlying the left lung base on the frontal view, likely located in the left lower lobe. Osseous structures are without acute abnormality. Soft tissues are grossly normal. IMPRESSION: Subtle opacity within the left lung base. This may represent focal  airspace consolidation, round atelectasis or potentially pulmonary mass. CT of the chest may be considered if found clinically necessary. Electronically Signed   By: Fidela Salisbury M.D.   On: 06/04/2016 13:21   Ct Angio Chest Pe W/cm &/or Wo Cm  Result Date: 06/04/2016 CLINICAL DATA:  Shortness of breath and hemoptysis.  Chills. EXAM: CT ANGIOGRAPHY CHEST WITH CONTRAST TECHNIQUE: Multidetector CT imaging of the chest was performed using the standard protocol during bolus administration of intravenous contrast. Multiplanar CT image reconstructions and MIPs were obtained to evaluate the vascular anatomy. CONTRAST:  100 cc Isovue 370 intravenous COMPARISON:  03/26/2012 FINDINGS: Cardiovascular: Study optimized for the pulmonary arteries. No identified pulmonary embolism. Diminished blood flow to the left lower lobe, likely shunting in the setting of extensive airspace disease. If the extensive airspace disease was related to an embolism with infarct, would expect clot at the level of the visualized vessels. Negative aorta. Normal heart size. No pericardial effusion. Mediastinum: Mild prominence of left hilar lymph nodes considered reactive in this setting. Lungs/Pleura: Ground-glass and consolidative opacities in the left lower lobe with volume loss. Patchy ground-glass opacity in the lingula. Trace left pleural effusion. Appearance suggests pneumonia. Alveolar hemorrhage may be superimposed given the history. No airway lesion or cavitary nodule is seen. Airways are diffusely thickened which may be related to patient's smoking status. Upper abdomen: No acute findings. Musculoskeletal: No acute or aggressive finding. Review of the MIP images confirms the above findings. IMPRESSION: 1. Left basilar airspace favoring pneumonia. Some of the opacity is alveolar hemorrhage based on the history. Followup PA and lateral chest X-ray is recommended in 3-4 weeks following trial of antibiotic therapy to ensure resolution.  2. No visualized pulmonary embolism. Electronically Signed   By: Monte Fantasia M.D.   On: 06/04/2016 18:54    Procedures Procedures (including critical care time)  Medications Ordered in ED Medications  ipratropium (ATROVENT) nebulizer solution 0.5 mg (0.5 mg Nebulization Not Given 06/04/16 1637)  albuterol (PROVENTIL) (2.5 MG/3ML) 0.083% nebulizer solution 5 mg (5 mg Nebulization Given 06/04/16 1228)  sodium chloride 0.9 % bolus 1,000 mL (1,000 mLs Intravenous New Bag/Given 06/04/16 1737)  methylPREDNISolone sodium succinate (SOLU-MEDROL) 125 mg/2 mL injection 125 mg (125 mg Intravenous Given 06/04/16 1739)  albuterol (PROVENTIL) (2.5 MG/3ML) 0.083% nebulizer solution 5 mg (5 mg Nebulization Given 06/04/16 1718)  iopamidol (ISOVUE-370) 76 % injection 100 mL (100 mLs Intravenous Contrast Given 06/04/16 1821)     Initial Impression / Assessment and Plan /  ED Course  I have reviewed the triage vital signs and the nursing notes.  Pertinent labs & imaging results that were available during my care of the patient were reviewed by me and considered in my medical decision making (see chart for details).  Clinical Course    Patient be admitted to the hospital due to this.  Alveolar hemorrhage along with the pneumonia.  Patient is advised plan and all questions were answered.  I spoke with the [facility] hospitalist, who will come and evaluate the patient  Final Clinical Impressions(s) / ED Diagnoses   Final diagnoses:  None    New Prescriptions New Prescriptions   No medications on file     Dalia Heading, PA-C 06/06/16 [place], MD 06/09/16 [phone]

## 2016-06-05 DIAGNOSIS — G8929 Other chronic pain: Secondary | ICD-10-CM

## 2016-06-05 DIAGNOSIS — M549 Dorsalgia, unspecified: Secondary | ICD-10-CM

## 2016-06-05 LAB — BASIC METABOLIC PANEL
ANION GAP: 5 (ref 5–15)
BUN: 14 mg/dL (ref 6–20)
CO2: 28 mmol/L (ref 22–32)
Calcium: 9.1 mg/dL (ref 8.9–10.3)
Chloride: 106 mmol/L (ref 101–111)
Creatinine, Ser: 0.74 mg/dL (ref 0.61–1.24)
GFR calc Af Amer: >60 mL/min (ref >60)
GLUCOSE: 167 mg/dL — AB (ref 65–99)
POTASSIUM: 3.6 mmol/L (ref 3.5–5.1)
Sodium: 139 mmol/L (ref 135–145)

## 2016-06-05 LAB — STREP PNEUMONIAE URINARY ANTIGEN: Strep Pneumo Urinary Antigen: NEGATIVE

## 2016-06-05 LAB — HIV ANTIBODY (ROUTINE TESTING W REFLEX): HIV SCREEN 4TH GENERATION: NONREACTIVE

## 2016-06-05 LAB — CBC
HEMATOCRIT: 36.7 % — AB (ref 39.0–52.0)
Hemoglobin: 12.1 g/dL — ABNORMAL LOW (ref 13.0–17.0)
MCH: 27.9 pg (ref 26.0–34.0)
MCHC: 33 g/dL (ref 30.0–36.0)
MCV: 84.8 fL (ref 78.0–100.0)
PLATELETS: 266 10*3/uL (ref 150–400)
RBC: 4.33 MIL/uL (ref 4.22–5.81)
RDW: 13.4 % (ref 11.5–15.5)
WBC: 6.5 10*3/uL (ref 4.0–10.5)

## 2016-06-05 LAB — EXPECTORATED SPUTUM ASSESSMENT W REFEX TO RESP CULTURE

## 2016-06-05 LAB — EXPECTORATED SPUTUM ASSESSMENT W GRAM STAIN, RFLX TO RESP C

## 2016-06-05 MED ORDER — IPRATROPIUM-ALBUTEROL 0.5-2.5 (3) MG/3ML IN SOLN
3.0000 mL | Freq: Four times a day (QID) | RESPIRATORY_TRACT | Status: DC
  Administered 2016-06-05 – 2016-06-06 (×3): 3 mL via RESPIRATORY_TRACT
  Filled 2016-06-05 (×4): qty 3

## 2016-06-05 MED ORDER — IPRATROPIUM-ALBUTEROL 0.5-2.5 (3) MG/3ML IN SOLN: 3.0000 mL | RESPIRATORY_TRACT | Status: DC | PRN

## 2016-06-05 MED ORDER — ALBUTEROL SULFATE (2.5 MG/3ML) 0.083% IN NEBU: 2.5000 mg | INHALATION_SOLUTION | RESPIRATORY_TRACT | Status: DC

## 2016-06-05 MED ORDER — GUAIFENESIN-DM 100-10 MG/5ML PO SYRP
5.0000 mL | ORAL_SOLUTION | ORAL | Status: DC | PRN
  Administered 2016-06-06: 5 mL via ORAL
  Filled 2016-06-05: qty 10

## 2016-06-05 MED ORDER — ALBUTEROL SULFATE (2.5 MG/3ML) 0.083% IN NEBU: 2.5000 mg | INHALATION_SOLUTION | RESPIRATORY_TRACT | Status: DC | PRN

## 2016-06-05 MED ORDER — METHYLPREDNISOLONE SODIUM SUCC 40 MG IJ SOLR
40.0000 mg | Freq: Two times a day (BID) | INTRAMUSCULAR | Status: DC
  Administered 2016-06-05 – 2016-06-06 (×3): 40 mg via INTRAVENOUS
  Filled 2016-06-05 (×3): qty 1

## 2016-06-05 MED ORDER — IBUPROFEN 200 MG PO TABS
400.0000 mg | ORAL_TABLET | Freq: Three times a day (TID) | ORAL | Status: DC | PRN
Start: 2016-06-05 — End: 2016-06-06
  Administered 2016-06-05: 400 mg via ORAL
  Filled 2016-06-05: qty 2

## 2016-06-05 NOTE — Progress Notes (Signed)
PROGRESS NOTE    Danny Hale  [id number] DOB: 28-Jul-1976 DOA: 06/04/2016 PCP: Delia Chimes, NP     Brief Narrative: Left lower lobe pneumonia complicated with asthma exacerbation and hemoptysis. 40 yo male with tobacco abuse, presented with dyspnea and hemoptysis. Persistent hemoptysis and wheezing. Plan to continue observe response to antibiotic therapy.    Assessment & Plan:   Principal Problem:   CAP (community acquired pneumonia) Active Problems:   Hemoptysis   Left lower lobe pneumonia   1. Community acquired pneumonia. Will continue antibiotic therapy with ceftriaxone and azithromycin. Continue oxymetry monitor and supplemental 02 per [place] to target 02 sat above 92%. Cough control with guiafenesin DM. ' 2. Asthma exacerbation. Exacerbation triggered by pneumonia, will continue supplemental 02 per [place], will continue bronchodilator therapy with duonebs. Will add systemic steroids with methylprednisolone IV.  3.  Hemoptysis. Suspected to be due to pneumonia, will continue supportive care, follow cell counts, avoid anticoagulants. Will request to collect bloody sputum in a cup to quantify amount of blood.    4. Chronic back pain. Will use ibuprofen for now, will avoid narcotics.  5. Depression. Will continue mirtazapine.    DVT prophylaxis:  SCD, out of bed as tolerated  Code Status:  full Family Communication: no family at the bedside Disposition Plan: home  Consultants:     Procedures:   Antimicrobials: Azithromycin #1 Ceftriaxone  #1  Subjective: Patient with persistent hemoptysis, moderate in intensity, no improving or worsening factors, associated with wheezing, no clots. No nausea or vomiting. At home uses nebulizer treatments for asthma. Continue to smoke.   Objective: Vitals:   06/04/16 1942 06/04/16 2103 06/04/16 2204 06/05/16 0545  BP: 137/88 136/70 124/73 120/82  Pulse: 98 76 85 79  Resp: 18 18 18 18   Temp:  98.6 F (37 C) 99.2 F (37.3 C)  98.6 F (37 C)  TempSrc:  Oral Oral Oral  SpO2: 99% 98% 97% 97%  Weight:  124.7 kg (275 lb)    Height:  6\' 1"  (1.854 m)      Intake/Output Summary (Last 24 hours) at 06/05/16 1200 Last data filed at 06/05/16 0300  Gross per 24 hour  Intake              290 ml  Output                0 ml  Net              290 ml   Filed Weights   06/04/16 2103  Weight: 124.7 kg (275 lb)    Examination:  General exam: Not in pain or dyspnea E ENT: no conjunctival pallor or icterus Respiratory system: . Respiratory effort normal. Positive expiratory wheezing bilaterally, diffuse, with no rhonchi. Positive rales at the left base.  Cardiovascular system: S1 & S2 heard, RRR. No JVD, murmurs, rubs, gallops or clicks. No pedal edema. Gastrointestinal system: Abdomen is nondistended, soft and nontender. No organomegaly or masses felt. Normal bowel sounds heard. Central nervous system: Alert and oriented. No focal neurological deficits. Extremities: Symmetric 5 x 5 power. Skin: No rashes, lesions or ulcers Psychiatry: Judgement and insight appear normal. Mood & affect appropriate.     Data Reviewed: I have personally reviewed following labs and imaging studies  CBC:  Recent Labs Lab 06/04/16 1732 06/05/16 0340  WBC 9.2 6.5  NEUTROABS 6.5  --   HGB 11.5* 12.1*  HCT 35.2* 36.7*  MCV 85.2 84.8  PLT 225 266   Basic  Metabolic Panel:  Recent Labs Lab 06/04/16 1732 06/05/16 0340  NA 139 139  K 3.3* 3.6  CL 101 106  CO2 30 28  GLUCOSE 103* 167*  BUN 10 14  CREATININE 0.71 0.74  CALCIUM 8.8* 9.1   GFR: Estimated Creatinine Clearance: 173.2 mL/min (by C-G formula based on SCr of 0.8 mg/dL). Liver Function Tests:  Recent Labs Lab 06/04/16 1732  AST 16  ALT 20  ALKPHOS 83  BILITOT 0.5  PROT 8.0  ALBUMIN 3.8   No results for input(s): LIPASE, AMYLASE in the last 168 hours. No results for input(s): AMMONIA in the last 168 hours. Coagulation Profile: No results for input(s):  INR, PROTIME in the last 168 hours. Cardiac Enzymes: No results for input(s): CKTOTAL, CKMB, CKMBINDEX, TROPONINI in the last 168 hours. BNP (last 3 results) No results for input(s): PROBNP in the last 8760 hours. HbA1C: No results for input(s): HGBA1C in the last 72 hours. CBG: No results for input(s): GLUCAP in the last 168 hours. Lipid Profile: No results for input(s): CHOL, HDL, LDLCALC, TRIG, CHOLHDL, LDLDIRECT in the last 72 hours. Thyroid Function Tests: No results for input(s): TSH, T4TOTAL, FREET4, T3FREE, THYROIDAB in the last 72 hours. Anemia Panel: No results for input(s): VITAMINB12, FOLATE, FERRITIN, TIBC, IRON, RETICCTPCT in the last 72 hours. Sepsis Labs: No results for input(s): PROCALCITON, LATICACIDVEN in the last 168 hours.  Recent Results (from the past 240 hour(s))  Culture, sputum-assessment     Status: None   Collection Time: 06/04/16  9:01 PM  Result Value Ref Range Status   Specimen Description SPUTUM  Final   Special Requests NONE  Final   Sputum evaluation   Final    THIS SPECIMEN IS ACCEPTABLE. RESPIRATORY CULTURE REPORT TO FOLLOW.   Report Status 06/05/2016 FINAL  Final         Radiology Studies: Dg Chest 2 View  Result Date: 06/04/2016 CLINICAL DATA:  Shortness of breath and hemoptysis. EXAM: CHEST  2 VIEW COMPARISON:  None 22,017 FINDINGS: Cardiomediastinal silhouette is normal. Mediastinal contours appear intact. There is no evidence of pleural effusion or pneumothorax. Subtle opacity is seen overlying the left lung base on the frontal view, likely located in the left lower lobe. Osseous structures are without acute abnormality. Soft tissues are grossly normal. IMPRESSION: Subtle opacity within the left lung base. This may represent focal airspace consolidation, round atelectasis or potentially pulmonary mass. CT of the chest may be considered if found clinically necessary. Electronically Signed   By: Fidela Salisbury M.D.   On: 06/04/2016 13:21    Ct Angio Chest Pe W/cm &/or Wo Cm  Result Date: 06/04/2016 CLINICAL DATA:  Shortness of breath and hemoptysis.  Chills. EXAM: CT ANGIOGRAPHY CHEST WITH CONTRAST TECHNIQUE: Multidetector CT imaging of the chest was performed using the standard protocol during bolus administration of intravenous contrast. Multiplanar CT image reconstructions and MIPs were obtained to evaluate the vascular anatomy. CONTRAST:  100 cc Isovue 370 intravenous COMPARISON:  03/26/2012 FINDINGS: Cardiovascular: Study optimized for the pulmonary arteries. No identified pulmonary embolism. Diminished blood flow to the left lower lobe, likely shunting in the setting of extensive airspace disease. If the extensive airspace disease was related to an embolism with infarct, would expect clot at the level of the visualized vessels. Negative aorta. Normal heart size. No pericardial effusion. Mediastinum: Mild prominence of left hilar lymph nodes considered reactive in this setting. Lungs/Pleura: Ground-glass and consolidative opacities in the left lower lobe with volume loss. Patchy ground-glass opacity  in the lingula. Trace left pleural effusion. Appearance suggests pneumonia. Alveolar hemorrhage may be superimposed given the history. No airway lesion or cavitary nodule is seen. Airways are diffusely thickened which may be related to patient's smoking status. Upper abdomen: No acute findings. Musculoskeletal: No acute or aggressive finding. Review of the MIP images confirms the above findings. IMPRESSION: 1. Left basilar airspace favoring pneumonia. Some of the opacity is alveolar hemorrhage based on the history. Followup PA and lateral chest X-ray is recommended in 3-4 weeks following trial of antibiotic therapy to ensure resolution. 2. No visualized pulmonary embolism. Electronically Signed   By: Monte Fantasia M.D.   On: 06/04/2016 18:54        Scheduled Meds: . ALPRAZolam  0.5 mg Oral BID  . azithromycin  500 mg Oral Q24H  .  cefTRIAXone (ROCEPHIN)  IV  1 g Intravenous Q24H  . ipratropium  0.5 mg Nebulization Once   Continuous Infusions:    LOS: 0 days        Mauricio Gerome Apley, MD [facility] Pager [phone]  If 7PM-7AM, please contact night-coverage [email or web address] Password [email or web address] 06/05/2016, 12:00 PM

## 2016-06-05 NOTE — Progress Notes (Signed)
Nutrition Brief Note  Patient identified on the Malnutrition Screening Tool (MST) Report  Per MST screen, "lost weight because patient wanted to lose wt, 100lb/76yr". Pt with no unintentional weight loss.   Wt Readings from Last 15 Encounters:  06/04/16 275 lb (124.7 kg)  01/21/16 300 lb (136.1 kg)  01/19/16 (!) 311 lb 9 oz (141.3 kg)  11/04/15 (!) 310 lb (140.6 kg)  09/09/15 (!) 320 lb (145.2 kg)  01/12/15 300 lb (136.1 kg)  08/09/14 300 lb (136.1 kg)  05/18/14 (!) 338 lb 8 oz (153.5 kg)  05/18/14 (!) 330 lb (149.7 kg)  04/11/14 (!) 352 lb (159.7 kg)  03/29/14 (!) 356 lb 14.4 oz (161.9 kg)  09/02/13 (!) 338 lb 11.2 oz (153.6 kg)  01/08/13 (!) 324 lb (147 kg)    Body mass index is 36.28 kg/m. Patient meets criteria for obesity based on current BMI.   Current diet order is regular. Labs and medications reviewed.   No nutrition interventions warranted at this time. If nutrition issues arise, please consult RD.   Danny Bibles, MS, RD, LDN Pager: [phone] After Hours Pager: [phone]

## 2016-06-06 DIAGNOSIS — C4402 Squamous cell carcinoma of skin of lip: Secondary | ICD-10-CM

## 2016-06-06 DIAGNOSIS — J189 Pneumonia, unspecified organism: Principal | ICD-10-CM

## 2016-06-06 DIAGNOSIS — J45901 Unspecified asthma with (acute) exacerbation: Secondary | ICD-10-CM

## 2016-06-06 DIAGNOSIS — R042 Hemoptysis: Secondary | ICD-10-CM

## 2016-06-06 LAB — CBC WITH DIFFERENTIAL/PLATELET
Basophils Absolute: 0 10*3/uL (ref 0.0–0.1)
Basophils Relative: 0 %
Eosinophils Absolute: 0 10*3/uL (ref 0.0–0.7)
Eosinophils Relative: 0 %
HEMATOCRIT: 34.6 % — AB (ref 39.0–52.0)
HEMOGLOBIN: 11.3 g/dL — AB (ref 13.0–17.0)
LYMPHS ABS: 1.2 10*3/uL (ref 0.7–4.0)
LYMPHS PCT: 10 %
MCH: 27.3 pg (ref 26.0–34.0)
MCHC: 32.7 g/dL (ref 30.0–36.0)
MCV: 83.6 fL (ref 78.0–100.0)
MONO ABS: 0.5 10*3/uL (ref 0.1–1.0)
MONOS PCT: 4 %
NEUTROS ABS: 10.3 10*3/uL — AB (ref 1.7–7.7)
NEUTROS PCT: 86 %
Platelets: 281 10*3/uL (ref 150–400)
RBC: 4.14 MIL/uL — ABNORMAL LOW (ref 4.22–5.81)
RDW: 13.4 % (ref 11.5–15.5)
WBC: 12 10*3/uL — ABNORMAL HIGH (ref 4.0–10.5)

## 2016-06-06 LAB — BASIC METABOLIC PANEL
Anion gap: 7 (ref 5–15)
BUN: 17 mg/dL (ref 6–20)
CALCIUM: 9 mg/dL (ref 8.9–10.3)
CHLORIDE: 106 mmol/L (ref 101–111)
CO2: 26 mmol/L (ref 22–32)
CREATININE: 0.74 mg/dL (ref 0.61–1.24)
GFR calc non Af Amer: >60 mL/min (ref >60)
GLUCOSE: 164 mg/dL — AB (ref 65–99)
Potassium: 4.1 mmol/L (ref 3.5–5.1)
Sodium: 139 mmol/L (ref 135–145)

## 2016-06-06 MED ORDER — PREDNISONE 10 MG PO TABS: ORAL_TABLET | ORAL | 0 refills | Status: DC

## 2016-06-06 MED ORDER — AZITHROMYCIN 250 MG PO TABS: 250.0000 mg | ORAL_TABLET | Freq: Every day | ORAL | 0 refills | Status: AC

## 2016-06-06 MED ORDER — CEFPODOXIME PROXETIL 200 MG PO TABS: 200.0000 mg | ORAL_TABLET | Freq: Two times a day (BID) | ORAL | 0 refills | Status: DC

## 2016-06-06 MED ORDER — ALBUTEROL SULFATE (2.5 MG/3ML) 0.083% IN NEBU: 2.5000 mg | INHALATION_SOLUTION | RESPIRATORY_TRACT | Status: DC | PRN

## 2016-06-06 MED ORDER — IPRATROPIUM-ALBUTEROL 0.5-2.5 (3) MG/3ML IN SOLN: 3.0000 mL | Freq: Three times a day (TID) | RESPIRATORY_TRACT | Status: DC

## 2016-06-06 MED ORDER — GUAIFENESIN-DM 100-10 MG/5ML PO SYRP: 5.0000 mL | ORAL_SOLUTION | ORAL | 0 refills | Status: DC | PRN

## 2016-06-06 MED ORDER — ALBUTEROL SULFATE HFA 108 (90 BASE) MCG/ACT IN AERS: 2.0000 | INHALATION_SPRAY | Freq: Four times a day (QID) | RESPIRATORY_TRACT | 2 refills | Status: DC | PRN

## 2016-06-06 MED FILL — predniSONE 10 MG TABS: 10 | 6 days supply | Qty: 21 | Fill #0

## 2016-06-06 MED FILL — AZITHROMYCIN 250 MG TABLET: 250 | 5 days supply | Qty: 5 | Fill #0

## 2016-06-06 MED FILL — SM TUSSIN DM SYRUP: 100-10 | 5 days supply | Qty: 118 | Fill #0

## 2016-06-06 MED FILL — !VENTOLIN HFA INHALER: 108 (90 BAS | 20 days supply | Qty: 18 | Fill #0

## 2016-06-06 MED FILL — CEFPODOXIME 200 MG TABLET: 200 | 5 days supply | Qty: 10 | Fill #0

## 2016-06-06 NOTE — Progress Notes (Signed)
Pt is currently active with [facility] as PCP. Follow up appointment made for pt and placed on AVS. Marney Doctor RN,BSN,NCM [phone]

## 2016-06-06 NOTE — Discharge Summary (Signed)
Physician Discharge Summary  Danny Hale [id number] DOB: July 06, 1976 DOA: 06/04/2016  PCP: Delia Chimes, NP- does not have the money to see her regularly- have requested and appt be made for him with [facility]  Admit date: 06/04/2016 Discharge date: 06/06/2016  Admitted From: home Disposition:  home   Recommendations for Outpatient Follow-up:  1. PA and Lateral CXR after treatment for pneumonia is complete  Discharge Condition:  stable   CODE STATUS:  Full code   Diet recommendation:   Heart healthy Consultations:  none    Discharge Diagnoses:  Principal Problem:   CAP (community acquired pneumonia) Active Problems:   Asthma exacerbation   Hemoptysis   Left lower lobe pneumonia   Squamous cell cancer of lip    Subjective: States he feels much better. Cough is improving. Hemoptysis not completely resolved.   Brief Summary: 40 y/o male with h/o asthma, possible COPD (per his history), heavy smoking who has a chronic cough but presented with worsening of cough and hemoptysis. Also complained of fever and chills. Symptoms started a few days prior to admission. Imaging revealed a LLL PNA with surrounding alveolar hemorrhage.   Hospital Course:  Community acquired pneumonia.  Has been receiving ceftriaxone and azithromycin which -  have transitioned Rocephin to Vantin - will aim for a 1 wk course -  Not hypoxic or dyspneic with exertion. Hemoptysis is mild. Sputum still yellow green.  Cough control with guiafenesin DM.  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  - Exacerbation triggered by pneumonia - has been receiving Solumedrol- will transition to Prednisone taper - patient claims that he has been told in the past that he has COPD but has never been assessed with PFTs  Chronic back pain. Will  use ibuprofen for now, will avoid narcotics.  Depression. Will continue mirtazapine.     Discharge Instructions  Discharge Instructions    Diet - low sodium heart healthy    Complete by:  As directed   Increase activity slowly    Complete by:  As directed       Medication List    TAKE these medications   albuterol (2.5 MG/3ML) 0.083% nebulizer solution Commonly known as:  PROVENTIL Take 2.5 mg by nebulization every 6 (six) hours as needed for wheezing or shortness of breath. What changed:  Another medication with the same name was added. Make sure you understand how and when to take each.   albuterol 108 (90 Base) MCG/ACT inhaler Commonly known as:  PROVENTIL HFA;VENTOLIN HFA Inhale 2 puffs into the lungs every 6 (six) hours as needed for wheezing or shortness of breath. What changed:  You were already taking a medication with the same name, and this prescription was added. Make sure you understand how and when to take each.   ALPRAZolam 0.5 MG tablet Commonly known as:  XANAX Take 0.5 mg by mouth 2 (two) times daily.   amphetamine-dextroamphetamine 30 MG tablet Commonly known as:  ADDERALL Take 30 mg by mouth 2 (two) times daily.   azithromycin 250 MG tablet Commonly known as:  ZITHROMAX Take 1 tablet (250 mg total) by mouth daily.   cefpodoxime 200 MG tablet Commonly known as:  VANTIN Take 1 tablet (200 mg total) by mouth 2 (two) times daily.   guaiFENesin-dextromethorphan 100-10 MG/5ML syrup Commonly known as:  ROBITUSSIN DM Take 5 mLs by mouth every 4 (four) hours as needed for cough.   ibuprofen 200 MG tablet Commonly known as:  ADVIL,MOTRIN Take 600-1,000 mg by mouth every 6 (six) hours as needed for moderate pain.   mirtazapine 15 MG tablet Commonly known as:  REMERON Take 1 tablet (15 mg total) by mouth at bedtime. For depression and sleep What changed:  when to take this  reasons to take this  additional instructions   oxyCODONE-acetaminophen  5-325 MG tablet Commonly known as:  PERCOCET/ROXICET Take 1-2 tablets by mouth every 6 (six) hours as needed for severe pain.   predniSONE 10 MG tablet Commonly known as:  DELTASONE Start with 60 mg and decrease by 10 mg every day until finished   trazodone 300 MG tablet Commonly known as:  DESYREL Take 1 tablet (300 mg total) by mouth at bedtime as needed for sleep.      Follow-up Information    Prue. Go on 06/13/2016.   Why:  10am appointment Contact information: [place]  [phone]         No Active Allergies   Procedures/Studies:   Dg Chest 2 View  Result Date: 06/04/2016 CLINICAL DATA:  Shortness of breath and hemoptysis. EXAM: CHEST  2 VIEW COMPARISON:  None 22,017 FINDINGS: Cardiomediastinal silhouette is normal. Mediastinal contours appear intact. There is no evidence of pleural effusion or pneumothorax. Subtle opacity is seen overlying the left lung base on the frontal view, likely located in the left lower lobe. Osseous structures are without acute abnormality. Soft tissues are grossly normal. IMPRESSION: Subtle opacity within the left lung base. This may represent focal airspace consolidation, round atelectasis or potentially pulmonary mass. CT of the chest may be considered if found clinically necessary. Electronically Signed   By: Fidela Salisbury M.D.   On: 06/04/2016 13:21   Ct Angio Chest Pe W/cm &/or Wo Cm  Result Date: 06/04/2016 CLINICAL DATA:  Shortness of breath and hemoptysis.  Chills. EXAM: CT ANGIOGRAPHY CHEST WITH CONTRAST TECHNIQUE: Multidetector CT imaging of the chest was performed using the standard protocol during bolus administration of intravenous contrast. Multiplanar CT image reconstructions and MIPs were obtained to evaluate the vascular anatomy. CONTRAST:  100 cc Isovue 370 intravenous COMPARISON:  03/26/2012 FINDINGS: Cardiovascular: Study optimized for  the pulmonary arteries. No identified pulmonary embolism. Diminished blood flow to the left lower lobe, likely shunting in the setting of extensive airspace disease. If the extensive airspace disease was related to an embolism with infarct, would expect clot at the level of the visualized vessels. Negative aorta. Normal heart size. No pericardial effusion. Mediastinum: Mild prominence of left hilar lymph nodes considered reactive in this setting. Lungs/Pleura: Ground-glass and consolidative opacities in the left lower lobe with volume loss. Patchy ground-glass opacity in the lingula. Trace left pleural effusion. Appearance suggests pneumonia. Alveolar hemorrhage may be superimposed given the history. No airway lesion or cavitary nodule is seen. Airways are diffusely thickened which may be related to patient's smoking status. Upper abdomen: No acute findings. Musculoskeletal: No acute or aggressive finding. Review of the MIP images confirms the above findings. IMPRESSION: 1. Left basilar airspace favoring pneumonia. Some of the opacity is alveolar hemorrhage based on the history. Followup PA and lateral chest X-ray is recommended in 3-4 weeks following trial of antibiotic therapy to ensure resolution. 2. No visualized pulmonary embolism. Electronically Signed   By: Monte Fantasia M.D.   On: 06/04/2016  18:54       Discharge Exam: Vitals:   06/05/16 2215 06/06/16 0640  BP: 121/66 133/75  Pulse: (!) 103 73  Resp: 20 20  Temp: 98.1 F (36.7 C) 98.5 F (36.9 C)   Vitals:   06/05/16 1931 06/05/16 2215 06/06/16 0640 06/06/16 0806  BP:  121/66 133/75   Pulse:  (!) 103 73   Resp:  20 20   Temp:  98.1 F (36.7 C) 98.5 F (36.9 C)   TempSrc:  Oral Oral   SpO2: 98% 96% 97% 96%  Weight:      Height:        General: Pt is alert, awake, not in acute distress Cardiovascular: RRR, S1/S2 +, no rubs, no gallops Respiratory: CTA bilaterally, no wheezing, no rhonchi Abdominal: Soft, NT, ND, bowel sounds  + Extremities: no edema, no cyanosis    The results of significant diagnostics from this hospitalization (including imaging, microbiology, ancillary and laboratory) are listed below for reference.     Microbiology: Recent Results (from the past 240 hour(s))  Culture, sputum-assessment     Status: None   Collection Time: 06/04/16  9:01 PM  Result Value Ref Range Status   Specimen Description SPUTUM  Final   Special Requests NONE  Final   Sputum evaluation   Final    THIS SPECIMEN IS ACCEPTABLE. RESPIRATORY CULTURE REPORT TO FOLLOW.   Report Status 06/05/2016 FINAL  Final  Culture, respiratory (NON-Expectorated)     Status: None (Preliminary result)   Collection Time: 06/05/16 10:30 AM  Result Value Ref Range Status   Specimen Description SPUTUM  Final   Special Requests NONE  Final   Gram Stain   Final    FEW WBC PRESENT, PREDOMINANTLY PMN FEW SQUAMOUS EPITHELIAL CELLS PRESENT MODERATE GRAM POSITIVE COCCI IN PAIRS IN CHAINS FEW GRAM NEGATIVE COCCOBACILLI    Culture   Final    CULTURE REINCUBATED FOR BETTER GROWTH Performed at [facility]    Report Status PENDING  Incomplete     Labs: BNP (last 3 results) No results for input(s): BNP in the last 8760 hours. Basic Metabolic Panel:  Recent Labs Lab 06/04/16 1732 06/05/16 0340 06/06/16 0355  NA 139 139 139  K 3.3* 3.6 4.1  CL 101 106 106  CO2 30 28 26   GLUCOSE 103* 167* 164*  BUN 10 14 17   CREATININE 0.71 0.74 0.74  CALCIUM 8.8* 9.1 9.0   Liver Function Tests:  Recent Labs Lab 06/04/16 1732  AST 16  ALT 20  ALKPHOS 83  BILITOT 0.5  PROT 8.0  ALBUMIN 3.8   No results for input(s): LIPASE, AMYLASE in the last 168 hours. No results for input(s): AMMONIA in the last 168 hours. CBC:  Recent Labs Lab 06/04/16 1732 06/05/16 0340 06/06/16 0355  WBC 9.2 6.5 12.0*  NEUTROABS 6.5  --  10.3*  HGB 11.5* 12.1* 11.3*  HCT 35.2* 36.7* 34.6*  MCV 85.2 84.8 83.6  PLT 225 266 281   Cardiac  Enzymes: No results for input(s): CKTOTAL, CKMB, CKMBINDEX, TROPONINI in the last 168 hours. BNP: Invalid input(s): POCBNP CBG: No results for input(s): GLUCAP in the last 168 hours. D-Dimer No results for input(s): DDIMER in the last 72 hours. Hgb A1c No results for input(s): HGBA1C in the last 72 hours. Lipid Profile No results for input(s): CHOL, HDL, LDLCALC, TRIG, CHOLHDL, LDLDIRECT in the last 72 hours. Thyroid function studies No results for input(s): TSH, T4TOTAL, T3FREE, THYROIDAB in the last 72 hours.  Invalid input(s): FREET3 Anemia work up No results for input(s): VITAMINB12, FOLATE, FERRITIN, TIBC, IRON, RETICCTPCT in the last 72 hours. Urinalysis    Component Value Date/Time   COLORURINE AMBER (A) 06/04/2016 1915   APPEARANCEUR CLEAR 06/04/2016 1915   LABSPEC 1.038 (H) 06/04/2016 1915   PHURINE 6.0 06/04/2016 1915   GLUCOSEU NEGATIVE 06/04/2016 [place] NEGATIVE 06/04/2016 1915   BILIRUBINUR SMALL (A) 06/04/2016 1915   KETONESUR NEGATIVE 06/04/2016 1915   PROTEINUR 30 (A) 06/04/2016 1915   UROBILINOGEN 1.0 01/04/2013 2120   NITRITE NEGATIVE 06/04/2016 1915   LEUKOCYTESUR NEGATIVE 06/04/2016 1915   Sepsis Labs Invalid input(s): PROCALCITONIN,  WBC,  LACTICIDVEN Microbiology Recent Results (from the past 240 hour(s))  Culture, sputum-assessment     Status: None   Collection Time: 06/04/16  9:01 PM  Result Value Ref Range Status   Specimen Description SPUTUM  Final   Special Requests NONE  Final   Sputum evaluation   Final    THIS SPECIMEN IS ACCEPTABLE. RESPIRATORY CULTURE REPORT TO FOLLOW.   Report Status 06/05/2016 FINAL  Final  Culture, respiratory (NON-Expectorated)     Status: None (Preliminary result)   Collection Time: 06/05/16 10:30 AM  Result Value Ref Range Status   Specimen Description SPUTUM  Final   Special Requests NONE  Final   Gram Stain   Final    FEW WBC PRESENT, PREDOMINANTLY PMN FEW SQUAMOUS EPITHELIAL CELLS PRESENT MODERATE  GRAM POSITIVE COCCI IN PAIRS IN CHAINS FEW GRAM NEGATIVE COCCOBACILLI    Culture   Final    CULTURE REINCUBATED FOR BETTER GROWTH Performed at [facility]    Report Status PENDING  Incomplete     Time coordinating discharge: Over 30 minutes  SIGNED:   Debbe Odea, MD  [facility] 06/06/2016, 12:43 PM Pager   If 7PM-7AM, please contact night-coverage [email or web address] Password [email or web address]

## 2016-06-06 NOTE — Progress Notes (Signed)
Discharge instructions reviewed with patient. Patient verbalized understanding. 

## 2016-06-07 LAB — CULTURE, RESPIRATORY W GRAM STAIN

## 2016-06-07 LAB — CULTURE, RESPIRATORY: CULTURE: NORMAL

## 2016-06-10 LAB — CULTURE, BLOOD (ROUTINE X 2)
CULTURE: NO GROWTH
CULTURE: NO GROWTH

## 2016-06-13 ENCOUNTER — Inpatient Hospital Stay: Payer: Self-pay | Admitting: Critical Care Medicine

## 2016-11-14 ENCOUNTER — Ambulatory Visit (INDEPENDENT_AMBULATORY_CARE_PROVIDER_SITE_OTHER): Payer: PRIVATE HEALTH INSURANCE | Admitting: Family Medicine

## 2016-11-14 VITALS — BP 128/88 | HR 79 | Temp 97.9°F | Resp 16 | Ht 71.0 in | Wt 292.0 lb

## 2016-11-14 DIAGNOSIS — F411 Generalized anxiety disorder: Secondary | ICD-10-CM

## 2016-11-14 DIAGNOSIS — G8929 Other chronic pain: Secondary | ICD-10-CM

## 2016-11-14 DIAGNOSIS — M25571 Pain in right ankle and joints of right foot: Secondary | ICD-10-CM | POA: Diagnosis not present

## 2016-11-14 DIAGNOSIS — Z7689 Persons encountering health services in other specified circumstances: Secondary | ICD-10-CM | POA: Diagnosis not present

## 2016-11-14 DIAGNOSIS — J449 Chronic obstructive pulmonary disease, unspecified: Secondary | ICD-10-CM

## 2016-11-14 DIAGNOSIS — M25512 Pain in left shoulder: Secondary | ICD-10-CM

## 2016-11-14 MED ORDER — ALBUTEROL SULFATE (2.5 MG/3ML) 0.083% IN NEBU: 2.5000 mg | INHALATION_SOLUTION | Freq: Four times a day (QID) | RESPIRATORY_TRACT | 6 refills | Status: AC | PRN

## 2016-11-14 MED ORDER — FLUTICASONE-SALMETEROL 100-50 MCG/DOSE IN AEPB: 1.0000 | INHALATION_SPRAY | Freq: Two times a day (BID) | RESPIRATORY_TRACT | 3 refills | Status: AC

## 2016-11-14 MED ORDER — ALBUTEROL SULFATE HFA 108 (90 BASE) MCG/ACT IN AERS: 2.0000 | INHALATION_SPRAY | Freq: Four times a day (QID) | RESPIRATORY_TRACT | 2 refills | Status: AC | PRN

## 2016-11-14 MED ORDER — NAPROXEN 500 MG PO TABS: 500.0000 mg | ORAL_TABLET | Freq: Two times a day (BID) | ORAL | 0 refills | Status: DC

## 2016-11-14 NOTE — Patient Instructions (Addendum)
Thank you for coming in today, it was so nice to see you! Today we talked about:    Anxiety: I have sent a referral to psychiatry. Someone will call you to schedule this appointment  Ankle and shoulder pain: I have sent a referral to the orthopedic specialist. Someone will call you to schedule this appointment.  COPD/Asthma: I have refilled your medications.   Please follow up in 4-6 weeks to talk about your COPD. You can schedule this appointment at the front desk before you leave or call the clinic.   IF you received an x-ray today, you will receive an invoice from [facility]. Please contact [facility] at [phone] with questions or concerns regarding your invoice.   IF you received labwork today, you will receive an invoice from [facility]. Please contact [facility] at [phone] with questions or concerns regarding your invoice.   Our billing staff will not be able to assist you with questions regarding bills from these companies.  You will be contacted with the lab results as soon as they are available. The fastest way to get your results is to activate your My Chart account. Instructions are located on the last page of this paperwork. If you have not heard from [place] regarding the results in 2 weeks, please contact this office.

## 2016-11-14 NOTE — Progress Notes (Signed)
Danny Hale is a 41 y.o. male who presents to [place] at [facility] today for  Chief Complaint  Patient presents with  . Establish Care  . Medication Refill    all meds   Patient here to establish care today. He states that his doctor quit. He also has the following complaints:   1. Left shoulder pain: [place] from a standing position on his left shoulder after he tripped over a toy on the floor. He stated that it started to hurt more and more. Has been taking Ibuprofen for pain but doesn't help at all. Tylenol hasn't worked for him.  2. Right ankle pain: Patient states that he has had chronic right ankle pain. He has had surgery on his ankle in the past. He states he has 9 screws in his right ankle. 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  3. Anxiety: Patient states he has been on xanax for years now. Denies any panic attacks. States he picks his skin when he is anxious. He is hoping we can give him refills on his medications today.  4. COPD/Asthma: Uses his albuterol and controller inhaler daily. Uses albuterol once daily. Supposed to sleep with oxygen at night but doesn't. He does not give a reason why he does not use his oxygen. Smokes about 1/4 PPD.   ROS as above.  Pertinently, no chest pain, palpitations, SOB, Fever, Chills, Abd pain, N/V/D.   PMH reviewed.  Past Medical History:  Diagnosis Date  . Asthma   . Cancer (HCC)    Lower Lip  . Cigarette smoker   . COPD (chronic obstructive pulmonary disease) ([place])   . Emphysema   . Obesity   ADD  Past Surgical History:  Procedure Laterality Date  . ANKLE SURGERY    . cancer removal      Medications reviewed. Current Outpatient Prescriptions  Medication Sig Dispense Refill  . albuterol (PROVENTIL HFA;VENTOLIN HFA) 108 (90 Base) MCG/ACT inhaler Inhale 2 puffs into the lungs every 6 (six) hours as needed for wheezing or shortness of breath. 1 Inhaler 2  . albuterol (PROVENTIL) (2.5 MG/3ML) 0.083%  nebulizer solution Take 2.5 mg by nebulization every 6 (six) hours as needed for wheezing or shortness of breath.    . ALPRAZolam (XANAX) 0.5 MG tablet Take 0.5 mg by mouth 2 (two) times daily.  1  . amphetamine-dextroamphetamine (ADDERALL) 30 MG tablet Take 30 mg by mouth 2 (two) times daily.  0  . oxyCODONE-acetaminophen (PERCOCET/ROXICET) 5-325 MG tablet Take 1-2 tablets by mouth every 6 (six) hours as needed for severe pain. 10 tablet 0  . cefpodoxime (VANTIN) 200 MG tablet Take 1 tablet (200 mg total) by mouth 2 (two) times daily. (Patient not taking: Reported on 11/14/2016) 10 tablet 0  . guaiFENesin-dextromethorphan (ROBITUSSIN DM) 100-10 MG/5ML syrup Take 5 mLs by mouth every 4 (four) hours as needed for cough. (Patient not taking: Reported on 11/14/2016) 118 mL 0  . ibuprofen (ADVIL,MOTRIN) 200 MG tablet Take 600-1,000 mg by mouth every 6 (six) hours as needed for moderate pain.    . mirtazapine (REMERON) 15 MG tablet Take 1 tablet (15 mg total) by mouth at bedtime. For depression and sleep (Patient not taking: Reported on 11/14/2016) 30 tablet 0  . predniSONE (DELTASONE) 10 MG tablet Start with 60 mg and decrease by 10 mg every day until finished (Patient not taking: Reported on 11/14/2016) 21 tablet 0  . traZODone (DESYREL) 300  MG tablet Take 1 tablet (300 mg total) by mouth at bedtime as needed for sleep. (Patient not taking: Reported on 06/04/2016) 30 tablet 0   No current facility-administered medications for this visit.    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   Vital signs reviewed. HEENT: EOMI,  MMM Pulm:  Clear to auscultation bilaterally with good air movement. No wheezes or rales noted. Normal work of breathing Cardiac: Regular rate and rhythm without murmur auscultated. Good S1/S2. Abd: Soft/nondistended/nontender. Good bowel sounds throughout all four quadrants. No masses noted.  Exts: Non edematous BL LE, warm and well perfused.  MSK: Full range of motion of all joints with exception of limited left arm abduction secondary to pain. 5/5 strength in bilateral upper and lower extremities    Assessment and Plan:  1. Visit to establish care: Updated patient's medical history. He refused the flu shot. Recently had lab work in August 2017.   2.  Left shoulder pain: Pain likely secondary to trauma and repetitive shoulder lifting at work as an Clinical biochemist. Possible rotator cuff tendinopathy - Referral to orthopedic specialist placed - Naproxen prescribed  3. Right ankle pain: Chronic and worsening. Hx of surgery on the ankle. Takes Percocet PRN - Referral to orthopedic specialist placed  4. Anxiety and ADD: Per epic has had multiple inpatient psychiatric admissions.Takes Xanax at home as well as Adderral. He was hoping to have these refilled today. Discussed that we do not refill controlled substances at first visit to establish care. Patient upset about this. Discussed that he will need to see a psychiatrist.  - Referral placed to psychiatry  5. COPD/Asthma: Symptoms seem to be uncontrolled per patient report. He takes Advair and Albuterol at home. States he's supposed to be on oxygen at night but isn't. Respiratory exam within normal limits today - Continue Albuterol PRN - Continue Advair daily - Encouraged smoking cessation - Advised patient to please schedule an appointment to discuss Asthma/COPD at our clinic  Smitty Cords, MD [place], PGY-2

## 2016-11-15 ENCOUNTER — Ambulatory Visit: Payer: Self-pay | Admitting: Medical

## 2016-11-20 ENCOUNTER — Ambulatory Visit (INDEPENDENT_AMBULATORY_CARE_PROVIDER_SITE_OTHER): Payer: Self-pay

## 2016-11-20 ENCOUNTER — Encounter (INDEPENDENT_AMBULATORY_CARE_PROVIDER_SITE_OTHER): Payer: Self-pay | Admitting: Orthopedic Surgery

## 2016-11-20 ENCOUNTER — Telehealth: Payer: Self-pay

## 2016-11-20 ENCOUNTER — Ambulatory Visit (INDEPENDENT_AMBULATORY_CARE_PROVIDER_SITE_OTHER): Payer: PRIVATE HEALTH INSURANCE | Admitting: Orthopedic Surgery

## 2016-11-20 VITALS — Ht 71.0 in | Wt 292.0 lb

## 2016-11-20 DIAGNOSIS — M25571 Pain in right ankle and joints of right foot: Secondary | ICD-10-CM

## 2016-11-20 DIAGNOSIS — M25512 Pain in left shoulder: Secondary | ICD-10-CM

## 2016-11-20 MED ORDER — METHYLPREDNISOLONE ACETATE 40 MG/ML IJ SUSP
40.0000 mg | INTRAMUSCULAR | Status: AC | PRN
  Administered 2016-11-20: 40 mg via INTRA_ARTICULAR

## 2016-11-20 MED ORDER — LIDOCAINE HCL 1 % IJ SOLN
5.0000 mL | INTRAMUSCULAR | Status: AC | PRN
  Administered 2016-11-20: 5 mL

## 2016-11-20 NOTE — Progress Notes (Signed)
Office Visit Note   Patient: Danny Hale           Date of Birth: March 15, 1976           MRN: [id number] Visit Date: 11/20/2016              Requested by: Carlyle Dolly, MD [address] [place], [place] [address] PCP: Delia Chimes, NP  Chief Complaint  Patient presents with  . Right Ankle - Pain  . Left Shoulder - Pain    HPI: Patient is a 41 y.o male here for evaluation of right shoulder and left ankle pain. He is status post fall 2-3 months ago trying to catching himself when he fell injuring left shoulder. Pain has got increasingly worse. He has decreased range of motion and difficulty with activities of daily living. He was given rx for naproxen he states this gave him no relief. He was a former patient of Dr. Toy Cookey who is no longer seeing patients.   Patient states he rolled right ankle when falling. History of 9 screws in ankle due to previous ankle fracture approximately 10 years ago. 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 He has difficulty ambulating short distances some day. Patient has pain localized to ankle joint. Denies any noticeable swelling. Maxcine Ham, RT    Assessment & Plan: Visit Diagnoses:  1. Pain in right ankle and joints of right foot   2. Acute pain of left shoulder     Plan: Recommended a fracture boot for the right ankle due to his knee ankle sprain. Patient states he cannot wear her fracture boot for work and we will place him in an ankle stabilizing orthosis. The left shoulder was injected. Discussed that this will be diagnostic and therapeutic. Anticipate that his injury left shoulder is only a contusion and not a full-thickness rotator cuff tear.  Follow-Up Instructions: Return in about 4 weeks (around 12/18/2016).   Ortho Exam Examination patient is alert oriented no adenopathy well-dressed normal affect normal respiratory effort he does have an antalgic gait. Examination of the right ankle he has good pulses he  has good ankle good subtalar motion. He is tender to palpation over the lateral ankle ligaments as well as over the deltoid. Anterior drawer is stable with approximately 2 mm of anterior translation. Semination the left shoulder has abduction and flexion to 70 he has pain with Neer and Hawkins impingement test pain with drop arm test the before meals joint is nontender to palpation. His primary tender to palpation over the anterior deltoid the biceps tendon is tender to palpation.  Imaging: Xr Ankle Complete Right  Result Date: 11/20/2016 Three-view radiographs of the right ankle show stable internal fixation from previous Weber C fibular fracture with syndesmotic screws intact. The joint is congruent.  Xr Shoulder Left  Result Date: 11/20/2016 Three-view radiographs of the left shoulder shows a congruent joint no before meals separation no osteochondral defect no Hill-Sachs lesion.   Orders:  Orders Placed This Encounter  Procedures  . XR Ankle Complete Right  . XR Shoulder Left   No orders of the defined types were placed in this encounter.    Procedures: Large Joint Inj Date/Time: 11/20/2016 4:20 PM Performed by: Newt Minion Authorized by: Newt Minion   Consent Given by:  Patient Site marked: the procedure site was marked   Timeout: prior to procedure the correct patient, procedure, and site was verified   Indications:  Pain  and diagnostic evaluation Location:  Shoulder Site:  L subacromial bursa Prep: patient was prepped and draped in usual sterile fashion   Needle Size:  22 G Needle Length:  1.5 inches Approach:  Posterior Ultrasound Guidance: No   Fluoroscopic Guidance: No   Arthrogram: No   Medications:  5 mL lidocaine 1 %; 40 mg methylPREDNISolone acetate 40 MG/ML Aspiration Attempted: No   Patient tolerance:  Patient tolerated the procedure well with no immediate complications    Clinical Data: No additional findings.  Subjective: Review of  Systems  Objective: Vital Signs: Ht 5\' 11"  (1.803 m)   Wt 292 lb (132.5 kg)   BMI 40.73 kg/m   Specialty Comments:  No specialty comments available.  PMFS History: Patient Active Problem List   Diagnosis Date Noted  . 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 CAP (community acquired pneumonia) 06/04/2016  . Hemoptysis 06/04/2016  . Left lower lobe pneumonia ([place]) 06/04/2016  . GAD (generalized anxiety disorder) 01/18/2015  . Polysubstance dependence including opioid type drug, continuous use ([place]) 01/13/2015  . Substance induced mood disorder ([place]) 01/12/2015  . Chronic pain of right ankle 05/20/2014  . Adjustment disorder with anxious mood 05/18/2014  . Asthma with acute exacerbation 05/18/2014  . Pain in joint, lower leg 05/18/2014  . Status asthmaticus with COPD (chronic obstructive pulmonary disease) ([place]) 03/28/2014  . Asthma exacerbation 01/05/2013  . Status asthmaticus 01/05/2013   Past Medical History:  Diagnosis Date  . ADD (attention deficit disorder)   . Asthma   . Cancer (HCC)    Lower Lip  . Cigarette smoker   . COPD (chronic obstructive pulmonary disease) ([place])   . Emphysema   . Obesity     Family History  Problem Relation Age of Onset  . Birth defects Sister   . Heart disease Maternal Grandfather   . COPD Neg Hx     Past Surgical History:  Procedure Laterality Date  . ANKLE SURGERY    . cancer removal     Social History   Occupational History  . Not on file.   Social History Main Topics  . Smoking status: Current Every Day Smoker    Packs/day: 3.00    Types: Cigarettes, Cigars  . Smokeless tobacco: Never Used  . Alcohol use No     Comment: occ  . Drug use: No  . Sexual activity: Yes    Birth control/ protection: None

## 2016-11-20 NOTE — Telephone Encounter (Signed)
Psychiatry referral sent to [facility] through the wq (limited access) as they are the only local providers who accept his insurance.

## 2016-12-01 ENCOUNTER — Emergency Department (HOSPITAL_COMMUNITY)
Admission: EM | Admit: 2016-12-01 | Discharge: 2016-12-02 | Disposition: A | Discharge status: Home / Self Care | Payer: PRIVATE HEALTH INSURANCE | Attending: Emergency Medicine | Admitting: Emergency Medicine

## 2016-12-01 ENCOUNTER — Encounter (HOSPITAL_COMMUNITY): Payer: Self-pay | Admitting: Emergency Medicine

## 2016-12-01 DIAGNOSIS — R358 Other polyuria: Secondary | ICD-10-CM | POA: Insufficient documentation

## 2016-12-01 DIAGNOSIS — Y999 Unspecified external cause status: Secondary | ICD-10-CM | POA: Insufficient documentation

## 2016-12-01 DIAGNOSIS — Z79899 Other long term (current) drug therapy: Secondary | ICD-10-CM | POA: Diagnosis not present

## 2016-12-01 DIAGNOSIS — M25512 Pain in left shoulder: Secondary | ICD-10-CM | POA: Insufficient documentation

## 2016-12-01 DIAGNOSIS — F909 Attention-deficit hyperactivity disorder, unspecified type: Secondary | ICD-10-CM | POA: Insufficient documentation

## 2016-12-01 DIAGNOSIS — R3589 Other polyuria: Secondary | ICD-10-CM

## 2016-12-01 DIAGNOSIS — F17210 Nicotine dependence, cigarettes, uncomplicated: Secondary | ICD-10-CM | POA: Insufficient documentation

## 2016-12-01 DIAGNOSIS — J449 Chronic obstructive pulmonary disease, unspecified: Secondary | ICD-10-CM | POA: Insufficient documentation

## 2016-12-01 DIAGNOSIS — Y92410 Unspecified street and highway as the place of occurrence of the external cause: Secondary | ICD-10-CM | POA: Diagnosis not present

## 2016-12-01 DIAGNOSIS — Y939 Activity, unspecified: Secondary | ICD-10-CM | POA: Diagnosis not present

## 2016-12-01 LAB — URINALYSIS, ROUTINE W REFLEX MICROSCOPIC
BACTERIA UA: NONE SEEN
Bilirubin Urine: NEGATIVE
Glucose, UA: NEGATIVE mg/dL
Hgb urine dipstick: NEGATIVE
KETONES UR: NEGATIVE mg/dL
Nitrite: NEGATIVE
PROTEIN: NEGATIVE mg/dL
SQUAMOUS EPITHELIAL / LPF: NONE SEEN
Specific Gravity, Urine: 1.024 (ref 1.005–1.030)
pH: 5 (ref 5.0–8.0)

## 2016-12-01 LAB — BASIC METABOLIC PANEL
ANION GAP: 11 (ref 5–15)
BUN: 13 mg/dL (ref 6–20)
CHLORIDE: 103 mmol/L (ref 101–111)
CO2: 24 mmol/L (ref 22–32)
Calcium: 9 mg/dL (ref 8.9–10.3)
Creatinine, Ser: 1.04 mg/dL (ref 0.61–1.24)
GFR calc non Af Amer: >60 mL/min (ref >60)
Glucose, Bld: 98 mg/dL (ref 65–99)
POTASSIUM: 4.1 mmol/L (ref 3.5–5.1)
SODIUM: 138 mmol/L (ref 135–145)

## 2016-12-01 LAB — CBC
HCT: 40.8 % (ref 39.0–52.0)
HEMOGLOBIN: 13.2 g/dL (ref 13.0–17.0)
MCH: 27.4 pg (ref 26.0–34.0)
MCHC: 32.4 g/dL (ref 30.0–36.0)
MCV: 84.6 fL (ref 78.0–100.0)
Platelets: 184 10*3/uL (ref 150–400)
RBC: 4.82 MIL/uL (ref 4.22–5.81)
RDW: 14 % (ref 11.5–15.5)
WBC: 6.3 10*3/uL (ref 4.0–10.5)

## 2016-12-01 MED ORDER — ONDANSETRON 4 MG PO TBDP
8.0000 mg | ORAL_TABLET | Freq: Once | ORAL | Status: AC
  Administered 2016-12-01: 8 mg via ORAL
  Filled 2016-12-01: qty 2

## 2016-12-01 MED ORDER — HYDROCODONE-ACETAMINOPHEN 5-325 MG PO TABS
1.0000 | ORAL_TABLET | Freq: Once | ORAL | Status: AC
  Administered 2016-12-01: 1 via ORAL
  Filled 2016-12-01: qty 1

## 2016-12-01 MED ORDER — IBUPROFEN 800 MG PO TABS
800.0000 mg | ORAL_TABLET | Freq: Once | ORAL | Status: AC
  Administered 2016-12-01: 800 mg via ORAL
  Filled 2016-12-01: qty 1
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
## 2016-12-02 MED ORDER — IBUPROFEN 600 MG PO TABS: 600.0000 mg | ORAL_TABLET | Freq: Four times a day (QID) | ORAL | 0 refills | Status: AC | PRN

## 2016-12-02 MED ORDER — CYCLOBENZAPRINE HCL 10 MG PO TABS
10.0000 mg | ORAL_TABLET | Freq: Three times a day (TID) | ORAL | 0 refills | Status: AC | PRN
Start: 2016-12-02 — End: ?

## 2016-12-06 ENCOUNTER — Ambulatory Visit (INDEPENDENT_AMBULATORY_CARE_PROVIDER_SITE_OTHER): Payer: PRIVATE HEALTH INSURANCE | Admitting: Orthopedic Surgery

## 2016-12-06 ENCOUNTER — Encounter (INDEPENDENT_AMBULATORY_CARE_PROVIDER_SITE_OTHER): Payer: Self-pay | Admitting: Orthopedic Surgery

## 2016-12-06 ENCOUNTER — Ambulatory Visit (INDEPENDENT_AMBULATORY_CARE_PROVIDER_SITE_OTHER): Payer: Self-pay

## 2016-12-06 DIAGNOSIS — M25512 Pain in left shoulder: Secondary | ICD-10-CM

## 2016-12-06 DIAGNOSIS — G8929 Other chronic pain: Secondary | ICD-10-CM

## 2016-12-06 DIAGNOSIS — M542 Cervicalgia: Secondary | ICD-10-CM
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
## 2016-12-18 ENCOUNTER — Ambulatory Visit (INDEPENDENT_AMBULATORY_CARE_PROVIDER_SITE_OTHER): Payer: PRIVATE HEALTH INSURANCE | Admitting: Orthopedic Surgery

## 2016-12-21 ENCOUNTER — Ambulatory Visit
Admission: RE | Admit: 2016-12-21 | Discharge: 2016-12-21 | Disposition: A | Discharge status: Home / Self Care | Payer: PRIVATE HEALTH INSURANCE | Source: Ambulatory Visit | Attending: Orthopedic Surgery | Admitting: Orthopedic Surgery

## 2016-12-21 DIAGNOSIS — M25512 Pain in left shoulder: Principal | ICD-10-CM

## 2016-12-21 DIAGNOSIS — G8929 Other chronic pain: Secondary | ICD-10-CM

## 2016-12-26 ENCOUNTER — Encounter (INDEPENDENT_AMBULATORY_CARE_PROVIDER_SITE_OTHER): Payer: Self-pay | Admitting: Orthopedic Surgery

## 2016-12-26 ENCOUNTER — Ambulatory Visit (INDEPENDENT_AMBULATORY_CARE_PROVIDER_SITE_OTHER): Payer: PRIVATE HEALTH INSURANCE | Admitting: Orthopedic Surgery

## 2016-12-26 DIAGNOSIS — M7542 Impingement syndrome of left shoulder: Secondary | ICD-10-CM | POA: Diagnosis not present

## 2016-12-26 DIAGNOSIS — G8929 Other chronic pain: Secondary | ICD-10-CM | POA: Diagnosis not present

## 2016-12-26 DIAGNOSIS — M25512 Pain in left shoulder: Secondary | ICD-10-CM

## 2016-12-26 NOTE — Progress Notes (Signed)
Office Visit Note   Patient: Danny Hale           Date of Birth: 03/07/1976           MRN: [id number] Visit Date: 12/26/2016              Requested by: Delia Chimes, NP No address on file PCP: Delia Chimes, NP (Inactive)  Chief Complaint  Patient presents with  . Left Shoulder - Follow-up    MRI left shoulder    HPI: The patient is a 41 year old gentleman who is seen today in follow-up for shoulder pain on the left. Did have a cortisone injection a little over a month ago. States this provided minimal relief. Has ibuprofen with wall relief as well. States he needs to get well state he can continue with work. 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 Majority of his his pain is with above head reaching. Does have pain that is constant and aching down the upper left arm. is worse at night.  here for MRI reviewed. MRI negative for tears. Does have tendinosis.    Assessment & Plan: Visit Diagnoses:  1. Chronic left shoulder pain   2. Impingement syndrome of left shoulder     Plan: Cortisone injection left shoulder today. Encouraged him to continue with nonsteroidals. May use ice. Follow-up in office for continued pain.  Follow-Up Instructions: Return in about 4 weeks (around 01/23/2017), or if symptoms worsen or fail to improve.   Physical Exam  Constitutional: Appears well-developed.  Head: Normocephalic.  Eyes: EOM are normal.  Neck: Normal range of motion.  Cardiovascular: Normal rate.   Pulmonary/Chest: Effort normal.  Neurological: Is alert.  Skin: Skin is warm.  Psychiatric: Has a normal mood and affect.  Left Shoulder Exam   Tenderness  Left shoulder tenderness location: Global tenderness.  Range of Motion  The patient has normal left shoulder ROM.  Tests  Drop Arm: negative Impingement: positive  Other  Pulse: present        Imaging: No results found.  Labs: Lab Results  Component Value Date   REPTSTATUS  06/07/2016 FINAL 06/05/2016   GRAMSTAIN  06/05/2016    FEW WBC PRESENT, PREDOMINANTLY PMN FEW SQUAMOUS EPITHELIAL CELLS PRESENT MODERATE GRAM POSITIVE COCCI IN PAIRS IN CHAINS FEW GRAM NEGATIVE COCCOBACILLI    CULT  06/05/2016    Consistent with normal respiratory flora. Performed at [facility]     Orders:  No orders of the defined types were placed in this encounter.  No orders of the defined types were placed in this encounter.    Procedures: No procedures performed  Clinical Data: No additional findings.  Subjective: Review of Systems  Constitutional: Negative for chills and fever.  Musculoskeletal: Positive for arthralgias and myalgias.    Objective: Vital Signs: There were no vitals taken for this visit.  Specialty Comments:  No specialty comments available.  PMFS History: Patient Active Problem List   Diagnosis Date Noted  . Neck pain 12/06/2016  . Chronic left shoulder pain 12/06/2016  . 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 CAP (community acquired pneumonia) 06/04/2016  . Hemoptysis 06/04/2016  . Left lower lobe pneumonia ([place]) 06/04/2016  . GAD (generalized anxiety disorder) 01/18/2015  . Polysubstance dependence including opioid type drug, continuous use ([place]) 01/13/2015  . Substance induced mood disorder ([place]) 01/12/2015  . Chronic pain of right ankle 05/20/2014  . Adjustment disorder with anxious mood 05/18/2014  .  Asthma with acute exacerbation 05/18/2014  . Pain in joint, lower leg 05/18/2014  . Status asthmaticus with COPD (chronic obstructive pulmonary disease) ([place]) 03/28/2014  . Asthma exacerbation 01/05/2013  . Status asthmaticus 01/05/2013   Past Medical History:  Diagnosis Date  . ADD (attention deficit disorder)   . Asthma   . Cancer (HCC)    Lower Lip  . Cigarette smoker   . COPD (chronic obstructive pulmonary disease) ([place])   . Emphysema   . Obesity     Family History  Problem Relation Age of Onset  . Birth  defects Sister   . Heart disease Maternal Grandfather   . COPD Neg Hx     Past Surgical History:  Procedure Laterality Date  . ANKLE SURGERY    . cancer removal     Social History   Occupational History  . Not on file.   Social History Main Topics  . Smoking status: Current Every Day Smoker    Packs/day: 0.50    Types: Cigarettes, Cigars  . Smokeless tobacco: Never Used  . Alcohol use No     Comment: occ  . Drug use: No  . Sexual activity: Yes    Birth control/ protection: None

## 2016-12-27 ENCOUNTER — Ambulatory Visit (HOSPITAL_COMMUNITY): Payer: Self-pay | Admitting: Psychiatry

## 2017-04-13 ENCOUNTER — Emergency Department (HOSPITAL_COMMUNITY)
Admission: EM | Admit: 2017-04-13 | Discharge: 2017-04-14 | Disposition: A | Discharge status: Home / Self Care | Payer: Self-pay | Attending: Emergency Medicine | Admitting: Emergency Medicine

## 2017-04-13 ENCOUNTER — Encounter (HOSPITAL_COMMUNITY): Payer: Self-pay

## 2017-04-13 DIAGNOSIS — J449 Chronic obstructive pulmonary disease, unspecified: Secondary | ICD-10-CM | POA: Insufficient documentation

## 2017-04-13 DIAGNOSIS — F1910 Other psychoactive substance abuse, uncomplicated: Secondary | ICD-10-CM | POA: Insufficient documentation

## 2017-04-13 DIAGNOSIS — F17210 Nicotine dependence, cigarettes, uncomplicated: Secondary | ICD-10-CM | POA: Insufficient documentation

## 2017-04-13 DIAGNOSIS — J45909 Unspecified asthma, uncomplicated: Secondary | ICD-10-CM | POA: Insufficient documentation

## 2017-04-13 DIAGNOSIS — Z79899 Other long term (current) drug therapy: Secondary | ICD-10-CM | POA: Insufficient documentation

## 2017-04-13 DIAGNOSIS — T50901A Poisoning by unspecified drugs, medicaments and biological substances, accidental (unintentional), initial encounter: Secondary | ICD-10-CM

## 2017-04-13 LAB — COMPREHENSIVE METABOLIC PANEL
ALBUMIN: 4.7 g/dL (ref 3.5–5.0)
ALT: 41 U/L (ref 17–63)
AST: 45 U/L — AB (ref 15–41)
Alkaline Phosphatase: 80 U/L (ref 38–126)
Anion gap: 11 (ref 5–15)
BILIRUBIN TOTAL: 0.5 mg/dL (ref 0.3–1.2)
BUN: 20 mg/dL (ref 6–20)
CO2: 26 mmol/L (ref 22–32)
CREATININE: 1.62 mg/dL — AB (ref 0.61–1.24)
Calcium: 8.9 mg/dL (ref 8.9–10.3)
Chloride: 104 mmol/L (ref 101–111)
GFR calc Af Amer: >60 mL/min (ref >60)
GFR calc non Af Amer: 52 mL/min — ABNORMAL LOW (ref >60)
GLUCOSE: 149 mg/dL — AB (ref 65–99)
Potassium: 3.7 mmol/L (ref 3.5–5.1)
Sodium: 141 mmol/L (ref 135–145)
Total Protein: 8.6 g/dL — ABNORMAL HIGH (ref 6.5–8.1)

## 2017-04-13 LAB — CBC WITH DIFFERENTIAL/PLATELET
BASOS ABS: 0 10*3/uL (ref 0.0–0.1)
BASOS PCT: 0 %
EOS ABS: 0.1 10*3/uL (ref 0.0–0.7)
Eosinophils Relative: 1 %
HEMATOCRIT: 44.3 % (ref 39.0–52.0)
Hemoglobin: 15 g/dL (ref 13.0–17.0)
Lymphocytes Relative: 7 %
Lymphs Abs: 1.3 10*3/uL (ref 0.7–4.0)
MCH: 29.1 pg (ref 26.0–34.0)
MCHC: 33.9 g/dL (ref 30.0–36.0)
MCV: 86 fL (ref 78.0–100.0)
MONOS PCT: 4 %
Monocytes Absolute: 0.7 10*3/uL (ref 0.1–1.0)
NEUTROS ABS: 15.8 10*3/uL — AB (ref 1.7–7.7)
Neutrophils Relative %: 88 %
Platelets: 233 10*3/uL (ref 150–400)
RBC: 5.15 MIL/uL (ref 4.22–5.81)
RDW: 13.3 % (ref 11.5–15.5)
WBC: 17.9 10*3/uL — ABNORMAL HIGH (ref 4.0–10.5)

## 2017-04-13 LAB — URINALYSIS, ROUTINE W REFLEX MICROSCOPIC
Bilirubin Urine: NEGATIVE
GLUCOSE, UA: NEGATIVE mg/dL
Hgb urine dipstick: NEGATIVE
Ketones, ur: NEGATIVE mg/dL
Leukocytes, UA: NEGATIVE
NITRITE: NEGATIVE
PH: 5 (ref 5.0–8.0)
Protein, ur: 100 mg/dL — AB
SPECIFIC GRAVITY, URINE: 1.023 (ref 1.005–1.030)

## 2017-04-13 LAB — ACETAMINOPHEN LEVEL: Acetaminophen (Tylenol), Serum: <10 ug/mL — ABNORMAL LOW (ref 10–30)

## 2017-04-13 LAB — RAPID URINE DRUG SCREEN, HOSP PERFORMED
AMPHETAMINES: POSITIVE — AB
BARBITURATES: NOT DETECTED
Benzodiazepines: POSITIVE — AB
COCAINE: POSITIVE — AB
Opiates: POSITIVE — AB
TETRAHYDROCANNABINOL: POSITIVE — AB

## 2017-04-13 LAB — ETHANOL: Alcohol, Ethyl (B): <5 mg/dL (ref <5)

## 2017-04-13 LAB — SALICYLATE LEVEL

## 2017-04-13 LAB — CBG MONITORING, ED: Glucose-Capillary: 148 mg/dL — ABNORMAL HIGH (ref 65–99)

## 2017-04-13 MED ORDER — SODIUM CHLORIDE 0.9 % IV SOLN
INTRAVENOUS | Status: DC
  Administered 2017-04-13 (×2): via INTRAVENOUS

## 2017-04-13 MED ORDER — ONDANSETRON HCL 4 MG/2ML IJ SOLN
4.0000 mg | Freq: Once | INTRAMUSCULAR | Status: AC
  Administered 2017-04-13: 4 mg via INTRAVENOUS
  Filled 2017-04-13: qty 2

## 2017-04-13 NOTE — ED Provider Notes (Signed)
[facility] DEPT Provider Note   CSN: [id number] Arrival date & time: 04/13/17  1708     History   Chief Complaint Chief Complaint  Patient presents with  . Drug Overdose    HPI Danny Hale is a 41 y.o. male.  Pt presents to the ED today with possible opiate overdose.  Pt was found in a local park's parking lot unresponsive.  EMS was notified.  Pt's RR was only 4, so they gave pt 2 mg of intranasal narcan.  The pt is now awake and alert.  He denies taking too much pain meds.  Per EMS, pt's father told them that pt has a problem with "oxys."      Past Medical History:  Diagnosis Date  . ADD (attention deficit disorder)   . Asthma   . Cancer (HCC)    Lower Lip  . Cigarette smoker   . COPD (chronic obstructive pulmonary disease) ([place])   . Emphysema   . Obesity     Patient Active Problem List   Diagnosis Date Noted  . Neck pain 12/06/2016  . Chronic left shoulder pain 12/06/2016  . 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 CAP (community acquired pneumonia) 06/04/2016  . Hemoptysis 06/04/2016  . Left lower lobe pneumonia ([place]) 06/04/2016  . GAD (generalized anxiety disorder) 01/18/2015  . Polysubstance dependence including opioid type drug, continuous use ([place]) 01/13/2015  . Substance induced mood disorder ([place]) 01/12/2015  . Chronic pain of right ankle 05/20/2014  . Adjustment disorder with anxious mood 05/18/2014  . Asthma with acute exacerbation 05/18/2014  . Pain in joint, lower leg 05/18/2014  . Status asthmaticus with COPD (chronic obstructive pulmonary disease) ([place]) 03/28/2014  . Asthma exacerbation 01/05/2013  . Status asthmaticus 01/05/2013    Past Surgical History:  Procedure Laterality Date  . ANKLE SURGERY    . cancer removal         Home Medications    Prior to Admission medications   Medication Sig Start Date End Date Taking? Authorizing Provider  ALPRAZolam Duanne Moron) 0.5 MG tablet Take 0.5 mg by mouth 2 (two) times daily.  08/30/15  Yes [provider]  amphetamine-dextroamphetamine (ADDERALL) 30 MG tablet Take 30 mg by mouth 3 (three) times daily.  09/06/15  Yes [provider]  albuterol (PROVENTIL HFA;VENTOLIN HFA) 108 (90 Base) MCG/ACT inhaler Inhale 2 puffs into the lungs every 6 (six) hours as needed for wheezing or shortness of breath. 11/14/16   Carlyle Dolly, MD  albuterol (PROVENTIL) (2.5 MG/3ML) 0.083% nebulizer solution Take 3 mLs (2.5 mg total) by nebulization every 6 (six) hours as needed for wheezing or shortness of breath. 11/14/16   Carlyle Dolly, MD  cyclobenzaprine (FLEXERIL) 10 MG tablet Take 1 tablet (10 mg total) by mouth 3 (three) times daily as needed for muscle spasms. 12/02/16   [name], MD  Fluticasone-Salmeterol (ADVAIR) 100-50 MCG/DOSE AEPB Inhale 1 puff into the lungs 2 (two) times daily. 11/14/16   Carlyle Dolly, MD  ibuprofen (ADVIL,MOTRIN) 600 MG tablet Take 1 tablet (600 mg total) by mouth every 6 (six) hours as needed. 12/02/16   [name], MD  mirtazapine (REMERON) 15 MG tablet Take 1 tablet (15 mg total) by mouth at bedtime. For depression and sleep 01/19/15   Rankin, Shuvon B, NP    Family History Family History  Problem Relation Age of Onset  . Birth defects Sister   . Heart disease Maternal Grandfather   . COPD Neg Hx  Social History Social History  Substance Use Topics  . Smoking status: Current Every Day Smoker    Packs/day: 0.50    Types: Cigarettes, Cigars  . Smokeless tobacco: Never Used  . Alcohol use No     Comment: occ     Allergies   Patient has no known allergies.   Review of Systems Review of Systems  All other systems reviewed and are negative.    Physical Exam Updated Vital Signs BP (!) 141/95   Pulse 89   Temp 98.6 F (37 C) (Oral)   Resp 10   Ht 6' (1.829 m)   Wt 136.1 kg (300 lb)   SpO2 95%   BMI 40.69 kg/m   Physical Exam  Constitutional: He is oriented to person, place, and  time. He appears well-developed and well-nourished.  HENT:  Head: Normocephalic and atraumatic.  Right Ear: External ear normal.  Left Ear: External ear normal.  Nose: Nose normal.  Mouth/Throat: Oropharynx is clear and moist.  Eyes: Conjunctivae and EOM are normal. Pupils are equal, round, and reactive to light.  Neck: Normal range of motion. Neck supple.  Cardiovascular: Normal rate, regular rhythm, normal heart sounds and intact distal pulses.   Pulmonary/Chest: Effort normal and breath sounds normal.  Abdominal: Soft. Bowel sounds are normal.  Musculoskeletal: Normal range of motion.  Neurological: He is alert and oriented to person, place, and time.  Skin: Skin is warm. Capillary refill takes less than 2 seconds.  Psychiatric: He has a normal mood and affect. His behavior is normal. Judgment and thought content normal.  Nursing note and vitals reviewed.    ED Treatments / Results  Labs (all labs ordered are listed, but only abnormal results are displayed) Labs Reviewed  COMPREHENSIVE METABOLIC PANEL - Abnormal; Notable for the following:       Result Value   Glucose, Bld 149 (*)    Creatinine, Ser 1.62 (*)    Total Protein 8.6 (*)    AST 45 (*)    GFR calc non Af Amer 52 (*)    All other components within normal limits  ACETAMINOPHEN LEVEL - Abnormal; Notable for the following:    Acetaminophen (Tylenol), Serum <10 (*)    All other components within normal limits  RAPID URINE DRUG SCREEN, HOSP PERFORMED - Abnormal; Notable for the following:    Opiates POSITIVE (*)    Cocaine POSITIVE (*)    Benzodiazepines POSITIVE (*)    Amphetamines POSITIVE (*)    Tetrahydrocannabinol POSITIVE (*)    All other components within normal limits  CBC WITH DIFFERENTIAL/PLATELET - Abnormal; Notable for the following:    WBC 17.9 (*)    Neutro Abs 15.8 (*)    All other components within normal limits  URINALYSIS, ROUTINE W REFLEX MICROSCOPIC - Abnormal; Notable for the following:     APPearance CLOUDY (*)    Protein, ur 100 (*)    Bacteria, UA RARE (*)    Squamous Epithelial / LPF 0-5 (*)    All other components within normal limits  CBG MONITORING, ED - Abnormal; Notable for the following:    Glucose-Capillary 148 (*)    All other components within normal limits  SALICYLATE LEVEL  ETHANOL    EKG  EKG Interpretation  Date/Time:  Saturday April 13 2017 17:39:25 EDT Ventricular Rate:  90 PR Interval:    QRS Duration: 97 QT Interval:  393 QTC Calculation: 481 R Axis:   54 Text Interpretation:  Sinus rhythm Consider left  atrial enlargement Borderline prolonged QT interval Confirmed by Isla Pence [phone]) on 04/13/2017 6:08:59 PM       Radiology No results found.  Procedures Procedures (including critical care time)  Medications Ordered in ED Medications  0.9 %  sodium chloride infusion ( Intravenous New Bag/Given 04/13/17 2227)  ondansetron (ZOFRAN) injection 4 mg (4 mg Intravenous Given 04/13/17 2227)     Initial Impression / Assessment and Plan / ED Course  I have reviewed the triage vital signs and the nursing notes.  Pertinent labs & imaging results that were available during my care of the patient were reviewed by me and considered in my medical decision making (see chart for details).    Pt's drug screen is positive for mutliple substances.  Pt does have a rx for xanax and adderall which accounts for the +benzo and + amphetamines.  Pt observed for a number of hours and is still quite somnolent.  Pt is breathing well, so he did not require any more narcan.  Pt will be signed out to Dr. Christy Gentles.  I anticipate d/c when he wakes up.  Final Clinical Impressions(s) / ED Diagnoses   Final diagnoses:  Polysubstance abuse  Accidental drug overdose, initial encounter    New Prescriptions New Prescriptions   No medications on file     Isla Pence, MD 04/14/17 [phone]

## 2017-04-13 NOTE — ED Notes (Signed)
Pt refused in/out cath, EDP made aware.

## 2017-04-13 NOTE — ED Notes (Signed)
Significant other at bedside.

## 2017-04-13 NOTE — ED Triage Notes (Signed)
He was found at [place] local park parking lot in his car unresponsive and EMS were notified. Fire arrived to find pt. Unresponsive with r.r. Of ~4 per min.; and they administered 2mg  of intranasal Narcan with resultant return of orientation and normal resp. Rate and drive. He arrives here in no distress. His father, who was at scene upon the arrival of [facility] Paramedics told them that pt. "has a known problem with 'oxy's'".

## 2017-04-13 NOTE — ED Notes (Signed)
Pt informed that pt urine is needed for UA, he is not able now but will try again.

## 2017-04-13 NOTE — ED Notes (Signed)
Pt is aware a urine sample is needed but is unable to provide one at this time.

## 2017-04-14 MED ORDER — IBUPROFEN 200 MG PO TABS
600.0000 mg | ORAL_TABLET | Freq: Once | ORAL | Status: AC
  Administered 2017-04-14: 600 mg via ORAL
  Filled 2017-04-14: qty 3

## 2017-04-14 NOTE — Discharge Instructions (Signed)
Substance Abuse Treatment Programs ° °Intensive Outpatient Programs °[facility]     °[address]      °[place], [place]                   °[phone]      ° °[facility] °[address] °[place], [place] °[phone] ° °[facility]     °(Inpatient and outpatient)     °[address]           °[phone]   ° °[facility] °[phone] (Suboxone and Methadone) ° °[address]      °[place], [place] [address]      °[phone]      ° °[address] °[place], [place] °852-3033 ° °[facility] (Outpatient/Inpatient, Chemical)    °(insurance only) [phone]      °       °[facility] (Groups & Residential) °[place], [place] °[phone] ° °   °[facility]     °[address]     °[place], [place]      °[phone]      ° °[facility] (for caregivers and family) °[address] °[place], [place] °336-299-4655 ° ° ° ° ° °Residential Treatment Programs °[facility]      °[address], [place], [place] [address]  °[phone]      ° °[facility] °[address] [place], [place] [address] °[phone] ° °[facility]        °[phone]      ° °[facility] °[phone] ° °[facility]             °[address]                                         °[place], [place]                                                °[phone] or [phone]                              ° °[facility] [place] °[address] °[place], [address] °[phone] ° °[facility]    °[address]      °[place], [place]     °[phone]      ° °[facility] °[address] °[place], [place] °[phone] ° °[facility]   °[address]     °[place], [place] [address]     °[phone]      °Admissions: 8am-3pm M-F ° °[facility] °[address] °[place],  [place] °336-227-7417 ° °[facility]: Residential Program (90 Days)   °[place], [place]      °[phone] or [phone]    ° °[facility] °[place], [place] °(Walk in Hours over the weekend or by referral) ° °[facility] °[address], [place],  [address] °[phone] ° °Crisis Mobile: [facility]:  1-877-626-1772 (for crisis response 24 hours a day) °[facility] Hotline:      1-800-256-2452 °Outpatient Psychiatry and Counseling ° °[facility]: Mobile Crisis   Management 24 hours:  1-877-626-1772 ° °[facility] sliding scale fee and walk in schedule: M-F 8am-12pm/1pm-3pm °[address]  °[place], [place] [address] °[phone] ° °[facility] °[address] °[place], [place] [address] °[phone] ° °[facility] ([facility] known [facility])- new patient walk-in appointments available Monday - Friday 8am -3pm.          °[address] °[place], [place] [address] °336-676-6840 or crisis line- 336-676-6905 ° °[facility]/ Intensive Outpatient Therapy Program °[address] °[place], [place] [address] °336-832-9804 ° °[facility]                  °Crisis Services      °[phone]      °[address]     °[place], [place] [address]                ° °[facility]   °[facility] °[phone] °[address] °[place], [place] [address] ° ° °[facility]          °[address]  °[place], [place] [address]       °[phone] ° °[facility] °[address] °[place], [place] [address] °336-292-1510 ° °[facility]    °[address]    °[place], [place] [address]     °[phone]      ° °Parish McKinney, MD     °[address]     °[place] [address]     °[phone]     °  °[facility] °[address] °[place] [address] ° °[facility]     °[address]     °[place], [place]      °336-542-2076      ° °[facility] °Shamsher Ahluwalia, MD °[address] °[place], [place] [address] °[phone] ° °[facility]     °[address]     °[place], [place] [address]     °336-274-1237      ° °[facility] °[address] °[place], [place] [address] °336-854-4450 °Resources for Temporary Residential Assistance/Crisis Centers ° °DAY CENTERS °[facility] °M-F 8am-3pm   °[address] [place], [place] [address]   336-332-0824 °Services include: laundry, barbering, support groups, case management, phone  & computer access, showers, AA/NA mtgs, mental health/substance abuse nurse, job skills class, disability information, VA assistance, spiritual classes, etc.  ° °HOMELESS SHELTERS ° °[facility]     °[facility]   °[address], [place]     °[phone]       °       °[facility] (women and children)       °[address] °[place], [place] [address] °336-275-0820 °[email or web address] for application and process °Application Required ° °[facility]   °[address]    °[place] [address]     °[phone]       °             °[facility] °[address] °[place], [place] [address] °[phone] °336-235-0363(schedule application appt.) °Application Required ° °[facility] (women only)    °[address]     °[place], [place] [address]     °336-884-1039      °  Intake starts 6pm daily °Need valid ID, SSC, & Police report °[facility] [place] °[address] °[place], [place] °336-881-5420 °Application Required ° °[facility] (men only)     °[address]      °[place], [place]     °[phone]      ° °[facility] °(Pregnant women only) °[address] °[place], [place] °336-275-0206 ° °[facility]      °[address]      °[place], [place] [address]     °[phone]      °       °[facility] °[address] °[place], [place] °336-723-1848 °90 day commitment/SA/Application process ° °[facility](men only)     °[address]     °[place], [place]     °[phone]       °Check-in at 7pm     °       °[facility] °[address] °[place], [place] [address] °336-248-6684 °Men/Women/Women and Children must be there by 7 pm ° °[facility] °[place], [place] °336-722-8721                ° °

## 2017-04-14 NOTE — ED Provider Notes (Signed)
Pt improved He is ambulatory He is taking PO He reports snorting pain medications Denies IVDA He is interested in outpatient treatment for opiate abuse Advised that he could die if he continues abusing pain meds Wife at bedside during whole conversation and pt was agreeable with having her in room  Outpatient resources given    [name], MD 04/14/17 0246

## 2017-07-02 ENCOUNTER — Emergency Department (HOSPITAL_COMMUNITY): Admission: EM | Admit: 2017-07-02 | Discharge: 2017-07-02 | Discharge status: Absent without leave | Payer: Self-pay

## 2017-07-02 LAB — BPAM FFP
Blood Product Expiration Date: 201809192359
Blood Product Expiration Date: 201809222359
ISSUE DATE / TIME: 201809180745
ISSUE DATE / TIME: 201809180745
UNIT TYPE AND RH: 6200
Unit Type and Rh: 6200

## 2017-07-02 LAB — PREPARE FRESH FROZEN PLASMA
UNIT DIVISION: 0
Unit division: 0

## 2017-07-02 LAB — BPAM RBC
BLOOD PRODUCT EXPIRATION DATE: 201809282359
BLOOD PRODUCT EXPIRATION DATE: 201810082359
ISSUE DATE / TIME: 201809180744
ISSUE DATE / TIME: 201809180744
UNIT TYPE AND RH: 9500
Unit Type and Rh: 9500

## 2017-07-15 DEATH — deceased
